# Patient Record
Sex: Female | Born: 2000
Health system: Southern US, Community
[De-identification: ages and names within clinical notes are randomized; demographics above are authoritative.]

## PROBLEM LIST (undated history)

## (undated) ENCOUNTER — Inpatient Hospital Stay (HOSPITAL_COMMUNITY): Payer: Commercial Managed Care - PPO

## (undated) DIAGNOSIS — B009 Herpesviral infection, unspecified: Secondary | ICD-10-CM

## (undated) DIAGNOSIS — F4320 Adjustment disorder, unspecified: Secondary | ICD-10-CM

## (undated) DIAGNOSIS — E109 Type 1 diabetes mellitus without complications: Secondary | ICD-10-CM

## (undated) HISTORY — DX: Herpesviral infection, unspecified: B00.9

## (undated) HISTORY — DX: Type 1 diabetes mellitus without complications: E10.9

## (undated) HISTORY — PX: WISDOM TOOTH EXTRACTION: SHX21

---
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
## 2019-01-01 DIAGNOSIS — F322 Major depressive disorder, single episode, severe without psychotic features: Secondary | ICD-10-CM | POA: Diagnosis not present

## 2019-01-01 LAB — GLUCOSE, CAPILLARY
Glucose-Capillary: 127 mg/dL — ABNORMAL HIGH (ref 70–99)
Glucose-Capillary: 220 mg/dL — ABNORMAL HIGH (ref 70–99)
Glucose-Capillary: 96 mg/dL (ref 70–99)
Glucose-Capillary: 90 mg/dL (ref 70–99)
Glucose-Capillary: 99 mg/dL (ref 70–99)

## 2019-01-01 MED ORDER — NICOTINE POLACRILEX 2 MG MT GUM
2.0000 mg | CHEWING_GUM | OROMUCOSAL | Status: DC | PRN
  Administered 2019-01-01 – 2019-01-02 (×4): 2 mg via ORAL
  Filled 2019-01-01: qty 1

## 2019-01-01 NOTE — Progress Notes (Addendum)
Patient ID: Lydia Estrada, female   DOB: 15-Dec-2000, 18 y.o.   MRN: [id number] Pt refilled insulin pump with  200 unit humalog pen cbg am 220 New pump placed on left arm

## 2019-01-01 NOTE — Progress Notes (Signed)
Patient's record reviewed remotely. Your control is much better. No hypoglycemic episodes documented. Continue current regimen of insulin therapy, and continue calorie count. Further management will depend on above.

## 2019-01-01 NOTE — [facility] Group Notes (Signed)
LCSW Group Therapy Note  01/01/2019    10:00-11:00am   Type of Therapy and Topic:  Group Therapy: Anger and Coping Skills  Participation Level:  Did Not Attend   Description of Group:   In this group, patients learned how to recognize the physical, cognitive, emotional, and behavioral responses they have to anger-provoking situations.  They identified how they usually or often react when angered, and learned how healthy and unhealthy coping skills work initially, but the unhealthy ones stop working.   They analyzed how their frequently-chosen coping skill is possibly beneficial and how it is possibly unhelpful.  The group discussed a variety of healthier coping skills that could help in resolving the actual issues, as well as how to go about planning for the the possibility of future similar situations.  Therapeutic Goals: 1. Patients will identify one thing that makes them angry and how they feel emotionally and physically, what their thoughts are or tend to be in those situations, and what healthy or unhealthy coping mechanism they typically use 2. Patients will identify how their coping technique works for them, as well as how it works against them. 3. Patients will explore possible new behaviors to use in future anger situations. 4. Patients will learn that anger itself is normal and cannot be eliminated, and that healthier coping skills can assist with resolving conflict rather than worsening situations.  Summary of Patient Progress:  The patient was invited to attend group, but chose not to [place].  Therapeutic Modalities:   Cognitive Behavioral Therapy Motivation Interviewing  Maretta Los  .
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
## 2019-01-02 LAB — GLUCOSE, CAPILLARY
Glucose-Capillary: 187 mg/dL — ABNORMAL HIGH (ref 70–99)
Glucose-Capillary: 106 mg/dL — ABNORMAL HIGH (ref 70–99)
Glucose-Capillary: 55 mg/dL — ABNORMAL LOW (ref 70–99)
Glucose-Capillary: 103 mg/dL — ABNORMAL HIGH (ref 70–99)
Glucose-Capillary: 202 mg/dL — ABNORMAL HIGH (ref 70–99)
Glucose-Capillary: 136 mg/dL — ABNORMAL HIGH (ref 70–99)

## 2019-01-02 NOTE — Progress Notes (Signed)
   01/02/19 0023  Psych Admission Type (Psych Patients Only)  Admission Status Voluntary  Psychosocial Assessment  Patient Complaints Anxiety  Eye Contact Brief  Facial Expression Anxious  Affect Appropriate to circumstance  Speech Logical/coherent  Interaction Assertive;Defensive  Motor Activity Slow  Appearance/Hygiene Unremarkable  Behavior Characteristics Cooperative  Mood Anxious  Aggressive Behavior  Effect No apparent injury  Thought Process  Coherency WDL  Content WDL  Delusions None reported or observed  Perception WDL  Hallucination None reported or observed  Judgment Impaired  Confusion None  Danger to Self  Current suicidal ideation? Denies  Self-Injurious Behavior No self-injurious ideation or behavior indicators observed or expressed   Agreement Not to Harm Self Yes  Description of Agreement Verbally contracts for safety  Danger to Others  Danger to Others None reported or observed  D: Patient in dayroom reports she had a good day.  A: Medications administered as prescribed. Support and encouragement provided as needed.  R: Patient remains safe on the unit. Will continue to monitor for safety and stability.

## 2019-01-02 NOTE — Progress Notes (Addendum)
   01/02/19 1800  Psych Admission Type (Psych Patients Only)  Admission Status Voluntary  Psychosocial Assessment  Patient Complaints Anxiety  Eye Contact Brief  Facial Expression Anxious  Affect Appropriate to circumstance  Speech Logical/coherent  Interaction Assertive  Motor Activity Slow  Appearance/Hygiene Unremarkable  Behavior Characteristics Cooperative  Mood Pleasant  Aggressive Behavior  Effect No apparent injury  Thought Process  Coherency WDL  Content WDL  Delusions None reported or observed  Perception WDL  Hallucination None reported or observed  Judgment Impaired  Confusion None  Danger to Self  Current suicidal ideation? Denies  Self-Injurious Behavior No self-injurious ideation or behavior indicators observed or expressed   Agreement Not to Harm Self Yes  Description of Agreement Verbally contracts for safety  Danger to Others  Danger to Others None reported or observed
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
## 2019-01-02 NOTE — Progress Notes (Signed)
Psychoeducational Group Note  Date:  01/02/2019 Time: 2030 Group Topic/Focus:  wrap up group  Participation Level: Did Not Attend  Participation Quality:  Not Applicable  Affect:  Not Applicable  Cognitive:  Not Applicable  Insight:  Not Applicable  Engagement in Group: Not Applicable  Additional Comments: Pt left group shortly after it began. Pt reported only wanting to sit in group and observe. Pt was encouraged to minimally participate but pt left group and returned to her room.   Winfield Rast S 01/02/2019, 10:03 PM

## 2019-01-02 NOTE — [facility]HH Group Notes (Signed)
[place] LCSW Group Therapy Note  01/02/2019  10:00-11:00AM  Type of Therapy and Topic:  Group Therapy:  A Hero Worthy of Support  Participation Level:  Did Not Attend   Description of Group:  Patients in this group were introduced to the concept that additional supports including self-support are an essential part of recovery.  Matching needs with supports to help fulfill those needs was explained.  A song "I Got To Live" was played for the group and was followed by a discussion of what it meant to participants.   The consensus was that the message was to give themselves permission to see happiness in life.  A song entitled "My Own Hero" was played and a group discussion ensued in which patients stated it inspired them to help themselves in order to succeed, because other people cannot achieve their goals such as sobriety or stability for them.  A song was played called "I Am Enough" which led to a discussion about being willing to believe we are worth the effort of being a self-support.   Therapeutic Goals: 1)  demonstrate the importance of being a key part of one's own support system 2)  discuss various available supports 3)  encourage patient to use music as part of their self-support and focus on goals 4)  elicit ideas from patients about supports that need to be added   Summary of Patient Progress:  The patient was invited to attend, chose not to do so.  Therapeutic Modalities:   Motivational Interviewing Activity  Maretta Los

## 2019-01-02 NOTE — Progress Notes (Signed)
Patient's chart reviewed. Patient is being followed peripherally.   Blood sugar is well controlled. Continue current regimen.
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
## 2019-01-02 NOTE — Progress Notes (Signed)
Hypoglycemic Event  CBG: 50  Treatment: 8 oz juice/soda  Symptoms: Hungry and cold  Follow-up CBG: Time:1215 CBG Result:103    Waymond Cera

## 2019-01-03 LAB — GLUCOSE, CAPILLARY
Glucose-Capillary: 32 mg/dL — CL (ref 70–99)
Glucose-Capillary: 40 mg/dL — CL (ref 70–99)
Glucose-Capillary: 132 mg/dL — ABNORMAL HIGH (ref 70–99)
Glucose-Capillary: 165 mg/dL — ABNORMAL HIGH (ref 70–99)
Glucose-Capillary: 211 mg/dL — ABNORMAL HIGH (ref 70–99)
Glucose-Capillary: 228 mg/dL — ABNORMAL HIGH (ref 70–99)

## 2019-01-03 MED ORDER — INSULIN GLARGINE 100 UNIT/ML ~~LOC~~ SOLN: 18.0000 [IU] | Freq: Every day | SUBCUTANEOUS | Status: DC

## 2019-01-03 MED ORDER — GLUCERNA SHAKE PO LIQD
237.0000 mL | Freq: Three times a day (TID) | ORAL | Status: DC
  Administered 2019-01-03: 16:00:00 237 mL via ORAL

## 2019-01-03 MED ORDER — NICOTINE POLACRILEX 2 MG MT GUM: 2.0000 mg | CHEWING_GUM | OROMUCOSAL | Status: DC | PRN

## 2019-01-03 MED ORDER — GLUCOSE 40 % PO GEL
1.0000 | Freq: Once | ORAL | Status: AC
  Administered 2019-01-03: 07:00:00 37.5 g via ORAL
  Filled 2019-01-03: qty 1

## 2019-01-03 MED ORDER — GLUCOSE 40 % PO GEL
ORAL | Status: AC
  Administered 2019-01-03: 06:00:00
  Filled 2019-01-03: qty 1

## 2019-01-03 MED ORDER — NICOTINE 21 MG/24HR TD PT24
21.0000 mg | MEDICATED_PATCH | Freq: Every day | TRANSDERMAL | Status: DC
  Administered 2019-01-03 – 2019-01-04 (×2): 21 mg via TRANSDERMAL
  Filled 2019-01-03 (×4): qty 1

## 2019-01-03 MED ORDER — GLUCAGON HCL RDNA (DIAGNOSTIC) 1 MG IJ SOLR
INTRAMUSCULAR | Status: AC
  Filled 2019-01-03: qty 1

## 2019-01-03 MED ORDER — GLUCAGON HCL RDNA (DIAGNOSTIC) 1 MG IJ SOLR
1.0000 mg | Freq: Once | INTRAMUSCULAR | Status: DC
  Filled 2019-01-03: qty 1

## 2019-01-03 NOTE — Progress Notes (Signed)
Recreation Therapy Notes  Date:  11.2.20 Time: 0930 Location: 300 Hall Group Room  Group Topic: Stress Management  Goal Area(s) Addresses:  Patient will identify positive stress management techniques. Patient will identify benefits of using stress management post d/c.  Intervention: Stress Management  Activity :  Meditation.  LRT introduced the stress management technique of meditation.  LRT played a meditation that focused on making the most of your day.  Patients were to listen and follow along as meditation was played to fully engage in activity.  Education:  Stress Management, Discharge Planning.   Education Outcome: Acknowledges Education  Clinical Observations/Feedback:  Patient did not attend group.    Victorino Sparrow, LRT/CTRS         Ria Comment, Hadlea Furuya A 01/03/2019 11:30 AM

## 2019-01-03 NOTE — Progress Notes (Signed)
The patient was redirected for entering the wrong bedroom (Room 301) in order to talk. The patient had no response.

## 2019-01-03 NOTE — Tx Team (Signed)
Interdisciplinary Treatment and Diagnostic Plan Update  01/03/2019 Time of Session: 9:20am Lydia Estrada MRN: [id number]  Principal Diagnosis: Type 1 diabetes mellitus without complication (HCC)  Secondary Diagnoses: Principal Problem:   Type 1 diabetes mellitus without complication (HCC) Active Problems:   Severe recurrent major depression without psychotic features (HCC)   Current Medications:  Current Facility-Administered Medications  Medication Dose Route Frequency Provider Last Rate Last Dose  . acetaminophen (TYLENOL) tablet 650 mg  650 mg Oral Q6H PRN Anike, Adaku C, NP   650 mg at 12/31/18 0915  . glucagon (human recombinant) (GLUCAGEN) injection 1 mg  1 mg Subcutaneous Once Nira Conn A, NP      . insulin glargine (LANTUS) injection 24 Units  24 Units Subcutaneous Daily Berton Mount I, MD   24 Units at 01/03/19 [phone]  . Insulin Lispro SOPN 1 pen  1 pen Subcutaneous Once Cobos, Fernando A, MD      . insulin pump   Subcutaneous TID AC & HS Berton Mount I, MD      . LORazepam (ATIVAN) tablet 0.5 mg  0.5 mg Oral Q6H PRN Cobos, Rockey Situ, MD   0.5 mg at 01/03/19 0819  . nicotine (NICODERM CQ - dosed in mg/24 hours) patch 21 mg  21 mg Transdermal Daily Cobos, Rockey Situ, MD      . norgestrel-ethinyl estradiol (LO/OVRAL) 0.3-30 MG-MCG per tablet 1 tablet  1 tablet Oral QHS Cobos, Rockey Situ, MD   1 tablet at 01/02/19 2154  . sertraline (ZOLOFT) tablet 50 mg  50 mg Oral Daily Cobos, Rockey Situ, MD   50 mg at 01/03/19 5784  . traZODone (DESYREL) tablet 50 mg  50 mg Oral QHS PRN Cobos, Rockey Situ, MD   50 mg at 01/02/19 2153   PTA Medications: Medications Prior to Admission  Medication Sig Dispense Refill Last Dose  . acetone, urine, test strip Check ketones per protocol 50 each 3   . Blood Glucose Monitoring Suppl (FREESTYLE LITE) DEVI USE BLOOD GLUCOSE METER TO CHECK BLOOD GLUCOSE 10 TIMES DAY 2 each 2   . ELINEST 0.3-30 MG-MCG tablet Take 0.3 mg by mouth at bedtime.      Marland Kitchen EPINEPHrine 0.3 mg/0.3 mL IJ SOAJ injection Inject 0.3 mg into the muscle once as needed for allergies.     Marland Kitchen glucagon 1 MG injection Follow package directions for low blood sugar. 2 each 6   . glucose blood (FREESTYLE LITE) test strip USE TO TEST BLOOD SUGAR 6 TIMES PER DAY. 200 strip 5   . HUMALOG KWIKPEN 200 UNIT/ML SOPN Inject 200 Units into the skin daily. 30 mL 6   . Lancets (FREESTYLE) lancets USE TO TEST BLOOD SUGAR 6 TIMES PER DAY. 200 each 5   . LANTUS SOLOSTAR 100 UNIT/ML Solostar Pen Inject up to 75 units daily per MD order. (Patient taking differently: 61 Units. ) 30 mL 11   . metFORMIN (GLUCOPHAGE-XR) 500 MG 24 hr tablet Take 3 tablets (1,500 mg total) by mouth daily with breakfast. 93 tablet 6     Patient Stressors: Health problems Medication change or noncompliance  Patient Strengths: Average or above average intelligence Communication skills Supportive family/friends  Treatment Modalities: Medication Management, Group therapy, Case management,  1 to 1 session with clinician, Psychoeducation, Recreational therapy.   Physician Treatment Plan for Primary Diagnosis: Type 1 diabetes mellitus without complication (HCC) Long Term Goal(s): Improvement in symptoms so as ready for discharge Improvement in symptoms so as ready for discharge  Short Term Goals: Ability to identify changes in lifestyle to reduce recurrence of condition will improve Ability to verbalize feelings will improve Ability to disclose and discuss suicidal ideas Ability to demonstrate self-control will improve Ability to identify and develop effective coping behaviors will improve Ability to maintain clinical measurements within normal limits will improve Ability to identify changes in lifestyle to reduce recurrence of condition will improve Ability to verbalize feelings will improve Ability to disclose and discuss suicidal ideas Ability to demonstrate self-control will improve Ability to identify  and develop effective coping behaviors will improve Ability to maintain clinical measurements within normal limits will improve  Medication Management: Evaluate patient's response, side effects, and tolerance of medication regimen.  Therapeutic Interventions: 1 to 1 sessions, Unit Group sessions and Medication administration.  Evaluation of Outcomes: Progressing  Physician Treatment Plan for Secondary Diagnosis: Principal Problem:   Type 1 diabetes mellitus without complication ([place]) Active Problems:   Severe recurrent major depression without psychotic features ([place])  Long Term Goal(s): Improvement in symptoms so as ready for discharge Improvement in symptoms so as ready for discharge   Short Term Goals: Ability to identify changes in lifestyle to reduce recurrence of condition will improve Ability to verbalize feelings will improve Ability to disclose and discuss suicidal ideas Ability to demonstrate self-control will improve Ability to identify and develop effective coping behaviors will improve Ability to maintain clinical measurements within normal limits will improve Ability to identify changes in lifestyle to reduce recurrence of condition will improve Ability to verbalize feelings will improve Ability to disclose and discuss suicidal ideas Ability to demonstrate self-control will improve Ability to identify and develop effective coping behaviors will improve Ability to maintain clinical measurements within normal limits will improve     Medication Management: Evaluate patient's response, side effects, and tolerance of medication regimen.  Therapeutic Interventions: 1 to 1 sessions, Unit Group sessions and Medication administration.  Evaluation of Outcomes: Progressing   RN Treatment Plan for Primary Diagnosis: Type 1 diabetes mellitus without complication ([place]) Long Term Goal(s): Knowledge of disease and therapeutic regimen to maintain health will improve  Short Term  Goals: Ability to participate in decision making will improve, Ability to disclose and discuss suicidal ideas, Ability to identify and develop effective coping behaviors will improve and Compliance with prescribed medications will improve  Medication Management: RN will administer medications as ordered by provider, will assess and evaluate patient's response and provide education to patient for prescribed medication. RN will report any adverse and/or side effects to prescribing provider.  Therapeutic Interventions: 1 on 1 counseling sessions, Psychoeducation, Medication administration, Evaluate responses to treatment, Monitor vital signs and CBGs as ordered, Perform/monitor CIWA, COWS, AIMS and Fall Risk screenings as ordered, Perform wound care treatments as ordered.  Evaluation of Outcomes: Progressing   LCSW Treatment Plan for Primary Diagnosis: Type 1 diabetes mellitus without complication ([place]) Long Term Goal(s): Safe transition to appropriate next level of care at discharge, Engage patient in therapeutic group addressing interpersonal concerns.  Short Term Goals: Engage patient in aftercare planning with referrals and resources  Therapeutic Interventions: Assess for all discharge needs, 1 to 1 time with Social worker, Explore available resources and support systems, Assess for adequacy in community support network, Educate family and significant other(s) on suicide prevention, Complete Psychosocial Assessment, Interpersonal group therapy.  Evaluation of Outcomes: Progressing   Progress in Treatment: Attending groups: Yes. Participating in groups: Yes. Taking medication as prescribed: Yes. Toleration medication: Yes. Family/Significant other contact made: Yes, individual(s) contacted:  the patient's mother Patient understands diagnosis: Yes. Discussing patient identified problems/goals with staff: Yes. Medical problems stabilized or resolved: Yes. Denies suicidal/homicidal ideation:  Yes.  Issues/concerns per patient self-inventory: No. Other:   New problem(s) identified: None   New Short Term/Long Term Goal(s):medication stabilization, elimination of SI thoughts, development of comprehensive mental wellness plan.    Patient Goals: "To find better ways of coping. It has been hard dealing with things"   Discharge Plan or Barriers: Patient recently admitted. CSW will continue to follow and assess for appropriate referrals and possible discharge planning.    Reason for Continuation of Hospitalization: Depression Medication stabilization Suicidal ideation  Estimated Length of Stay: 1-2 days   Attendees: Patient: Lydia Estrada  01/03/2019 9:26 AM  Physician: Dr. Nehemiah MassedFernando Cobos, MD 01/03/2019 9:26 AM  Nursing: Meriam SpragueBeverly.Kirtland BouchardK, RN 01/03/2019 9:26 AM  RN Care Manager: 01/03/2019 9:26 AM  Social Worker: Baldo DaubJolan Mary Hockey, LCSW 01/03/2019 9:26 AM  Recreational Therapist:  01/03/2019 9:26 AM  Other: Marciano SequinJanet Sykes, NP  01/03/2019 9:26 AM  Other:  01/03/2019 9:26 AM  Other: 01/03/2019 9:26 AM    Scribe for Treatment Team: Maeola SarahJolan E Norlan Rann, LCSWA 01/03/2019 9:26 AM

## 2019-01-03 NOTE — Progress Notes (Addendum)
Patient records reviewed. Blood sugar was 55 at 12.02 PM yesterday.   Discussed with psychiatrist, Dr. Parke Poisson.  Continue to monitor caloric count. Blood sugar of 32 noted around 6.05 AM today. Please continue to check blood sugar at 2 AM even if no coverage is planned. Decrease a.m. Lantus to 18 units once daily. Continue to monitor blood sugar closely, as well as, caloric count. Please document caloric count in patient's chart Further management depend on above.  Addendum: Addendum is done on 01/03/2019 at 2:49 PM.  After discussing with the patient's primary attending a psychiatrist, Dr. Parke Poisson, will agree to discontinue the extra subcutaneous Lantus insulin.  Patient gets basal insulin via the insulin pump.

## 2019-01-03 NOTE — Progress Notes (Addendum)
1700  Patient ate salad, 6 carbs;  Pasta 11 carbs;   Apple juice 8 carbs,   Diet dr.pepper zero carbs;   1% low fat chocolate milk 24 carbs;  carbs total 39.   Total carbs 39 + 9.75   CBG 211 = 2.3 Total 12.05 units

## 2019-01-03 NOTE — Progress Notes (Signed)
The patient attended group but did not feel comfortable with sharing.

## 2019-01-03 NOTE — Progress Notes (Addendum)
Inpatient Diabetes Program Recommendations  AACE/ADA: New Consensus Statement on Inpatient Glycemic Control (2015)  Target Ranges:  Prepandial:   less than 140 mg/dL      Peak postprandial:   less than 180 mg/dL (1-2 hours)      Critically ill patients:  140 - 180 mg/dL   Results for Lydia Estrada, Lydia Estrada (MRN [id number]) as of 01/03/2019 07:14  Ref. Range 01/02/2019 02:45 01/02/2019 06:15 01/02/2019 12:02 01/02/2019 12:34 01/02/2019 16:57 01/02/2019 20:45  Glucose-Capillary Latest Ref Range: 70 - 99 mg/dL 187 (H) 106 (H)   24 units LANTUS given at 8am 55 (L) 103 (H) 202 (H)  7.05 units given per Insulin Pump 136 (H)  3.85 units given per Insulin Pump   Results for Lydia Estrada, Lydia Estrada (MRN [id number]) as of 01/03/2019 07:14  Ref. Range 01/03/2019 06:05 01/03/2019 06:17 01/03/2019 06:36  Glucose-Capillary Latest Ref Range: 70 - 99 mg/dL 32 (LL) 40 (LL) 132 (H)     Home DM Meds: Omni Pod Insulin Pump  Current Orders: Omni Pod Insulin Pump      Lantus 24 units Daily      MD- Patient with Severe Hypoglycemia this AM.   Please reduce Lantus the Lantus to 10 units Daily.   May want to call Dr. Loren Racer office (Peds ENDO) and ask for further recommendations.    --Will follow patient during hospitalization--  Wyn Quaker RN, MSN, CDE Diabetes Coordinator Inpatient Glycemic Control Team Team Pager: [phone] (8a-5p)

## 2019-01-03 NOTE — Progress Notes (Signed)
Nutrition Note  48 hour Calorie Count ordered per MD 10/30. Pt with type 1 diabetes with recurrent hypoglycemic episodes.  RD unable to calculate calorie counts, no access to PO documentation. RD working remotely given COVID-19 restrictions.  If staff can document in flowsheets, can calculate from that documentation. Will also order Glucerna shakes if patient is not completing meals.  Weights have been trending down.   Wt Readings from Last 15 Encounters:  12/28/18 (!) 149.7 kg (>99 %, Z= 2.89)*  11/24/18 (!) 151.5 kg (>99 %, Z= 2.89)*  08/27/18 (!) 151.5 kg (>99 %, Z= 2.87)*  05/21/18 (!) 147.8 kg (>99 %, Z= 2.82)*  02/04/18 (!) 141.5 kg (>99 %, Z= 2.76)*  10/22/17 (!) 150.5 kg (>99 %, Z= 2.82)*  09/29/17 (!) 154.9 kg (>99 %, Z= 2.84)*  09/18/17 (!) 152.9 kg (>99 %, Z= 2.83)*  07/16/17 (!) 153.3 kg (>99 %, Z= 2.83)*  05/14/17 (!) 156 kg (>99 %, Z= 2.85)*  05/14/17 (!) 156 kg (>99 %, Z= 2.85)*  04/13/17 (!) 160.1 kg (>99 %, Z= 2.88)*  04/13/17 (!) 160.1 kg (>99 %, Z= 2.88)*  04/01/17 (!) 167.8 kg (>99 %, Z= 2.92)*   * Growth percentiles are based on CDC (Girls, 2-20 Years) data.    Body mass index is 50.18 kg/m. Patient meets criteria for obesity based on current BMI for age. Labs and medications reviewed.   Clayton Bibles, MS, RD, LDN Inpatient Clinical Dietitian Pager: [phone] After Hours Pager: [phone]

## 2019-01-03 NOTE — Progress Notes (Signed)
Spiritual care group on grief and loss facilitated by chaplain Jerene Pitch MDiv, [facility]  Group Goal:  Support / Education around grief and loss Members engage in facilitated group support and psycho-social education.  Group Description:  Following introductions and group rules, group members engaged in facilitated group dialog and support around topic of loss, with particular support around experiences of loss in their lives. Group Identified types of loss (relationships / self / things) and identified patterns, circumstances, and changes that precipitate losses. Reflected on thoughts / feelings around loss, normalized grief responses, and recognized variety in grief experience.   Group noted Worden's four tasks of grief in discussion.  Group drew on Adlerian / Rogerian, narrative MI, Patient Progress:  Celestial arrived to group late.  Was engaged in group discussion.  Noted how her experience growing up with 7 brothers affects her internal responses to grief.  She states she feels she is supposed to be "tough" and spoke with group about her awareness of this narrative and her work to recognize and speak back to this narrative.    Jerene Pitch, MDiv, [facility]

## 2019-01-03 NOTE — [facility] Group Notes (Signed)
LCSW Group Therapy Notes 01/03/2019 2:20 PM  Type of Therapy and Topic: Group Therapy: Overcoming Obstacles  Participation Level: Did Not Attend  Description of Group:  In this group patients will be encouraged to explore what they see as obstacles to their own wellness and recovery. They will be guided to discuss their thoughts, feelings, and behaviors related to these obstacles. The group will process together ways to cope with barriers, with attention given to specific choices patients can make. Each patient will be challenged to identify changes they are motivated to make in order to overcome their obstacles. This group will be process-oriented, with patients participating in exploration of their own experiences as well as giving and receiving support and challenge from other group members.  Therapeutic Goals: 1. Patient will identify personal and current obstacles as they relate to admission. 2. Patient will identify barriers that currently interfere with their wellness or overcoming obstacles.  3. Patient will identify feelings, thought process and behaviors related to these barriers. 4. Patient will identify two changes they are willing to make to overcome these obstacles:   Summary of Patient Progress N/A, patient was meeting with CSW and psychiatry  Therapeutic Modalities:  Cognitive Behavioral Therapy Solution Focused Therapy Motivational Interviewing Relapse Prevention Therapy  Stephanie Acre, MSW, [facility] 01/03/2019 2:20 PM
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
## 2019-01-04 DIAGNOSIS — F332 Major depressive disorder, recurrent severe without psychotic features: Principal | ICD-10-CM

## 2019-01-04 DIAGNOSIS — E109 Type 1 diabetes mellitus without complications: Secondary | ICD-10-CM

## 2019-01-04 LAB — GLUCOSE, CAPILLARY
Glucose-Capillary: 224 mg/dL — ABNORMAL HIGH (ref 70–99)
Glucose-Capillary: 57 mg/dL — ABNORMAL LOW (ref 70–99)
Glucose-Capillary: 69 mg/dL — ABNORMAL LOW (ref 70–99)
Glucose-Capillary: 162 mg/dL — ABNORMAL HIGH (ref 70–99)
Glucose-Capillary: 56 mg/dL — ABNORMAL LOW (ref 70–99)

## 2019-01-04 MED ORDER — HYDROXYZINE HCL 25 MG PO TABS: 25.0000 mg | ORAL_TABLET | Freq: Three times a day (TID) | ORAL | 0 refills | Status: DC | PRN

## 2019-01-04 MED ORDER — TRAZODONE HCL 50 MG PO TABS: 50.0000 mg | ORAL_TABLET | Freq: Every evening | ORAL | 0 refills | Status: DC | PRN

## 2019-01-04 MED ORDER — HYDROXYZINE HCL 25 MG PO TABS: 25.0000 mg | ORAL_TABLET | Freq: Three times a day (TID) | ORAL | Status: DC | PRN

## 2019-01-04 MED ORDER — NICOTINE 21 MG/24HR TD PT24: 21.0000 mg | MEDICATED_PATCH | Freq: Every day | TRANSDERMAL | 0 refills | Status: DC

## 2019-01-04 MED ORDER — SERTRALINE HCL 50 MG PO TABS: 50.0000 mg | ORAL_TABLET | Freq: Every day | ORAL | 0 refills | Status: DC

## 2019-01-04 MED ORDER — INSULIN PUMP: 1.0000 | Freq: Three times a day (TID) | SUBCUTANEOUS | Status: DC

## 2019-01-04 MED FILL — traZODone HCL 50 MG TABS: 50 | 30 days supply | Qty: 30 | Fill #0

## 2019-01-04 MED FILL — LANTUS SOLOSTAR 100 UNITS/M: 100 | 40 days supply | Qty: 30 | Fill #7

## 2019-01-04 MED FILL — NICOTINE 21 MG/24HR PATCH: 21 | 28 days supply | Qty: 28 | Fill #0

## 2019-01-04 MED FILL — SERTRALINE HCL 50 MG TABLET: 50 | 30 days supply | Qty: 30 | Fill #0

## 2019-01-04 MED FILL — hydrOXYzine HCL 25 MG TABS: 25 | 20 days supply | Qty: 60 | Fill #0

## 2019-01-04 NOTE — Progress Notes (Signed)
Discharge Note:  Patient discharged home with mother.  Patient denied SI and HI.  Denied A/V hallucinations.  Suicide prevention information given and discussed with patient who stated she understood and had no questions.  Patient stated she appreciated all assistance received from [facility] staff.  Patient stated she received all her belongings, clothing, toiletries, diabetic equipment, etc.  All required discharge information given to patient at discharge.

## 2019-01-04 NOTE — Progress Notes (Signed)
0810  Patient's CBG was 162.  Patient ate breakfast, 15 carbs;   chocolate milk 1/2;  Total carbs 24.  Ate hash browns and 1/2 of chocolate milk.   Pt ate 15 carbs  =  3.75 units CBG 162  =  0.9 units Total 4.65 units

## 2019-01-04 NOTE — Progress Notes (Signed)
1300  Patient ate small salad, chicken and sauce, chocolate milk, total 53 carbs CBG 56 =  Total of 7 units

## 2019-01-04 NOTE — Progress Notes (Signed)
CSW spoke with mother at the patient's request to facilitate discharge planning. Mother, Lydia Estrada [phone]), reports she was expecting a family meeting prior to discharge to be "aware of the plan." CSW shared patient's outpatient follow up appointment with mother.  Mother expressed general concerns for the patient's discharge and behaviors. Mother has set expectations and boundaries for the patient, if she wants to return to the family home. Mother shares that last night, the patient called and stated she was not going to follow the rules and if she was made to follow the rules, she would kill herself. Mother confirms the patient called again this morning and they had a conversation in which the patient agreed to follow the rules and expectations established by her parents.  Mother states that because the patient has called her daily while at [facility], and raised her voice and used derogatory language, that the patient is "still in crisis."   Mother agreed that the patient is able to return home today if the patient is psychiatrically cleared for discharge.   Additional information was shared with psychiatry. This patient is still appropriate for discharge and contracts for safety.  Patient's mother called and informed, mother will pick up the patient for discharge at [place], [place] Worker

## 2019-01-04 NOTE — Progress Notes (Signed)
D::  Patient's self inventory sheet, patient sleeps good, sleep medication helpful.  Fair appetite, normal energy level, poor concentration.  Rated depression 9, hopeless and anxiety 10.  Denied withdrawals.  SI, contracts for safety.  Physical problems.  Headaches.  Physical pain, pain medication is helpful.  Goal is find out where to live.  Plans to talk to MD.  No discharge plans. A:  Medications administered per MD orders.  Emotional support and encouragement given patient. R:  Denied HI, contracts for safety.  Denied A/V hallucinations.  SI sometimes, contracts for safety.
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
## 2019-01-04 NOTE — Progress Notes (Signed)
Inpatient Diabetes Program Recommendations  AACE/ADA: New Consensus Statement on Inpatient Glycemic Control (2015)  Target Ranges:  Prepandial:   less than 140 mg/dL      Peak postprandial:   less than 180 mg/dL (1-2 hours)      Critically ill patients:  140 - 180 mg/dL   Results for Lydia Estrada, Lydia Estrada (MRN [id number]) as of 01/04/2019 06:54  Ref. Range 01/03/2019 06:05 01/03/2019 06:17 01/03/2019 06:36 01/03/2019 11:47 01/03/2019 17:59 01/03/2019 20:37  Glucose-Capillary Latest Ref Range: 70 - 99 mg/dL 32 (LL) 40 (LL) 132 (H)  2.55 units given per Insulin Pump +  24 units LANTUS given at 8am 165 (H)  9.75 units given per Insulin Pump 211 (H)  12.05 nuits given per Insulin Pump 228 (H)   Results for Lydia Estrada, Lydia Estrada (MRN [id number]) as of 01/04/2019 06:54  Ref. Range 01/04/2019 02:06 01/04/2019 06:19 01/04/2019 06:45  Glucose-Capillary Latest Ref Range: 70 - 99 mg/dL 224 (H) 57 (L) 69 (L)    Home DM Meds: Omni Pod Insulin Pump  Current Orders: Omni Pod Insulin Pump      Severe Hypoglycemia yesterday AM.  Hypoglycemia again this AM.    MD -Note that patient received 24 units Lantus yesterday at 8am.    Note that Lantus discontinued late yesterday afternoon and patient will not get any Lantus today.  Agree with d/c of Lantus for now.  If additional Insulin adjustments are needed, may need to contact Lydia Bers, NP with patient's Endocrinology office. Lydia Bers, NP       [facility]      [phone]     --Will follow patient during hospitalization--  Wyn Quaker RN, MSN, CDE Diabetes Coordinator Inpatient Glycemic Control Team Team Pager: [phone] (8a-5p)

## 2019-01-04 NOTE — Progress Notes (Signed)
Patient's blood sugar was 57 this am. Patient was asymptomatic. Her blood sugar was checked at 2am and it was 224. Re-checked at 6:43 and it was 69. Patient will be able to get over 70 by eating her breakfast. On-call notified. Basal rate probably needs adjusting at night. No bolused insulin given this shift

## 2019-01-04 NOTE — Progress Notes (Signed)
Patient ID: Lydia Estrada, female   DOB: [birth year], 18 y.o.   MRN: [id number]  D: Patient pleasant on approach tonight but reports that she has been upset tonight due to her parents telling her that she cannot live there anymore. No active SI at this time. Reports her mood was improving until she was told this. A: Staff will monitor on q 15 minute checks, follow treatment plan, and give medications as ordered. R: Cooperative on the unit

## 2019-01-04 NOTE — BHH Suicide Risk Assessment (Signed)
[facility] Discharge Suicide Risk Assessment   Principal Problem: Type 1 diabetes mellitus without complication ([place]) Discharge Diagnoses: Principal Problem:   Type 1 diabetes mellitus without complication ([place]) Active Problems:   Severe recurrent major depression without psychotic features ([place])   Total Time spent with patient: 20 minutes  Musculoskeletal: Strength & Muscle Tone: within normal limits Gait & Station: normal Patient leans: N/A  Psychiatric Specialty Exam: Review of Systems  All other systems reviewed and are negative.   Blood pressure (!) 144/122, pulse (!) 109, temperature 98.5 F (36.9 C), temperature source Oral, resp. rate 20, height 5\' 8"  (1.727 m), weight (!) 149.7 kg, SpO2 97 %.Body mass index is 50.18 kg/m.  General Appearance: Casual  Eye Contact::  Fair  Speech:  Normal Rate409  Volume:  Normal  Mood:  Anxious  Affect:  Congruent  Thought Process:  Coherent and Descriptions of Associations: Intact  Orientation:  Full (Time, Place, and Person)  Thought Content:  Logical  Suicidal Thoughts:  No  Homicidal Thoughts:  No  Memory:  Immediate;   Fair Recent;   Fair Remote;   Fair  Judgement:  Intact  Insight:  Fair  Psychomotor Activity:  Increased  Concentration:  Fair  Recall:  [employer] of Knowledge:Good  Language: Good  Akathisia:  Negative  Handed:  Right  AIMS (if indicated):     Assets:  Desire for Improvement Resilience  Sleep:  Number of Hours: 3.5  Cognition: WNL  ADL's:  Intact   Mental Status Per Nursing Assessment::   On Admission:  Suicidal ideation indicated by patient, Self-harm thoughts  Demographic Factors:  Adolescent or young adult, Caucasian and Unemployed  Loss Factors: NA  Historical Factors: Impulsivity  Risk Reduction Factors:   Living with another person, especially a relative  Continued Clinical Symptoms:  Depression:   Impulsivity  Cognitive Features That Contribute To Risk:  None    Suicide Risk:   Minimal: No identifiable suicidal ideation.  Patients presenting with no risk factors but with morbid ruminations; may be classified as minimal risk based on the severity of the depressive symptoms  Follow-up [facility] Follow up on 01/11/2019.   Specialty: Behavioral Health Why: Therapy appointment with Oneita Kras is Tuesday, 11/10 at 1:00p.  Appt will be virtual or on the phone. The office will contact you.  Contact information: [place] [phone]          Plan Of Care/Follow-up recommendations:  Activity:  ad lib  Sharma Covert, MD 01/04/2019, 9:14 AM

## 2019-01-04 NOTE — Progress Notes (Signed)
[facility] Adult Case Management Discharge Plan :  Will you be returning to the same living situation after discharge:  Yes,  patient reports she is returning home with her parents At discharge, do you have transportation home?: Yes,  patient's older sister is picking her up at discharge Do you have the ability to pay for your medications: Yes,  [facility]  Release of information consent forms completed and in the chart;  Patient's signature needed at discharge.  Patient to Follow up at: Follow-up [facility] Follow up on 01/11/2019.   Specialty: Behavioral Health Why: Therapy appointment with Oneita Kras is Tuesday, 11/10 at 1:00p.  Appt will be virtual or on the phone. The office will contact you.  Contact information: [place] [phone]          Next level of care provider has access to [place] and Suicide Prevention discussed: Yes,  with the patient's mother     Has patient been referred to the Quitline?: N/A patient is not a smoker  Patient has been referred for addiction treatment: N/A  Marylee Floras, [place] 01/04/2019, 9:17 AM

## 2019-01-04 NOTE — Progress Notes (Signed)
Patient seen. Available records reviewed. Blood sugar control is improving. Consider snack prior to bedtime. Consider consulting dietary team. Continue to monitor blood sugar closely.

## 2019-01-05 ENCOUNTER — Telehealth (HOSPITAL_COMMUNITY): Payer: Self-pay | Admitting: Professional

## 2019-01-07 ENCOUNTER — Other Ambulatory Visit: Payer: Self-pay

## 2019-01-07 ENCOUNTER — Encounter (HOSPITAL_COMMUNITY): Payer: Self-pay | Admitting: Emergency Medicine

## 2019-01-07 ENCOUNTER — Emergency Department (HOSPITAL_COMMUNITY): Payer: 59

## 2019-01-07 ENCOUNTER — Emergency Department (HOSPITAL_COMMUNITY)
Admission: EM | Admit: 2019-01-07 | Discharge: 2019-01-07 | Disposition: A | Discharge status: Home / Self Care | Payer: 59 | Source: Home / Self Care | Attending: Emergency Medicine | Admitting: Emergency Medicine

## 2019-01-07 DIAGNOSIS — K529 Noninfective gastroenteritis and colitis, unspecified: Secondary | ICD-10-CM | POA: Diagnosis not present

## 2019-01-07 DIAGNOSIS — E669 Obesity, unspecified: Secondary | ICD-10-CM | POA: Diagnosis not present

## 2019-01-07 DIAGNOSIS — Z68.41 Body mass index (BMI) pediatric, greater than or equal to 95th percentile for age: Secondary | ICD-10-CM | POA: Diagnosis not present

## 2019-01-07 DIAGNOSIS — F329 Major depressive disorder, single episode, unspecified: Secondary | ICD-10-CM | POA: Diagnosis not present

## 2019-01-07 DIAGNOSIS — Z79899 Other long term (current) drug therapy: Secondary | ICD-10-CM | POA: Insufficient documentation

## 2019-01-07 DIAGNOSIS — F17200 Nicotine dependence, unspecified, uncomplicated: Secondary | ICD-10-CM | POA: Diagnosis not present

## 2019-01-07 DIAGNOSIS — Z9641 Presence of insulin pump (external) (internal): Secondary | ICD-10-CM | POA: Diagnosis not present

## 2019-01-07 DIAGNOSIS — F4310 Post-traumatic stress disorder, unspecified: Secondary | ICD-10-CM | POA: Diagnosis not present

## 2019-01-07 DIAGNOSIS — R1031 Right lower quadrant pain: Secondary | ICD-10-CM | POA: Diagnosis not present

## 2019-01-07 DIAGNOSIS — R1084 Generalized abdominal pain: Secondary | ICD-10-CM

## 2019-01-07 DIAGNOSIS — E109 Type 1 diabetes mellitus without complications: Secondary | ICD-10-CM | POA: Diagnosis not present

## 2019-01-07 DIAGNOSIS — R1011 Right upper quadrant pain: Secondary | ICD-10-CM | POA: Diagnosis not present

## 2019-01-07 DIAGNOSIS — Z794 Long term (current) use of insulin: Secondary | ICD-10-CM | POA: Insufficient documentation

## 2019-01-07 LAB — COMPREHENSIVE METABOLIC PANEL
ALT: 22 U/L (ref 0–44)
AST: 12 U/L — ABNORMAL LOW (ref 15–41)
Albumin: 3.3 g/dL — ABNORMAL LOW (ref 3.5–5.0)
Alkaline Phosphatase: 85 U/L (ref 38–126)
Anion gap: 10 (ref 5–15)
BUN: 13 mg/dL (ref 6–20)
CO2: 22 mmol/L (ref 22–32)
Calcium: 9.1 mg/dL (ref 8.9–10.3)
Chloride: 107 mmol/L (ref 98–111)
Creatinine, Ser: 0.65 mg/dL (ref 0.44–1.00)
GFR calc Af Amer: >60 mL/min (ref >60)
GFR calc non Af Amer: >60 mL/min (ref >60)
Glucose, Bld: 80 mg/dL (ref 70–99)
Potassium: 3.6 mmol/L (ref 3.5–5.1)
Sodium: 139 mmol/L (ref 135–145)
Total Bilirubin: 0.7 mg/dL (ref 0.3–1.2)
Total Protein: 7.9 g/dL (ref 6.5–8.1)

## 2019-01-07 LAB — CBC
HCT: 41.5 % (ref 36.0–46.0)
Hemoglobin: 13.1 g/dL (ref 12.0–15.0)
MCH: 27.8 pg (ref 26.0–34.0)
MCHC: 31.6 g/dL (ref 30.0–36.0)
MCV: 88.1 fL (ref 80.0–100.0)
Platelets: 334 10*3/uL (ref 150–400)
RBC: 4.71 MIL/uL (ref 3.87–5.11)
RDW: 13.3 % (ref 11.5–15.5)
WBC: 11.6 10*3/uL — ABNORMAL HIGH (ref 4.0–10.5)
nRBC: 0 % (ref 0.0–0.2)

## 2019-01-07 LAB — I-STAT BETA HCG BLOOD, ED (MC, WL, AP ONLY): I-stat hCG, quantitative: <5 m[IU]/mL (ref <5)

## 2019-01-07 LAB — LIPASE, BLOOD: Lipase: 19 U/L (ref 11–51)

## 2019-01-07 LAB — CBG MONITORING, ED
Glucose-Capillary: 173 mg/dL — ABNORMAL HIGH (ref 70–99)
Glucose-Capillary: 50 mg/dL — ABNORMAL LOW (ref 70–99)

## 2019-01-07 MED ORDER — SODIUM CHLORIDE (PF) 0.9 % IJ SOLN
INTRAMUSCULAR | Status: AC
  Administered 2019-01-07: 17:00:00
  Filled 2019-01-07: qty 50

## 2019-01-07 MED ORDER — ONDANSETRON HCL 4 MG/2ML IJ SOLN
4.0000 mg | Freq: Once | INTRAMUSCULAR | Status: AC
  Administered 2019-01-07: 15:00:00 4 mg via INTRAVENOUS
  Filled 2019-01-07: qty 2

## 2019-01-07 MED ORDER — ONDANSETRON 4 MG PO TBDP: 4.0000 mg | ORAL_TABLET | Freq: Three times a day (TID) | ORAL | 0 refills | Status: DC | PRN

## 2019-01-07 MED ORDER — SODIUM CHLORIDE 0.9 % IV BOLUS
1000.0000 mL | Freq: Once | INTRAVENOUS | Status: AC
  Administered 2019-01-07: 15:00:00 1000 mL via INTRAVENOUS

## 2019-01-07 MED ORDER — DEXTROSE 50 % IV SOLN
1.0000 | Freq: Once | INTRAVENOUS | Status: AC
  Administered 2019-01-07: 50 mL via INTRAVENOUS
  Filled 2019-01-07: qty 50

## 2019-01-07 MED ORDER — ONDANSETRON HCL 4 MG/2ML IJ SOLN
4.0000 mg | Freq: Once | INTRAMUSCULAR | Status: DC
  Filled 2019-01-07: qty 2

## 2019-01-07 MED ORDER — SODIUM CHLORIDE 0.9 % IV BOLUS
500.0000 mL | Freq: Once | INTRAVENOUS | Status: AC
  Administered 2019-01-07: 18:00:00 500 mL via INTRAVENOUS

## 2019-01-07 MED ORDER — OXYCODONE-ACETAMINOPHEN 5-325 MG PO TABS: 1.0000 | ORAL_TABLET | Freq: Four times a day (QID) | ORAL | 0 refills | Status: DC | PRN

## 2019-01-07 MED ORDER — AMOXICILLIN-POT CLAVULANATE 875-125 MG PO TABS: 1.0000 | ORAL_TABLET | Freq: Two times a day (BID) | ORAL | 0 refills | Status: DC

## 2019-01-07 MED ORDER — IOHEXOL 300 MG/ML  SOLN
100.0000 mL | Freq: Once | INTRAMUSCULAR | Status: AC | PRN
  Administered 2019-01-07: 17:00:00 100 mL via INTRAVENOUS

## 2019-01-07 MED ORDER — HYDROMORPHONE HCL 1 MG/ML IJ SOLN
0.5000 mg | Freq: Once | INTRAMUSCULAR | Status: AC
  Administered 2019-01-07: 15:00:00 0.5 mg via INTRAVENOUS
  Filled 2019-01-07: qty 1

## 2019-01-07 MED ORDER — AMOXICILLIN-POT CLAVULANATE 875-125 MG PO TABS
1.0000 | ORAL_TABLET | Freq: Once | ORAL | Status: AC
  Administered 2019-01-07: 19:00:00 1 via ORAL
  Filled 2019-01-07: qty 1

## 2019-01-07 MED ORDER — OXYCODONE-ACETAMINOPHEN 5-325 MG PO TABS
1.0000 | ORAL_TABLET | Freq: Once | ORAL | Status: AC
  Administered 2019-01-07: 18:00:00 1 via ORAL
  Filled 2019-01-07: qty 1

## 2019-01-07 NOTE — ED Notes (Signed)
Patient aware of urine need 

## 2019-01-07 NOTE — ED Notes (Signed)
Pt tolerating PO intake at this time, will continue to monitor.

## 2019-01-07 NOTE — Discharge Instructions (Signed)
Please read and follow all provided instructions.  Your diagnoses today include:  1. Colitis   2. Generalized abdominal pain     Tests performed today include:  Blood counts and electrolytes - slightly high white blood cell count  Blood tests to check liver and kidney function  Blood tests to check pancreas function  Urine test to look for infection and pregnancy (in women)  CT scan - shows inflammation of the mesentery and some possible inflammation in the colon  Vital signs. See below for your results today.   Medications prescribed:   Augmentin - antibiotic  You have been prescribed an antibiotic medicine: take the entire course of medicine even if you are feeling better. Stopping early can cause the antibiotic not to work.   Percocet (oxycodone/acetaminophen) - narcotic pain medication  DO NOT drive or perform any activities that require you to be awake and alert because this medicine can make you drowsy. BE VERY CAREFUL not to take multiple medicines containing Tylenol (also called acetaminophen). Doing so can lead to an overdose which can damage your liver and cause liver failure and possibly death.   Zofran (ondansetron) - for nausea and vomiting  Take any prescribed medications only as directed.  Home care instructions:   Follow any educational materials contained in this packet.  Follow-up instructions: Please follow-up with your primary care provider in the next 3 days for further evaluation of your symptoms.    Return instructions:  SEEK IMMEDIATE MEDICAL ATTENTION IF:  The pain does not go away or becomes severe   A temperature above 101F develops   Repeated vomiting occurs (multiple episodes)   The pain becomes localized to portions of the abdomen. The right side could possibly be appendicitis. In an adult, the left lower portion of the abdomen could be colitis or diverticulitis.   Blood is being passed in stools or vomit (bright red or black tarry  stools)   You develop chest pain, difficulty breathing, dizziness or fainting, or become confused, poorly responsive, or inconsolable (young children)  If you have any other emergent concerns regarding your health  Additional Information: Abdominal (belly) pain can be caused by many things. Your caregiver performed an examination and possibly ordered blood/urine tests and imaging (CT scan, x-rays, ultrasound). Many cases can be observed and treated at home after initial evaluation in the emergency department. Even though you are being discharged home, abdominal pain can be unpredictable. Therefore, you need a repeated exam if your pain does not resolve, returns, or worsens. Most patients with abdominal pain don't have to be admitted to the hospital or have surgery, but serious problems like appendicitis and gallbladder attacks can start out as nonspecific pain. Many abdominal conditions cannot be diagnosed in one visit, so follow-up evaluations are very important.  Your vital signs today were: BP (!) 110/59    Pulse 80    Temp 98 F (36.7 C) (Oral)    Resp 15    LMP 12/31/2018 (Approximate)    SpO2 97%  If your blood pressure (bp) was elevated above 135/85 this visit, please have this repeated by your doctor within one month. --------------

## 2019-01-07 NOTE — ED Triage Notes (Signed)
Patient c/o RLQ pain x4 days with vomiting and diarrhea today.

## 2019-01-07 NOTE — ED Provider Notes (Signed)
[facility]-EMERGENCY DEPT Provider Note   CSN: [id number] Arrival date & time: 01/07/19  1325     History   Chief Complaint Chief Complaint  Patient presents with  . Abdominal Pain    HPI Lydia Estrada is a 18 y.o. female.     Patient with history of insulin-dependent diabetes, insulin pump, depression --presents to the emergency department today with complaint of right-sided abdominal pain.  Symptoms started 4 days ago.  Symptoms were mild at first but have worsened.  Patient has had worsening pain starting this morning.  Pain is worse with movement and palpation in the right upper, mid abdomen, and right lower quadrant.  She denies any fevers, chest pain, shortness of breath or cough.  She denies any urinary symptoms or vaginal bleeding/discharge.  She has had some watery diarrhea which has slowed, one episode this morning that was nonbloody.  Mother states that they were going to try to go to the doctor today but she had severe pain while riding in the car and they decided to come to the emergency department.  No previous abdominal surgeries.  One previous episode of DKA which occurred when the patient presented with new onset diabetes.  She seems to monitor her diabetes very carefully.  Blood sugars maximum 240.     Past Medical History:  Diagnosis Date  . Type 1 diabetes mellitus (HCC)    Dx 03/2017, A1c 11%, presented in DKA. GAD antibodies markedly positive at 1493 (<5)    Patient Active Problem List   Diagnosis Date Noted  . Severe recurrent major depression without psychotic features (HCC) 12/28/2018  . Post traumatic stress disorder (PTSD) 12/28/2018  . Hyperglycemia 09/02/2018  . Insulin pump titration 09/02/2018  . Weight gain 09/02/2018  . Type 1 diabetes mellitus without complication (HCC) 07/16/2017  . 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 Obesity without serious comorbidity with body mass index (BMI) greater than 99th percentile for age  in pediatric patient 07/16/2017  . Acanthosis nigricans, acquired 04/13/2017  . Dyspepsia 04/13/2017  . New onset of diabetes mellitus in pediatric patient (HCC)   . Dehydration   . Ketonuria   . Adjustment reaction to medical therapy     History reviewed. No pertinent surgical history.   OB History    Gravida  0   Para      Term      Preterm      AB      Living        SAB      TAB      Ectopic      Multiple      Live Births               Home Medications    Prior to Admission medications   Medication Sig Start Date End Date Taking? Authorizing Provider  ELINEST 0.3-30 MG-MCG tablet Take 0.3 mg by mouth at bedtime. 10/18/18   [provider]  glucagon 1 MG injection Follow package directions for low blood sugar. 06/01/18 06/01/19  David StallBrennan, Michael J, MD  glucose blood (FREESTYLE LITE) test strip USE TO TEST BLOOD SUGAR 6 TIMES PER DAY. 09/02/18   Gretchen ShortBeasley, Spenser, NP  hydrOXYzine (ATARAX/VISTARIL) 25 MG tablet Take 1 tablet (25 mg total) by mouth 3 (three) times daily as needed for anxiety. 01/04/19   Aldean BakerSykes, Janet E, NP  Insulin Human (INSULIN PUMP) SOLN Inject 1 each into the skin 4 (four) times daily -  before meals and  at bedtime. 01/04/19   Connye Burkitt, NP  nicotine (NICODERM CQ - DOSED IN MG/24 HOURS) 21 mg/24hr patch Place 1 patch (21 mg total) onto the skin daily. 01/05/19   Connye Burkitt, NP  sertraline (ZOLOFT) 50 MG tablet Take 1 tablet (50 mg total) by mouth daily. 01/05/19   Connye Burkitt, NP  traZODone (DESYREL) 50 MG tablet Take 1 tablet (50 mg total) by mouth at bedtime as needed for sleep. 01/04/19   Connye Burkitt, NP    Family History Family History  Problem Relation Age of Onset  . Diabetes Maternal Grandmother   . Diabetes Maternal Grandfather     Social History Social History   Tobacco Use  . Smoking status: Never Smoker  . Smokeless tobacco: Never Used  Substance Use Topics  . Alcohol use: No    Frequency: Never  . Drug  use: No     Allergies   Ibuprofen and Other   Review of Systems Review of Systems  Constitutional: Negative for fever.  HENT: Negative for rhinorrhea and sore throat.   Eyes: Negative for redness.  Respiratory: Negative for cough.   Cardiovascular: Negative for chest pain.  Gastrointestinal: Positive for abdominal pain, diarrhea, nausea and vomiting. Negative for blood in stool and constipation.  Genitourinary: Negative for dysuria, vaginal bleeding and vaginal discharge.  Musculoskeletal: Negative for myalgias.  Skin: Negative for rash.  Neurological: Negative for headaches.     Physical Exam Updated Vital Signs BP 138/60 (BP Location: Right Arm)   Pulse 91   Temp 98 F (36.7 C) (Oral)   Resp 16   LMP 12/31/2018 (Approximate)   SpO2 96%   Physical Exam Vitals signs and nursing note reviewed.  Constitutional:      Appearance: She is well-developed.  HENT:     Head: Normocephalic and atraumatic.  Eyes:     General:        Right eye: No discharge.        Left eye: No discharge.     Conjunctiva/sclera: Conjunctivae normal.  Neck:     Musculoskeletal: Normal range of motion and neck supple.  Cardiovascular:     Rate and Rhythm: Normal rate and regular rhythm.     Heart sounds: Normal heart sounds.  Pulmonary:     Effort: Pulmonary effort is normal.     Breath sounds: Normal breath sounds.  Abdominal:     Palpations: Abdomen is soft.     Tenderness: There is abdominal tenderness in the right upper quadrant and right lower quadrant. There is guarding. There is no rebound. Positive signs include McBurney's sign. Negative signs include Murphy's sign and Rovsing's sign.     Comments: Moderate to severe tenderness to palpation in the right upper and right lower quadrants.  Patient winces in pain with palpation in these areas.  Skin:    General: Skin is warm and dry.  Neurological:     Mental Status: She is alert.      ED Treatments / Results  Labs (all labs  ordered are listed, but only abnormal results are displayed) Labs Reviewed  COMPREHENSIVE METABOLIC PANEL - Abnormal; Notable for the following components:      Result Value   Albumin 3.3 (*)    AST 12 (*)    All other components within normal limits  CBC - Abnormal; Notable for the following components:   WBC 11.6 (*)    All other components within normal limits  CBG MONITORING, ED -  Abnormal; Notable for the following components:   Glucose-Capillary 50 (*)    All other components within normal limits  CBG MONITORING, ED - Abnormal; Notable for the following components:   Glucose-Capillary 173 (*)    All other components within normal limits  LIPASE, BLOOD  URINALYSIS, ROUTINE W REFLEX MICROSCOPIC  I-STAT BETA HCG BLOOD, ED ([facility], [facility], [facility] ONLY)    EKG None  Radiology Ct Abdomen Pelvis W Contrast  Result Date: 01/07/2019 CLINICAL DATA:  Right lower quadrant pain EXAM: CT ABDOMEN AND PELVIS WITH CONTRAST TECHNIQUE: Multidetector CT imaging of the abdomen and pelvis was performed using the standard protocol following bolus administration of intravenous contrast. CONTRAST:  OMNIPAQUE IOHEXOL 300 MG/ML  SOLN COMPARISON:  None. FINDINGS: Lower chest: No acute abnormality. Hepatobiliary: No focal liver abnormality is seen. No gallstones, gallbladder wall thickening, or biliary dilatation. Pancreas: Unremarkable. No pancreatic ductal dilatation or surrounding inflammatory changes. Spleen: Normal in size without focal abnormality. Adrenals/Urinary Tract: Adrenal glands are unremarkable. Kidneys are normal, without renal calculi, focal lesion, or hydronephrosis. Bladder is unremarkable. Stomach/Bowel: The stomach is nonenlarged. No dilated small bowel. Mild cecal wall thickening. Negative appendix. Vascular/Lymphatic: Nonaneurysmal aorta. Multiple small mesenteric nodes Reproductive: Uterus and bilateral adnexa are unremarkable. Other: Small amount of loculated fluid at the left adnexa.  Generalized haziness and soft tissue infiltration of the mesentery. No free air Musculoskeletal: No acute or significant osseous findings. IMPRESSION: 1. Negative for acute appendicitis. 2. Generalized haziness and soft tissue stranding throughout the mesentery suggestive of diffuse edema or generalized mesenteric inflammatory process. Multiple small mesenteric nodes. Mild cecal wall thickening, potentially due to colitis. Electronically Signed   By: Jasmine Pang M.D.   On: 01/07/2019 17:08    Procedures Procedures (including critical care time)  Medications Ordered in ED Medications  ondansetron (ZOFRAN) injection 4 mg (0 mg Intravenous Hold 01/07/19 1803)  amoxicillin-clavulanate (AUGMENTIN) 875-125 MG per tablet 1 tablet (has no administration in time range)  ondansetron (ZOFRAN) injection 4 mg (4 mg Intravenous Given 01/07/19 1437)  HYDROmorphone (DILAUDID) injection 0.5 mg (0.5 mg Intravenous Given 01/07/19 1437)  sodium chloride 0.9 % bolus 1,000 mL (0 mLs Intravenous Stopped 01/07/19 1626)  HYDROmorphone (DILAUDID) injection 0.5 mg (0.5 mg Intravenous Given 01/07/19 1507)  dextrose 50 % solution 50 mL (50 mLs Intravenous Given 01/07/19 1600)  iohexol (OMNIPAQUE) 300 MG/ML solution 100 mL (100 mLs Intravenous Contrast Given 01/07/19 1632)  sodium chloride (PF) 0.9 % injection (  Given by Other 01/07/19 1654)  oxyCODONE-acetaminophen (PERCOCET/ROXICET) 5-325 MG per tablet 1 tablet (1 tablet Oral Given 01/07/19 1733)  sodium chloride 0.9 % bolus 500 mL (0 mLs Intravenous Stopped 01/07/19 1847)     Initial Impression / Assessment and Plan / ED Course  I have reviewed the triage vital signs and the nursing notes.  Pertinent labs & imaging results that were available during my care of the patient were reviewed by me and considered in my medical decision making (see chart for details).        Patient seen and examined. Work-up initiated. Medications ordered.  Patient will need imaging to  evaluate for possibility of cholecystitis, appendicitis, colitis, pyelonephritis or kidney stone.  Vital signs reviewed and are as follows: BP 138/60 (BP Location: Right Arm)   Pulse 91   Temp 98 F (36.7 C) (Oral)   Resp 16   LMP 12/31/2018 (Approximate)   SpO2 96%   5:52 PM patient had a hypoglycemic episode earlier so she was treated with juice  and D50.  Per family, patient vomited after drinking the juice.  I was not aware of this.  CT with findings of mesenteric edema and possible colitis.  Discussed CT imaging with Dr. Denton Lank.  Currently continuing to work on the patient's symptoms.  We will try to transition over to oral pain and nausea medications.  We will continue to hydrate.  She will need a p.o. challenge after additional dose of antiemetic.  If symptoms controlled, plan discharge to home on Augmentin, GI/PCP follow-up.  7:13 PM patient is feeling much better.  She is tolerating crackers and fluids.  Pain is controlled.  Plan is to discharge to home with PCP/GI follow-up.  Home with Percocet, Zofran, Augmentin..   The patient was urged to return to the Emergency Department immediately with worsening of current symptoms, worsening abdominal pain, persistent vomiting, blood noted in stools, fever, or any other concerns. The patient verbalized understanding.   Patient counseled on use of narcotic pain medications. Counseled not to combine these medications with others containing tylenol. Urged not to drink alcohol, drive, or perform any other activities that requires focus while taking these medications. The patient verbalizes understanding and agrees with the plan.   Final Clinical Impressions(s) / ED Diagnoses   Final diagnoses:  Colitis  Generalized abdominal pain   Abdominal pain: Patient with abdominal pain. Vitals are stable, no fever. Labs mild WBC elevation. Imaging shows nondistinct mesenteric edema with possible localized colitis. No signs of dehydration, patient  is now tolerating PO's. Lungs are clear and no signs suggestive of PNA. Low concern for appendicitis (not seen on CT), cholecystitis, pancreatitis, ruptured viscus, UTI, kidney stone, aortic dissection, aortic aneurysm or other emergent abdominal etiology. Supportive therapy indicated with return if symptoms worsen.    ED Discharge Orders         Ordered    amoxicillin-clavulanate (AUGMENTIN) 875-125 MG tablet  Every 12 hours     01/07/19 1912    ondansetron (ZOFRAN ODT) 4 MG disintegrating tablet  Every 8 hours PRN     01/07/19 1912    oxyCODONE-acetaminophen (PERCOCET/ROXICET) 5-325 MG tablet  Every 6 hours PRN     01/07/19 1912           Renne Crigler, Cordelia Poche 01/07/19 Biagio Quint, MD 01/08/19 2043

## 2019-01-07 NOTE — ED Notes (Signed)
Patient transported to CT 

## 2019-01-07 NOTE — ED Notes (Addendum)
Patients blood sugar resulted to be 50mg . I informed the Dr and he agreed to give apple or orange juice. I asked the patient which one she preferred and she said orange juice.

## 2019-01-07 NOTE — ED Notes (Signed)
Family at bedside. 

## 2019-01-07 NOTE — ED Notes (Signed)
Pt denies nausea at this time.  Provided with saltines and diet ginger ale, per her request.

## 2019-01-09 ENCOUNTER — Inpatient Hospital Stay (HOSPITAL_COMMUNITY)
Admission: EM | Admit: 2019-01-09 | Discharge: 2019-01-11 | DRG: 392 | Disposition: A | Discharge status: Home / Self Care | Payer: 59 | Attending: Infectious Disease | Admitting: Infectious Disease

## 2019-01-09 ENCOUNTER — Encounter (HOSPITAL_COMMUNITY): Payer: Self-pay

## 2019-01-09 ENCOUNTER — Other Ambulatory Visit: Payer: Self-pay

## 2019-01-09 ENCOUNTER — Emergency Department (HOSPITAL_COMMUNITY): Payer: 59

## 2019-01-09 DIAGNOSIS — E109 Type 1 diabetes mellitus without complications: Secondary | ICD-10-CM | POA: Diagnosis present

## 2019-01-09 DIAGNOSIS — R1011 Right upper quadrant pain: Secondary | ICD-10-CM | POA: Diagnosis not present

## 2019-01-09 DIAGNOSIS — E669 Obesity, unspecified: Secondary | ICD-10-CM

## 2019-01-09 DIAGNOSIS — K529 Noninfective gastroenteritis and colitis, unspecified: Secondary | ICD-10-CM | POA: Diagnosis not present

## 2019-01-09 DIAGNOSIS — Z9641 Presence of insulin pump (external) (internal): Secondary | ICD-10-CM | POA: Diagnosis present

## 2019-01-09 DIAGNOSIS — Z79899 Other long term (current) drug therapy: Secondary | ICD-10-CM

## 2019-01-09 DIAGNOSIS — F4310 Post-traumatic stress disorder, unspecified: Secondary | ICD-10-CM | POA: Diagnosis present

## 2019-01-09 DIAGNOSIS — F17200 Nicotine dependence, unspecified, uncomplicated: Secondary | ICD-10-CM | POA: Diagnosis present

## 2019-01-09 DIAGNOSIS — F329 Major depressive disorder, single episode, unspecified: Secondary | ICD-10-CM | POA: Diagnosis present

## 2019-01-09 DIAGNOSIS — Z794 Long term (current) use of insulin: Secondary | ICD-10-CM

## 2019-01-09 DIAGNOSIS — Z833 Family history of diabetes mellitus: Secondary | ICD-10-CM

## 2019-01-09 DIAGNOSIS — Z888 Allergy status to other drugs, medicaments and biological substances status: Secondary | ICD-10-CM

## 2019-01-09 DIAGNOSIS — R109 Unspecified abdominal pain: Secondary | ICD-10-CM

## 2019-01-09 DIAGNOSIS — F418 Other specified anxiety disorders: Secondary | ICD-10-CM | POA: Diagnosis present

## 2019-01-09 DIAGNOSIS — Z68.41 Body mass index (BMI) pediatric, greater than or equal to 95th percentile for age: Secondary | ICD-10-CM

## 2019-01-09 LAB — COMPREHENSIVE METABOLIC PANEL
ALT: 33 U/L (ref 0–44)
AST: 18 U/L (ref 15–41)
Albumin: 3.1 g/dL — ABNORMAL LOW (ref 3.5–5.0)
Alkaline Phosphatase: 100 U/L (ref 38–126)
Anion gap: 9 (ref 5–15)
BUN: 7 mg/dL (ref 6–20)
CO2: 24 mmol/L (ref 22–32)
Calcium: 8.9 mg/dL (ref 8.9–10.3)
Chloride: 106 mmol/L (ref 98–111)
Creatinine, Ser: 0.72 mg/dL (ref 0.44–1.00)
GFR calc Af Amer: >60 mL/min (ref >60)
GFR calc non Af Amer: >60 mL/min (ref >60)
Glucose, Bld: 121 mg/dL — ABNORMAL HIGH (ref 70–99)
Potassium: 3.6 mmol/L (ref 3.5–5.1)
Sodium: 139 mmol/L (ref 135–145)
Total Bilirubin: 0.5 mg/dL (ref 0.3–1.2)
Total Protein: 7.7 g/dL (ref 6.5–8.1)

## 2019-01-09 LAB — CBC WITH DIFFERENTIAL/PLATELET
Abs Immature Granulocytes: 0.07 10*3/uL (ref 0.00–0.07)
Basophils Absolute: 0 10*3/uL (ref 0.0–0.1)
Basophils Relative: 0 %
Eosinophils Absolute: 0.1 10*3/uL (ref 0.0–0.5)
Eosinophils Relative: 1 %
HCT: 40.4 % (ref 36.0–46.0)
Hemoglobin: 12.8 g/dL (ref 12.0–15.0)
Immature Granulocytes: 1 %
Lymphocytes Relative: 9 %
Lymphs Abs: 1.3 10*3/uL (ref 0.7–4.0)
MCH: 28.1 pg (ref 26.0–34.0)
MCHC: 31.7 g/dL (ref 30.0–36.0)
MCV: 88.6 fL (ref 80.0–100.0)
Monocytes Absolute: 0.7 10*3/uL (ref 0.1–1.0)
Monocytes Relative: 5 %
Neutro Abs: 12.1 10*3/uL — ABNORMAL HIGH (ref 1.7–7.7)
Neutrophils Relative %: 84 %
Platelets: 372 10*3/uL (ref 150–400)
RBC: 4.56 MIL/uL (ref 3.87–5.11)
RDW: 13.5 % (ref 11.5–15.5)
WBC: 14.4 10*3/uL — ABNORMAL HIGH (ref 4.0–10.5)
nRBC: 0 % (ref 0.0–0.2)

## 2019-01-09 LAB — WET PREP, GENITAL
Clue Cells Wet Prep HPF POC: NONE SEEN
Sperm: NONE SEEN
Trich, Wet Prep: NONE SEEN
Yeast Wet Prep HPF POC: NONE SEEN

## 2019-01-09 LAB — PREGNANCY, URINE: Preg Test, Ur: NEGATIVE

## 2019-01-09 LAB — URINALYSIS, ROUTINE W REFLEX MICROSCOPIC
Bilirubin Urine: NEGATIVE
Glucose, UA: NEGATIVE mg/dL
Ketones, ur: NEGATIVE mg/dL
Nitrite: NEGATIVE
Protein, ur: 30 mg/dL — AB
Specific Gravity, Urine: 1.016 (ref 1.005–1.030)
pH: 6 (ref 5.0–8.0)

## 2019-01-09 LAB — GLUCOSE, CAPILLARY: Glucose-Capillary: 173 mg/dL — ABNORMAL HIGH (ref 70–99)

## 2019-01-09 LAB — LIPASE, BLOOD: Lipase: 18 U/L (ref 11–51)

## 2019-01-09 LAB — CBG MONITORING, ED: Glucose-Capillary: 108 mg/dL — ABNORMAL HIGH (ref 70–99)

## 2019-01-09 MED ORDER — METRONIDAZOLE IN NACL 5-0.79 MG/ML-% IV SOLN
500.0000 mg | Freq: Once | INTRAVENOUS | Status: AC
  Administered 2019-01-09: 500 mg via INTRAVENOUS
  Filled 2019-01-09: qty 100

## 2019-01-09 MED ORDER — ENOXAPARIN SODIUM 60 MG/0.6ML ~~LOC~~ SOLN
60.0000 mg | SUBCUTANEOUS | Status: DC
  Administered 2019-01-09 – 2019-01-10 (×2): 60 mg via SUBCUTANEOUS
  Filled 2019-01-09 (×2): qty 0.6

## 2019-01-09 MED ORDER — FENTANYL CITRATE (PF) 100 MCG/2ML IJ SOLN
50.0000 ug | Freq: Once | INTRAMUSCULAR | Status: AC
  Administered 2019-01-09: 50 ug via INTRAVENOUS
  Filled 2019-01-09: qty 2

## 2019-01-09 MED ORDER — METRONIDAZOLE IN NACL 5-0.79 MG/ML-% IV SOLN
500.0000 mg | Freq: Three times a day (TID) | INTRAVENOUS | Status: DC
  Administered 2019-01-09 – 2019-01-11 (×6): 500 mg via INTRAVENOUS
  Filled 2019-01-09 (×6): qty 100

## 2019-01-09 MED ORDER — SODIUM CHLORIDE (PF) 0.9 % IJ SOLN
INTRAMUSCULAR | Status: AC
  Filled 2019-01-09: qty 50

## 2019-01-09 MED ORDER — IOHEXOL 300 MG/ML  SOLN
100.0000 mL | Freq: Once | INTRAMUSCULAR | Status: AC | PRN
  Administered 2019-01-09: 100 mL via INTRAVENOUS

## 2019-01-09 MED ORDER — METOCLOPRAMIDE HCL 5 MG/ML IJ SOLN
10.0000 mg | Freq: Once | INTRAMUSCULAR | Status: AC
  Administered 2019-01-09: 10 mg via INTRAVENOUS
  Filled 2019-01-09: qty 2

## 2019-01-09 MED ORDER — DEXTROSE-NACL 5-0.9 % IV SOLN
INTRAVENOUS | Status: DC
  Administered 2019-01-10: 17:00:00 via INTRAVENOUS

## 2019-01-09 MED ORDER — SODIUM CHLORIDE 0.9 % IV BOLUS
1000.0000 mL | Freq: Once | INTRAVENOUS | Status: AC
  Administered 2019-01-09: 1000 mL via INTRAVENOUS

## 2019-01-09 MED ORDER — INSULIN PUMP
Freq: Three times a day (TID) | SUBCUTANEOUS | Status: DC
  Administered 2019-01-10 – 2019-01-11 (×2): via SUBCUTANEOUS
  Filled 2019-01-09: qty 1

## 2019-01-09 MED ORDER — DEXTROSE 5 % IV SOLN: Freq: Once | INTRAVENOUS | Status: DC

## 2019-01-09 MED ORDER — SODIUM CHLORIDE 0.9 % IV SOLN
2.0000 g | INTRAVENOUS | Status: DC
  Administered 2019-01-10 – 2019-01-11 (×2): 2 g via INTRAVENOUS
  Filled 2019-01-09 (×2): qty 2

## 2019-01-09 MED ORDER — OXYCODONE HCL 5 MG PO TABS
5.0000 mg | ORAL_TABLET | Freq: Four times a day (QID) | ORAL | Status: DC | PRN
  Administered 2019-01-09 – 2019-01-11 (×5): 5 mg via ORAL
  Filled 2019-01-09 (×5): qty 1

## 2019-01-09 MED ORDER — POLYETHYLENE GLYCOL 3350 17 G PO PACK: 17.0000 g | PACK | Freq: Every day | ORAL | Status: DC | PRN

## 2019-01-09 MED ORDER — DEXTROSE 10 % IV BOLUS: 5.0000 mL/kg | Freq: Once | INTRAVENOUS | Status: DC

## 2019-01-09 MED ORDER — SERTRALINE HCL 50 MG PO TABS
50.0000 mg | ORAL_TABLET | Freq: Every day | ORAL | Status: DC
  Administered 2019-01-09 – 2019-01-11 (×3): 50 mg via ORAL
  Filled 2019-01-09 (×3): qty 1

## 2019-01-09 MED ORDER — MORPHINE SULFATE (PF) 4 MG/ML IV SOLN
4.0000 mg | Freq: Once | INTRAVENOUS | Status: AC
  Administered 2019-01-09: 4 mg via INTRAVENOUS
  Filled 2019-01-09: qty 1

## 2019-01-09 MED ORDER — ONDANSETRON HCL 4 MG/2ML IJ SOLN: 4.0000 mg | Freq: Four times a day (QID) | INTRAMUSCULAR | Status: DC | PRN

## 2019-01-09 MED ORDER — SODIUM CHLORIDE 0.9 % IV SOLN
2.0000 g | Freq: Once | INTRAVENOUS | Status: AC
  Administered 2019-01-09: 2 g via INTRAVENOUS
  Filled 2019-01-09: qty 20

## 2019-01-09 MED ORDER — ACETAMINOPHEN 650 MG RE SUPP: 650.0000 mg | Freq: Four times a day (QID) | RECTAL | Status: DC | PRN

## 2019-01-09 MED ORDER — HYDROXYZINE HCL 25 MG PO TABS
25.0000 mg | ORAL_TABLET | Freq: Three times a day (TID) | ORAL | Status: DC | PRN
  Administered 2019-01-09 (×2): 25 mg via ORAL
  Filled 2019-01-09 (×2): qty 1

## 2019-01-09 MED ORDER — INSULIN GLARGINE 100 UNIT/ML ~~LOC~~ SOLN
25.0000 [IU] | Freq: Every day | SUBCUTANEOUS | Status: DC
  Administered 2019-01-09 – 2019-01-11 (×3): 25 [IU] via SUBCUTANEOUS
  Filled 2019-01-09 (×4): qty 0.25

## 2019-01-09 MED ORDER — NICOTINE 21 MG/24HR TD PT24
21.0000 mg | MEDICATED_PATCH | Freq: Every day | TRANSDERMAL | Status: DC
  Administered 2019-01-09 – 2019-01-11 (×3): 21 mg via TRANSDERMAL
  Filled 2019-01-09 (×3): qty 1

## 2019-01-09 MED ORDER — TRAZODONE HCL 50 MG PO TABS
50.0000 mg | ORAL_TABLET | Freq: Every evening | ORAL | Status: DC | PRN
  Administered 2019-01-11: 50 mg via ORAL
  Filled 2019-01-09: qty 1

## 2019-01-09 MED ORDER — ACETAMINOPHEN 325 MG PO TABS
650.0000 mg | ORAL_TABLET | Freq: Four times a day (QID) | ORAL | Status: DC | PRN
  Administered 2019-01-09 – 2019-01-10 (×3): 650 mg via ORAL
  Filled 2019-01-09 (×3): qty 2

## 2019-01-09 MED ORDER — INSULIN GLARGINE 100 UNIT/ML ~~LOC~~ SOLN
30.0000 [IU] | Freq: Every day | SUBCUTANEOUS | Status: DC
  Filled 2019-01-09: qty 0.3

## 2019-01-09 MED ORDER — MORPHINE SULFATE (PF) 2 MG/ML IV SOLN
1.0000 mg | INTRAVENOUS | Status: DC | PRN
  Administered 2019-01-09 – 2019-01-11 (×5): 2 mg via INTRAVENOUS
  Filled 2019-01-09 (×5): qty 1

## 2019-01-09 MED ORDER — ONDANSETRON HCL 4 MG PO TABS
4.0000 mg | ORAL_TABLET | Freq: Four times a day (QID) | ORAL | Status: DC | PRN
  Administered 2019-01-09 – 2019-01-10 (×2): 4 mg via ORAL
  Filled 2019-01-09 (×2): qty 1

## 2019-01-09 MED ORDER — INSULIN PUMP: 1.0000 | SUBCUTANEOUS | Status: DC

## 2019-01-09 MED ORDER — DEXTROSE-NACL 5-0.9 % IV SOLN
INTRAVENOUS | Status: DC
  Administered 2019-01-09: 11:00:00 via INTRAVENOUS

## 2019-01-09 NOTE — ED Provider Notes (Addendum)
Medical screening examination/treatment/procedure(s) were conducted as a shared visit with non-physician practitioner(s) and myself.  I personally evaluated the patient during the encounter. Briefly, the patient is a 18 y.o. female history of diabetes who presents to the ED with ongoing right-sided abdominal pain.  Recently had a CT scan that showed mesenteric adenitis/colitis and has taken antibiotics for 1 day.  Had one episode of diarrhea several days ago but has been okay since.  Has had vomiting.  Has tenderness both in the right lower and right upper quadrant on abdominal exam.  Vital signs are overall reassuring.  No fever.  Concern for ongoing mesenteric adenitis/colitis versus may be appendicitis versus possibly gallbladder etiology.  Ultrasound did not show any gallstones or acute cholecystitis.  White count mildly increased to 14.4 but otherwise lab work is unremarkable.  Still continues to have pain and nausea and will get new CT scan to ensure no appendicitis or worsening colitis.  May need admission for hydration, IV antibiotics as having pain and difficulty tolerating p.o. but has been able to tolerate antibiotics.  We will also get pelvic exam but denies any vaginal discharge or bleeding.  Did not have any ovarian cysts on prior CT scan.  Does not appear consistent with torsion.  CT scan shows mildly progressive infectious versus inflammatory enteritis.  Appendix overall without signs to suggest acute appendicitis.  Although CT scan does state that it is mildly prominent.  She has mostly right-sided tenderness on exam and have low suspicion for appendicitis at this time given that she has inflammation within the small bowel.  Medicine team can continue to monitor her examining patient. Will admit for further IV hydration and antibiotics given worsening symptoms and failed outpatient therapy.  Possibly inflammatory versus infectious.  Will likely need GI follow-up.  This chart was dictated using  voice recognition software.  Despite best efforts to proofread,  errors can occur which can change the documentation meaning.    EKG Interpretation None           Lennice Sites, DO 01/09/19 1106    Lennice Sites, DO 01/09/19 1106

## 2019-01-09 NOTE — H&P (Addendum)
History and Physical  Lydia Estrada IPJ:[id number] DOB: Oct 14, 2000 DOA: 01/09/2019  PCP: Glenford Bayley, DO Patient coming from: Home  I have personally briefly reviewed patient's old medical records in [place]   Chief Complaint: Worsening abdominal pain  HPI: Lydia Estrada is a 18 y.o. female with past medical history significant for diabetes type 1 on insulin pump and Lantus at the same time, patient uses 30 units of Lantus daily and almost 44 units of U200 insulin pump patient presents planing of worsening abdominal pain since 4 AM.  Patient was evaluated on 11/6 for abdominal pain, she was diagnosed with mild colitis, she was discharged home on oral Augmentin and pain medication.  Patient took 1 day of the oral antibiotic and presented today to the emergency department complaining of worsening abdominal pain.  She vomits once.  She had an episode of diarrhea few days ago which had resolved.  Patient has not been able to tolerate oral antibiotics, due to vomiting.  Evaluation in the ED: Sodium 139, potassium 3.6, BUN 7, creatinine 0.7, albumin 3.1, liver function test normal, white blood cell 14 C 12 pregnancy test negative, UA 6-10 white blood cell. CT abdomen: Showed;Findings most compatible with infectious/inflammatory enteritis, possibly mildly progressive. No evidence of bowel obstruction. Mildly prominent appendix without findings favoring acute Appendicitis. Small volume pelvic ascites, mildly progressive. No drainable fluid collection/abscess. No free air.   Review of Systems: All systems reviewed and apart from history of presenting illness, are negative.  Past Medical History:  Diagnosis Date   Type 1 diabetes mellitus ([place])    Dx 03/2017, A1c 11%, presented in DKA. GAD antibodies markedly positive at 1493 (<5)   History reviewed. No pertinent surgical history. Social History:  reports that she smokes.  She has never used smokeless tobacco. She reports that she does  not drink alcohol or use drugs.   Allergies  Allergen Reactions   Ibuprofen     Throat swelling   Other     Seaweed     Family History  Problem Relation Age of Onset   Diabetes Maternal Grandmother    Diabetes Maternal Grandfather     Prior to Admission medications   Medication Sig Start Date End Date Taking? Authorizing Provider  acetaminophen (TYLENOL) 325 MG tablet Take 650 mg by mouth every 6 (six) hours as needed for mild pain or headache.   Yes [provider]  amoxicillin-clavulanate (AUGMENTIN) 875-125 MG tablet Take 1 tablet by mouth every 12 (twelve) hours. 01/07/19  Yes Carlisle Cater, PA-C  ELINEST 0.3-30 MG-MCG tablet Take 0.3 mg by mouth at bedtime. 10/18/18  Yes [provider]  glucagon 1 MG injection Follow package directions for low blood sugar. 06/01/18 06/01/19 Yes Sherrlyn Hock, MD  hydrOXYzine (ATARAX/VISTARIL) 25 MG tablet Take 1 tablet (25 mg total) by mouth 3 (three) times daily as needed for anxiety. 01/04/19  Yes Connye Burkitt, NP  insulin glargine (LANTUS) 100 UNIT/ML injection Inject 30 Units into the skin daily.   Yes [provider]  Insulin Human (INSULIN PUMP) SOLN Inject 1 each into the skin 4 (four) times daily -  before meals and at bedtime. Patient taking differently: Inject 1 each into the skin continuous. humalog u-200 01/04/19  Yes Connye Burkitt, NP  metFORMIN (GLUCOPHAGE-XR) 500 MG 24 hr tablet Take 1,500 mg by mouth daily with breakfast.   Yes [provider]  nicotine (NICODERM CQ - DOSED IN MG/24 HOURS) 21  mg/24hr patch Place 1 patch (21 mg total) onto the skin daily. 01/05/19  Yes Aldean Baker, NP  ondansetron (ZOFRAN ODT) 4 MG disintegrating tablet Take 1 tablet (4 mg total) by mouth every 8 (eight) hours as needed for nausea or vomiting. 01/07/19  Yes Renne Crigler, PA-C  oxyCODONE-acetaminophen (PERCOCET/ROXICET) 5-325 MG tablet Take 1-2 tablets by mouth every 6 (six) hours as needed for severe  pain. 01/07/19  Yes Renne Crigler, PA-C  sertraline (ZOLOFT) 50 MG tablet Take 1 tablet (50 mg total) by mouth daily. 01/05/19  Yes Aldean Baker, NP  traZODone (DESYREL) 50 MG tablet Take 1 tablet (50 mg total) by mouth at bedtime as needed for sleep. 01/04/19  Yes Aldean Baker, NP  glucose blood (FREESTYLE LITE) test strip USE TO TEST BLOOD SUGAR 6 TIMES PER DAY. 09/02/18   Gretchen Short, NP   Physical Exam: Vitals:   01/09/19 1115 01/09/19 1130 01/09/19 1300 01/09/19 1400  BP:  (!) 161/79 136/80 133/60  Pulse: 79 84 73 85  Resp:  16 16   Temp:      TempSrc:      SpO2: 98% 95% 95% 97%     General exam: Moderately built and nourished patient, lying comfortably supine on the gurney in no obvious distress.   Head, eyes and ENT: Nontraumatic and normocephalic. Pupils equally reacting to light and accommodation. Oral mucosa moist.  Neck: Supple. No JVD, carotid bruit or thyromegaly.  Lymphatics: No lymphadenopathy.  Respiratory system: Clear to auscultation. No increased work of breathing.  Cardiovascular system: S1 and S2 heard, RRR. No JVD, murmurs, gallops, clicks or pedal edema.  Gastrointestinal system: Abdomen is soft, tender right side abdomen, no rigidity.   Central nervous system: Alert and oriented. No focal neurological deficits.  Extremities: Symmetric 5 x 5 power. Peripheral pulses symmetrically felt.   Skin: No rashes or acute findings.  Musculoskeletal system: Negative exam.  Psychiatry: Pleasant and cooperative.   Labs on Admission:  Basic Metabolic Panel: Recent Labs  Lab 01/07/19 1440 01/09/19 0559  NA 139 139  K 3.6 3.6  CL 107 106  CO2 22 24  GLUCOSE 80 121*  BUN 13 7  CREATININE 0.65 0.72  CALCIUM 9.1 8.9   Liver Function Tests: Recent Labs  Lab 01/07/19 1440 01/09/19 0559  AST 12* 18  ALT 22 33  ALKPHOS 85 100  BILITOT 0.7 0.5  PROT 7.9 7.7  ALBUMIN 3.3* 3.1*   Recent Labs  Lab 01/07/19 1440 01/09/19 0559  LIPASE 19 18    No results for input(s): AMMONIA in the last 168 hours. CBC: Recent Labs  Lab 01/07/19 1440 01/09/19 0559  WBC 11.6* 14.4*  NEUTROABS  --  12.1*  HGB 13.1 12.8  HCT 41.5 40.4  MCV 88.1 88.6  PLT 334 372   Cardiac Enzymes: No results for input(s): CKTOTAL, CKMB, CKMBINDEX, TROPONINI in the last 168 hours.  BNP (last 3 results) No results for input(s): PROBNP in the last 8760 hours. CBG: Recent Labs  Lab 01/04/19 0811 01/04/19 1153 01/07/19 1549 01/07/19 1614 01/09/19 0628  GLUCAP 162* 56* 50* 173* 108*    Radiological Exams on Admission: Ct Abdomen Pelvis W Contrast  Result Date: 01/09/2019 CLINICAL DATA:  Right upper quadrant abdominal pain EXAM: CT ABDOMEN AND PELVIS WITH CONTRAST TECHNIQUE: Multidetector CT imaging of the abdomen and pelvis was performed using the standard protocol following bolus administration of intravenous contrast. CONTRAST:  OMNIPAQUE IOHEXOL 300 MG/ML  SOLN COMPARISON:  01/07/2019  FINDINGS: Lower chest: Minimal right basilar atelectasis. Hepatobiliary: Liver is within normal limits. Suspected layering gallbladder sludge and/or noncalcified gallstones (series 2/image 32). No associated inflammatory changes. No intrahepatic or extrahepatic ductal dilatation. Pancreas: Within normal limits. Spleen: Within normal limits. Adrenals/Urinary Tract: Adrenal glands are within normal limits. Kidneys are within normal limits. No renal, ureteral, or bladder calculi. No hydronephrosis. Bladder is within normal limits. Stomach/Bowel: Stomach is within normal limits. No evidence of bowel obstruction. However, there is subtle inflammatory changes with mild mesenteric stranding along loops of small bowel in the left mid abdomen (series 2/image 38). Additional mild mesenteric stranding with trace fluid involving loops of small bowel and cecum in the right lower quadrant (series 2/image 79). This appearance favors infectious/inflammatory enteritis. The appendix is  mildly prominent but the appearance does not favor acute appendicitis (coronal image 74). No drainable fluid collection/abscess. Vascular/Lymphatic: No evidence of abdominal aortic aneurysm. No suspicious abdominopelvic lymphadenopathy. Reproductive: Uterus is within normal limits. Bilateral ovaries are within normal limits. Other: Small volume pelvic ascites, mildly progressive, measuring within the upper limits of normal for simple fluid density. No free air. Musculoskeletal: Visualized osseous structures are within normal limits. IMPRESSION: Findings most compatible with infectious/inflammatory enteritis, possibly mildly progressive. No evidence of bowel obstruction. Mildly prominent appendix without findings favoring acute appendicitis. Small volume pelvic ascites, mildly progressive. No drainable fluid collection/abscess. No free air. Electronically Signed   By: Charline Bills M.D.   On: 01/09/2019 10:44   Ct Abdomen Pelvis W Contrast  Result Date: 01/07/2019 CLINICAL DATA:  Right lower quadrant pain EXAM: CT ABDOMEN AND PELVIS WITH CONTRAST TECHNIQUE: Multidetector CT imaging of the abdomen and pelvis was performed using the standard protocol following bolus administration of intravenous contrast. CONTRAST:  OMNIPAQUE IOHEXOL 300 MG/ML  SOLN COMPARISON:  None. FINDINGS: Lower chest: No acute abnormality. Hepatobiliary: No focal liver abnormality is seen. No gallstones, gallbladder wall thickening, or biliary dilatation. Pancreas: Unremarkable. No pancreatic ductal dilatation or surrounding inflammatory changes. Spleen: Normal in size without focal abnormality. Adrenals/Urinary Tract: Adrenal glands are unremarkable. Kidneys are normal, without renal calculi, focal lesion, or hydronephrosis. Bladder is unremarkable. Stomach/Bowel: The stomach is nonenlarged. No dilated small bowel. Mild cecal wall thickening. Negative appendix. Vascular/Lymphatic: Nonaneurysmal aorta. Multiple small mesenteric  nodes Reproductive: Uterus and bilateral adnexa are unremarkable. Other: Small amount of loculated fluid at the left adnexa. Generalized haziness and soft tissue infiltration of the mesentery. No free air Musculoskeletal: No acute or significant osseous findings. IMPRESSION: 1. Negative for acute appendicitis. 2. Generalized haziness and soft tissue stranding throughout the mesentery suggestive of diffuse edema or generalized mesenteric inflammatory process. Multiple small mesenteric nodes. Mild cecal wall thickening, potentially due to colitis. Electronically Signed   By: Jasmine Pang M.D.   On: 01/07/2019 17:08   US Abdomen Limited Ruq  Result Date: 01/09/2019 CLINICAL DATA:  Patient with right upper quadrant abdominal pain. EXAM: ULTRASOUND ABDOMEN LIMITED RIGHT UPPER QUADRANT COMPARISON:  None. FINDINGS: Gallbladder: No gallstones or wall thickening visualized. No sonographic Murphy sign noted by sonographer. Common bile duct: Diameter: 2 mm Liver: No focal lesion identified. Within normal limits in parenchymal echogenicity. Portal vein is patent on color Doppler imaging with normal direction of blood flow towards the liver. Other: None. IMPRESSION: No cholelithiasis or sonographic evidence for acute cholecystitis. Electronically Signed   By: Annia Belt M.D.   On: 01/09/2019 08:06    EKG: No EKG obtained  Assessment/Plan Principal Problem:   Enteritis Active Problems:   Type 1  diabetes mellitus without complication (HCC)   Obesity without serious comorbidity with body mass index (BMI) greater than 99th percentile for age in pediatric patient   Post traumatic stress disorder (PTSD)   1-Acute enteritis; Patient presented with abdominal pain, CT abdomen show worsening enteritis.  Enlarged appendix without any evidence of appendicitis.  Surgery has been consulted. Clear diet for now. IV ceftriaxone and Flagyl. IV fluids, as needed Zofran.  2-Diabetes type 1; -Patient will need basal  insulin, she uses 30 units of Lantus on around 44 units of U2 100 on her insulin pump.  -Her blood sugar has been low, she was a started on D5, we will continue for now. CBG later increase to 200 per family report. Will also check CBG 2 am to monitor for hypoglycemia -She needs to be on insulin because she is a type I.  Will need to monitor blood sugar closely. -We will continue with 25 units of Lantus. Patient and mother prefer to continue with lantus, and we could adjust insulin pump setting as needed. Per family, patient cbg gets very high when she is not on lantus.   3-Leukocytosis: Secondary to #1.  4-Depression; continue with Atarax, Zoloft.   DVT Prophylaxis: Lovenox Code Status: Full code Family Communication: Care discussed with mother Banker(RN)  who was at bedside, father Charity fundraiserN for [place].  Disposition Plan: Admit under observation for IV antibiotics, IV fluids.  Time spent: 75 minutes.   Alba CoryBelkys A Jawuan Robb MD [facility]   01/09/2019, 2:29 PM

## 2019-01-09 NOTE — Progress Notes (Signed)
Inpatient Diabetes Program Recommendations  AACE/ADA: New Consensus Statement on Inpatient Glycemic Control (2015)  Target Ranges:  Prepandial:   less than 140 mg/dL      Peak postprandial:   less than 180 mg/dL (1-2 hours)      Critically ill patients:  140 - 180 mg/dL   Lab Results  Component Value Date   GLUCAP 108 (H) 01/09/2019   HGBA1C 9.1 (H) 12/28/2018    Review of Glycemic Control  Diabetes history: DM1 Outpatient Diabetes medications: OmniPod, Lantus 30 units QD, metformin 1500 mg QD Current orders for Inpatient glycemic control: OmniPod, Lantus 25 units QD  Family insistent on pt getting Lantus along with continuation of OmniPod.  CBG 173 mg/dL before dinner today. Spoke with RN regarding insulin pump documentation  Inpatient Diabetes Program Recommendations:     Adjust Lantus daily. Watch for hypo/hyperglycemia  Will follow closely.  Thank you. Lorenda Peck, RD, LDN, CDE Inpatient Diabetes Coordinator [phone]

## 2019-01-09 NOTE — Plan of Care (Signed)
Patient arrived from ED via stretcher; ambulated from stretcher to bed in room independently. No needs expressed at this time. Will continue to monitor.

## 2019-01-09 NOTE — ED Provider Notes (Signed)
[facility]-EMERGENCY DEPT Provider Note   CSN: [id number] Arrival date & time: 01/09/19  0543   History   Chief Complaint Chief Complaint  Patient presents with   Abdominal Pain    HPI Lydia Estrada is a 18 y.o. female with insulin dependent diabetes and obesity who presents with abdominal pain. She states that she developed right lower abdominal pain about one week ago. It has been gradually worsening. She came to the ED on 11/6 and had a CT of the abdomen/pelvis which showed "general haziness and soft tissue stranding throughout the mesentery suggestive of diffuse edema or generalized mesenteric inflammatory process. Multiple small mesenteric nodes. Mild cecal wall thickening, potentially due to colitis". Pain was controlled and she tolerated PO and was discharged home with Augmentin, Percocet, Zofran. Her mom states that the patient was complaining of severe pain all day yesterday and she didn't eat or do much. She was able to eat and drink a little bit. Over night the pain has become more severe. It is now localized in the RUQ. It constant but will intensify in waves and feel like spasms. Deep breaths make the pain worse. Pain medicine temporarily make it better. She continues to vomit. She denies fever, chills, chest pain, SOB, cough. The diarrhea has resolved. No urinary symptoms. She is sexually active with 2 female partner in the past year but denies any vaginal discharge or pelvic pain. She is currently on her period. She denies prior abdominal surgeries   HPI  Past Medical History:  Diagnosis Date   Type 1 diabetes mellitus (HCC)    Dx 03/2017, A1c 11%, presented in DKA. 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 No pertinent surgical history.   OB History    Gravida  0   Para      Term      Preterm      AB      Living        SAB      TAB      Ectopic      Multiple      Live Births               Home Medications    Prior to Admission medications   Medication Sig Start Date End Date Taking? Authorizing Provider  amoxicillin-clavulanate (AUGMENTIN) 875-125 MG tablet Take 1 tablet by mouth every 12 (twelve) hours. 01/07/19   Renne CriglerGeiple, Joshua, PA-C  ELINEST 0.3-30 MG-MCG tablet Take 0.3 mg by mouth at bedtime. 10/18/18   [provider]  glucagon 1 MG injection Follow package directions for low blood sugar. 06/01/18 06/01/19  David StallBrennan, Michael J, MD  glucose blood (FREESTYLE LITE) test strip USE TO TEST BLOOD SUGAR 6 TIMES PER DAY. 09/02/18  Hermenia Bers, NP  hydrOXYzine (ATARAX/VISTARIL) 25 MG tablet Take 1 tablet (25 mg total) by mouth 3 (three) times daily as needed for anxiety. 01/04/19   Connye Burkitt, NP  Insulin Human (INSULIN PUMP) SOLN Inject 1 each into the skin 4 (four) times daily -  before meals and at bedtime. 01/04/19   Connye Burkitt, NP  nicotine (NICODERM CQ - DOSED IN MG/24 HOURS) 21 mg/24hr patch Place 1 patch (21 mg total) onto the skin daily. 01/05/19   Connye Burkitt, NP  ondansetron (ZOFRAN ODT) 4 MG disintegrating tablet Take 1 tablet (4 mg total) by mouth every 8 (eight) hours as needed for nausea or vomiting. 01/07/19   Carlisle Cater, PA-C   oxyCODONE-acetaminophen (PERCOCET/ROXICET) 5-325 MG tablet Take 1-2 tablets by mouth every 6 (six) hours as needed for severe pain. 01/07/19   Carlisle Cater, PA-C  sertraline (ZOLOFT) 50 MG tablet Take 1 tablet (50 mg total) by mouth daily. 01/05/19   Connye Burkitt, NP  traZODone (DESYREL) 50 MG tablet Take 1 tablet (50 mg total) by mouth at bedtime as needed for sleep. 01/04/19   Connye Burkitt, NP    Family History Family History  Problem Relation Age of Onset   Diabetes Maternal Grandmother    Diabetes Maternal Grandfather     Social History Social History   Tobacco Use   Smoking status: Never Smoker   Smokeless tobacco: Never Used  Substance Use Topics   Alcohol use: No    Frequency: Never   Drug use: No     Allergies   Ibuprofen and Other   Review of Systems Review of Systems  Constitutional: Negative for chills and fever.  Respiratory: Negative for cough and shortness of breath.   Cardiovascular: Negative for chest pain.  Gastrointestinal: Positive for abdominal pain, nausea and vomiting. Negative for diarrhea.  Genitourinary: Negative for dysuria, pelvic pain, vaginal bleeding and vaginal discharge.  All other systems reviewed and are negative.    Physical Exam Updated Vital Signs BP (!) 142/63    Pulse 86    Temp 98.4 F (36.9 C) (Oral)    Resp 18    LMP 12/31/2018 (Approximate)    SpO2 95%   Physical Exam Vitals signs and nursing note reviewed.  Constitutional:      General: She is not in acute distress.    Appearance: She is well-developed. She is obese. She is not ill-appearing.     Comments: Uncomfortable appearing. Calm, cooperative  HENT:     Head: Normocephalic and atraumatic.  Eyes:     General: No scleral icterus.       Right eye: No discharge.        Left eye: No discharge.     Conjunctiva/sclera: Conjunctivae normal.     Pupils: Pupils are equal, round, and reactive to light.  Neck:     Musculoskeletal: Normal range of motion.   Cardiovascular:     Rate and Rhythm: Normal rate and regular rhythm.  Pulmonary:     Effort: Pulmonary effort is normal. No respiratory distress.     Breath sounds: Normal breath sounds.  Abdominal:     General: Abdomen is protuberant. There is no distension.     Palpations: Abdomen is soft.     Tenderness: There is abdominal tenderness (epigastric and RUQ). There is guarding.  Genitourinary:    Comments: Pelvic: No inguinal lymphadenopathy or inguinal hernia noted. Normal external genitalia. No pain with speculum insertion. Closed cervical os with  normal appearance - no rash or lesions. Bleeding from period in the vaginal vault. On bimanual examination no adnexal tenderness or cervical motion tenderness. Chaperone present during exam.   Skin:    General: Skin is warm and dry.  Neurological:     Mental Status: She is alert and oriented to person, place, and time.  Psychiatric:        Behavior: Behavior normal.      ED Treatments / Results  Labs (all labs ordered are listed, but only abnormal results are displayed) Labs Reviewed  WET PREP, GENITAL - Abnormal; Notable for the following components:      Result Value   WBC, Wet Prep HPF POC MODERATE (*)    All other components within normal limits  CBC WITH DIFFERENTIAL/PLATELET - Abnormal; Notable for the following components:   WBC 14.4 (*)    Neutro Abs 12.1 (*)    All other components within normal limits  COMPREHENSIVE METABOLIC PANEL - Abnormal; Notable for the following components:   Glucose, Bld 121 (*)    Albumin 3.1 (*)    All other components within normal limits  URINALYSIS, ROUTINE W REFLEX MICROSCOPIC - Abnormal; Notable for the following components:   APPearance HAZY (*)    Hgb urine dipstick LARGE (*)    Protein, ur 30 (*)    Leukocytes,Ua SMALL (*)    Bacteria, UA RARE (*)    All other components within normal limits  CBG MONITORING, ED - Abnormal; Notable for the following components:   Glucose-Capillary  108 (*)    All other components within normal limits  LIPASE, BLOOD  PREGNANCY, URINE  RPR  HIV ANTIBODY (ROUTINE TESTING W REFLEX)  I-STAT BETA HCG BLOOD, ED ([facility], [facility], [facility] ONLY)  GC/CHLAMYDIA PROBE AMP ([facility]) NOT AT [facility]    EKG None  Radiology Ct Abdomen Pelvis W Contrast  Result Date: 01/07/2019 CLINICAL DATA:  Right lower quadrant pain EXAM: CT ABDOMEN AND PELVIS WITH CONTRAST TECHNIQUE: Multidetector CT imaging of the abdomen and pelvis was performed using the standard protocol following bolus administration of intravenous contrast. CONTRAST:  OMNIPAQUE IOHEXOL 300 MG/ML  SOLN COMPARISON:  None. FINDINGS: Lower chest: No acute abnormality. Hepatobiliary: No focal liver abnormality is seen. No gallstones, gallbladder wall thickening, or biliary dilatation. Pancreas: Unremarkable. No pancreatic ductal dilatation or surrounding inflammatory changes. Spleen: Normal in size without focal abnormality. Adrenals/Urinary Tract: Adrenal glands are unremarkable. Kidneys are normal, without renal calculi, focal lesion, or hydronephrosis. Bladder is unremarkable. Stomach/Bowel: The stomach is nonenlarged. No dilated small bowel. Mild cecal wall thickening. Negative appendix. Vascular/Lymphatic: Nonaneurysmal aorta. Multiple small mesenteric nodes Reproductive: Uterus and bilateral adnexa are unremarkable. Other: Small amount of loculated fluid at the left adnexa. Generalized haziness and soft tissue infiltration of the mesentery. No free air Musculoskeletal: No acute or significant osseous findings. IMPRESSION: 1. Negative for acute appendicitis. 2. Generalized haziness and soft tissue stranding throughout the mesentery suggestive of diffuse edema or generalized mesenteric inflammatory process. Multiple small mesenteric nodes. Mild cecal wall thickening, potentially due to colitis. Electronically Signed   By: Jasmine Pang M.D.   On: 01/07/2019 17:08   US Abdomen Limited Ruq  Result Date:  01/09/2019 CLINICAL DATA:  Patient with right upper quadrant abdominal pain. EXAM: ULTRASOUND ABDOMEN LIMITED RIGHT UPPER QUADRANT COMPARISON:  None. FINDINGS: Gallbladder: No gallstones or wall thickening visualized. No sonographic Murphy sign noted by sonographer. Common bile duct: Diameter: 2 mm Liver: No focal lesion identified. Within normal limits in parenchymal  echogenicity. Portal vein is patent on color Doppler imaging with normal direction of blood flow towards the liver. Other: None. IMPRESSION: No cholelithiasis or sonographic evidence for acute cholecystitis. Electronically Signed   By: Annia Belt M.D.   On: 01/09/2019 08:06    Procedures Procedures (including critical care time)  Medications Ordered in ED Medications  sodium chloride 0.9 % bolus 1,000 mL (has no administration in time range)  sodium chloride 0.9 % bolus 1,000 mL (0 mLs Intravenous Stopped 01/09/19 0730)  metoCLOPramide (REGLAN) injection 10 mg (10 mg Intravenous Given 01/09/19 0619)  fentaNYL (SUBLIMAZE) injection 50 mcg (50 mcg Intravenous Given 01/09/19 0630)  morphine 4 MG/ML injection 4 mg (4 mg Intravenous Given 01/09/19 0754)     Initial Impression / Assessment and Plan / ED Course  I have reviewed the triage vital signs and the nursing notes.  Pertinent labs & imaging results that were available during my care of the patient were reviewed by me and considered in my medical decision making (see chart for details).  18 year old female presents with worsening abdominal pain and nausea/vomiting since being seen in the ED 2 days ago. She is mildly hypertensive and has transeinet tachycardia which has resolved on recheck. Otherwise vitals are normal. She is tender in the RUQ. Will obtain labs, UA, RUQ [place] although CT 2 days ago did not show any inflammation around the gallbladder or gallstones.  CBC is remarkable for slightly worsening leukocytosis (14.4). CMP and lipase are unremarkable. UA is still pending. [place] is  negative. Shared visit with Dr. Lockie Mola. Will perform pelvic exam and obtain another CT abdomen/pelvis. Family is concerned since pt has such severe pain at home that was not controlled with Percocet.  Pelvic exam performed - appears normal.  CT shows: "Findings most compatible with infectious/inflammatory enteritis, possibly mildly progressive. No evidence of bowel obstruction. Mildly prominent appendix without findings favoring acute appendicitis. Small volume pelvic ascites, mildly progressive. No drainable fluid collection/abscess. No free air."  Discussed with family. Due to intractable pain and failure as outpatient will request inpatient management.   Discussed with Dr. Sunnie Nielsen with [facility] who will admit. Will start abx. She is requesting surgery consult due to mildly prominent appendix.  Discussed with Dr. Michaell Cowing - he states that if there is not a surgical problem on CT today they can consult on the patient tomorrow.    Final Clinical Impressions(s) / ED Diagnoses   Final diagnoses:  RUQ pain  Enteritis  Abdominal pain, unspecified abdominal location    ED Discharge Orders    None       Bethel Born, PA-C 01/09/19 1457    Virgina Norfolk, DO 01/09/19 1507

## 2019-01-09 NOTE — ED Triage Notes (Signed)
Pt presents with intense RUQ pain. States that it started severely around 330a. She was seen Friday with abdominal pain and states that symptoms were okay yesterday, but have worsened again. Also endorses vomiting. Reports that her sugars have been on the high side of normal for her.

## 2019-01-10 DIAGNOSIS — E669 Obesity, unspecified: Secondary | ICD-10-CM | POA: Diagnosis not present

## 2019-01-10 DIAGNOSIS — K529 Noninfective gastroenteritis and colitis, unspecified: Secondary | ICD-10-CM | POA: Diagnosis not present

## 2019-01-10 DIAGNOSIS — Z79899 Other long term (current) drug therapy: Secondary | ICD-10-CM | POA: Diagnosis not present

## 2019-01-10 DIAGNOSIS — F329 Major depressive disorder, single episode, unspecified: Secondary | ICD-10-CM | POA: Diagnosis present

## 2019-01-10 DIAGNOSIS — F4310 Post-traumatic stress disorder, unspecified: Secondary | ICD-10-CM | POA: Diagnosis not present

## 2019-01-10 DIAGNOSIS — K5289 Other specified noninfective gastroenteritis and colitis: Secondary | ICD-10-CM | POA: Diagnosis not present

## 2019-01-10 DIAGNOSIS — R1084 Generalized abdominal pain: Secondary | ICD-10-CM | POA: Diagnosis not present

## 2019-01-10 DIAGNOSIS — Z9641 Presence of insulin pump (external) (internal): Secondary | ICD-10-CM | POA: Diagnosis present

## 2019-01-10 DIAGNOSIS — F17200 Nicotine dependence, unspecified, uncomplicated: Secondary | ICD-10-CM | POA: Diagnosis present

## 2019-01-10 DIAGNOSIS — F332 Major depressive disorder, recurrent severe without psychotic features: Secondary | ICD-10-CM | POA: Diagnosis not present

## 2019-01-10 DIAGNOSIS — Z888 Allergy status to other drugs, medicaments and biological substances status: Secondary | ICD-10-CM | POA: Diagnosis not present

## 2019-01-10 DIAGNOSIS — E109 Type 1 diabetes mellitus without complications: Secondary | ICD-10-CM | POA: Diagnosis not present

## 2019-01-10 DIAGNOSIS — Z68.41 Body mass index (BMI) pediatric, greater than or equal to 95th percentile for age: Secondary | ICD-10-CM | POA: Diagnosis not present

## 2019-01-10 DIAGNOSIS — Z794 Long term (current) use of insulin: Secondary | ICD-10-CM | POA: Diagnosis not present

## 2019-01-10 DIAGNOSIS — Z833 Family history of diabetes mellitus: Secondary | ICD-10-CM | POA: Diagnosis not present

## 2019-01-10 LAB — GLUCOSE, CAPILLARY
Glucose-Capillary: 131 mg/dL — ABNORMAL HIGH (ref 70–99)
Glucose-Capillary: 113 mg/dL — ABNORMAL HIGH (ref 70–99)
Glucose-Capillary: 98 mg/dL (ref 70–99)
Glucose-Capillary: 61 mg/dL — ABNORMAL LOW (ref 70–99)
Glucose-Capillary: 151 mg/dL — ABNORMAL HIGH (ref 70–99)
Glucose-Capillary: 108 mg/dL — ABNORMAL HIGH (ref 70–99)
Glucose-Capillary: 51 mg/dL — ABNORMAL LOW (ref 70–99)
Glucose-Capillary: 55 mg/dL — ABNORMAL LOW (ref 70–99)
Glucose-Capillary: 156 mg/dL — ABNORMAL HIGH (ref 70–99)

## 2019-01-10 LAB — COMPREHENSIVE METABOLIC PANEL
ALT: 23 U/L (ref 0–44)
AST: 12 U/L — ABNORMAL LOW (ref 15–41)
Albumin: 2.5 g/dL — ABNORMAL LOW (ref 3.5–5.0)
Alkaline Phosphatase: 84 U/L (ref 38–126)
Anion gap: 10 (ref 5–15)
BUN: <5 mg/dL — ABNORMAL LOW (ref 6–20)
CO2: 24 mmol/L (ref 22–32)
Calcium: 8.8 mg/dL — ABNORMAL LOW (ref 8.9–10.3)
Chloride: 107 mmol/L (ref 98–111)
Creatinine, Ser: 0.72 mg/dL (ref 0.44–1.00)
GFR calc Af Amer: >60 mL/min (ref >60)
GFR calc non Af Amer: >60 mL/min (ref >60)
Glucose, Bld: 121 mg/dL — ABNORMAL HIGH (ref 70–99)
Potassium: 3.6 mmol/L (ref 3.5–5.1)
Sodium: 141 mmol/L (ref 135–145)
Total Bilirubin: 0.3 mg/dL (ref 0.3–1.2)
Total Protein: 6.2 g/dL — ABNORMAL LOW (ref 6.5–8.1)

## 2019-01-10 LAB — CBC
HCT: 38.5 % (ref 36.0–46.0)
Hemoglobin: 11.8 g/dL — ABNORMAL LOW (ref 12.0–15.0)
MCH: 27.4 pg (ref 26.0–34.0)
MCHC: 30.6 g/dL (ref 30.0–36.0)
MCV: 89.3 fL (ref 80.0–100.0)
Platelets: 353 10*3/uL (ref 150–400)
RBC: 4.31 MIL/uL (ref 3.87–5.11)
RDW: 13.4 % (ref 11.5–15.5)
WBC: 6.9 10*3/uL (ref 4.0–10.5)
nRBC: 0 % (ref 0.0–0.2)

## 2019-01-10 LAB — RPR: RPR Ser Ql: NONREACTIVE

## 2019-01-10 LAB — HIV ANTIBODY (ROUTINE TESTING W REFLEX): HIV Screen 4th Generation wRfx: NONREACTIVE

## 2019-01-10 NOTE — Consult Note (Signed)
Reason for Consult:prominant appendix on CT Referring Physician: Dr. Casper Harrison  Lydia Estrada is an 18 y.o. female.  HPI: This is an 18 year old female admitted yesterday with abdominal pain.  She was initially seen on November 6 and was placed on antibiotics for possible colitis.  She was readmitted yesterday with continuing abdominal pain.  She had a CT scan both times.  The second 1 showed findings consistent with enteritis.  Both showed a mildly prominent appendix with no evidence of acute appendicitis.  Her white blood count has now returned to normal.  She currently denies any right lower quadrant abdominal pain and reports overall that she is improving.  Past Medical History:  Diagnosis Date  . Type 1 diabetes mellitus (HCC)    Dx 03/2017, A1c 11%, presented in DKA. 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 No pertinent surgical history.  Family History  Problem Relation Age of Onset  . Diabetes Maternal Grandmother   . Diabetes Maternal Grandfather     Social History:  reports that she has never smoked. She has never used smokeless tobacco. She reports that she does not drink alcohol or use drugs.  Allergies:  Allergies  Allergen Reactions  . Ibuprofen     Throat swelling  . Other     Seaweed     Medications: I have reviewed the patient's current medications.  Results for orders placed or performed during the hospital encounter of 01/09/19 (from the past 48 hour(s))  CBC with Differential     Status: Abnormal   Collection Time: 01/09/19  5:59 AM  Result Value Ref Range   WBC 14.4 (H) 4.0 - 10.5 K/uL   RBC 4.56 3.87 - 5.11 MIL/uL   Hemoglobin 12.8 12.0 - 15.0 g/dL   HCT 55.3 74.8 - 27.0 %   MCV 88.6 80.0 - 100.0 fL   MCH 28.1 26.0 - 34.0 pg   MCHC 31.7 30.0 - 36.0 g/dL   RDW 78.6 75.4 - 49.2 %   Platelets 372 150 - 400 K/uL   nRBC 0.0 0.0 - 0.2 %   Neutrophils Relative % 84 %   Neutro Abs 12.1 (H) 1.7 - 7.7 K/uL   Lymphocytes Relative 9  %   Lymphs Abs 1.3 0.7 - 4.0 K/uL   Monocytes Relative 5 %   Monocytes Absolute 0.7 0.1 - 1.0 K/uL   Eosinophils Relative 1 %   Eosinophils Absolute 0.1 0.0 - 0.5 K/uL   Basophils Relative 0 %   Basophils Absolute 0.0 0.0 - 0.1 K/uL   Immature Granulocytes 1 %   Abs Immature Granulocytes 0.07 0.00 - 0.07 K/uL    Comment: Performed at [facility], [address] [place], [place] [address]  CMP     Status: Abnormal   Collection Time: 01/09/19  5:59 AM  Result Value Ref Range   Sodium 139 135 - 145 mmol/L   Potassium 3.6 3.5 - 5.1 mmol/L   Chloride 106 98 - 111 mmol/L   CO2 24 22 - 32 mmol/L   Glucose, Bld 121 (H) 70 - 99 mg/dL   BUN 7 6 - 20 mg/dL   Creatinine, Ser 1.21 0.44 - 1.00 mg/dL   Calcium 8.9 8.9 - 97.5 mg/dL   Total Protein 7.7 6.5 - 8.1 g/dL   Albumin 3.1 (L) 3.5 - 5.0 g/dL   AST 18 15 - 41 U/L   ALT 33 0 - 44 U/L   Alkaline Phosphatase 100  38 - 126 U/L   Total Bilirubin 0.5 0.3 - 1.2 mg/dL   GFR calc non Af Amer >60 >60 mL/min   GFR calc Af Amer >60 >60 mL/min   Anion gap 9 5 - 15    Comment: Performed at [facility], [address] [place], [place] [address]  Lipase     Status: None   Collection Time: 01/09/19  5:59 AM  Result Value Ref Range   Lipase 18 11 - 51 U/L    Comment: Performed at [facility], [address], [place], [place] [address]  Urinalysis, Routine w reflex microscopic     Status: Abnormal   Collection Time: 01/09/19  5:59 AM  Result Value Ref Range   Color, Urine YELLOW YELLOW   APPearance HAZY (A) CLEAR   Specific Gravity, Urine 1.016 1.005 - 1.030   pH 6.0 5.0 - 8.0   Glucose, UA NEGATIVE NEGATIVE mg/dL   Hgb urine dipstick LARGE (A) NEGATIVE   Bilirubin Urine NEGATIVE NEGATIVE   Ketones, ur NEGATIVE NEGATIVE mg/dL   Protein, ur 30 (A) NEGATIVE mg/dL   Nitrite NEGATIVE NEGATIVE   Leukocytes,Ua SMALL (A) NEGATIVE   RBC / HPF 6-10 0 - 5 RBC/hpf   WBC, UA 6-10 0 - 5 WBC/hpf    Bacteria, UA RARE (A) NONE SEEN   Squamous Epithelial / LPF 6-10 0 - 5   Mucus PRESENT    Budding Yeast PRESENT     Comment: Performed at [facility], [address], [place], [place] [address]  CBG monitoring, ED     Status: Abnormal   Collection Time: 01/09/19  6:28 AM  Result Value Ref Range   Glucose-Capillary 108 (H) 70 - 99 mg/dL  Wet prep, genital     Status: Abnormal   Collection Time: 01/09/19  8:35 AM   Specimen: Vaginal  Result Value Ref Range   Yeast Wet Prep HPF POC NONE SEEN NONE SEEN   Trich, Wet Prep NONE SEEN NONE SEEN   Clue Cells Wet Prep HPF POC NONE SEEN NONE SEEN   WBC, Wet Prep HPF POC MODERATE (A) NONE SEEN   Sperm NONE SEEN     Comment: Performed at [facility], [address] [place], [place] [address]  Pregnancy, urine     Status: None   Collection Time: 01/09/19  8:48 AM  Result Value Ref Range   Preg Test, Ur NEGATIVE NEGATIVE    Comment:        THE SENSITIVITY OF THIS METHODOLOGY IS >20 mIU/mL. Performed at [facility], [address] [place], [place] [address]   Glucose, capillary     Status: Abnormal   Collection Time: 01/09/19  4:59 PM  Result Value Ref Range   Glucose-Capillary 173 (H) 70 - 99 mg/dL  Glucose, capillary     Status: Abnormal   Collection Time: 01/09/19 10:10 PM  Result Value Ref Range   Glucose-Capillary 131 (H) 70 - 99 mg/dL  Glucose, capillary     Status: Abnormal   Collection Time: 01/10/19  2:11 AM  Result Value Ref Range   Glucose-Capillary 113 (H) 70 - 99 mg/dL  RPR     Status: None   Collection Time: 01/10/19  2:51 AM  Result Value Ref Range   RPR Ser Ql NON REACTIVE NON REACTIVE    Comment: Performed at [facility], [address] [place], [place] [address]  HIV Antibody (routine testing  w rflx)     Status: None   Collection Time: 01/10/19  2:51 AM  Result Value Ref Range   HIV Screen 4th Generation wRfx NON REACTIVE NON REACTIVE     Comment: Performed at [facility], [address] [place], [place] [address]  CBC     Status: Abnormal   Collection Time: 01/10/19  2:51 AM  Result Value Ref Range   WBC 6.9 4.0 - 10.5 K/uL   RBC 4.31 3.87 - 5.11 MIL/uL   Hemoglobin 11.8 (L) 12.0 - 15.0 g/dL   HCT 52.838.5 41.336.0 - 24.446.0 %   MCV 89.3 80.0 - 100.0 fL   MCH 27.4 26.0 - 34.0 pg   MCHC 30.6 30.0 - 36.0 g/dL   RDW 01.013.4 27.211.5 - 53.615.5 %   Platelets 353 150 - 400 K/uL   nRBC 0.0 0.0 - 0.2 %    Comment: Performed at [facility], [address] [place], [place] [address]  Comprehensive metabolic panel     Status: Abnormal   Collection Time: 01/10/19  2:51 AM  Result Value Ref Range   Sodium 141 135 - 145 mmol/L   Potassium 3.6 3.5 - 5.1 mmol/L   Chloride 107 98 - 111 mmol/L   CO2 24 22 - 32 mmol/L   Glucose, Bld 121 (H) 70 - 99 mg/dL   BUN <5 (L) 6 - 20 mg/dL   Creatinine, Ser 4.740.72 0.44 - 1.00 mg/dL   Calcium 8.8 (L) 8.9 - 10.3 mg/dL   Total Protein 6.2 (L) 6.5 - 8.1 g/dL   Albumin 2.5 (L) 3.5 - 5.0 g/dL   AST 12 (L) 15 - 41 U/L   ALT 23 0 - 44 U/L   Alkaline Phosphatase 84 38 - 126 U/L   Total Bilirubin 0.3 0.3 - 1.2 mg/dL   GFR calc non Af Amer >60 >60 mL/min   GFR calc Af Amer >60 >60 mL/min   Anion gap 10 5 - 15    Comment: Performed at [facility], [address] [place], [place] [address]  Glucose, capillary     Status: None   Collection Time: 01/10/19  7:35 AM  Result Value Ref Range   Glucose-Capillary 98 70 - 99 mg/dL  Glucose, capillary     Status: Abnormal   Collection Time: 01/10/19 11:43 AM  Result Value Ref Range   Glucose-Capillary 61 (L) 70 - 99 mg/dL  Glucose, capillary     Status: Abnormal   Collection Time: 01/10/19 12:44 PM  Result Value Ref Range   Glucose-Capillary 151 (H) 70 - 99 mg/dL  Glucose, capillary     Status: Abnormal   Collection Time: 01/10/19  4:30 PM  Result Value Ref Range   Glucose-Capillary 51 (L) 70 - 99 mg/dL  Glucose, capillary     Status:  Abnormal   Collection Time: 01/10/19  4:58 PM  Result Value Ref Range   Glucose-Capillary 55 (L) 70 - 99 mg/dL   Comment 1 Notify RN    Comment 2 Document in Chart   Glucose, capillary     Status: Abnormal   Collection Time: 01/10/19  5:17 PM  Result Value Ref Range   Glucose-Capillary 108 (H) 70 - 99 mg/dL    Ct Abdomen Pelvis W Contrast  Result Date: 01/09/2019 CLINICAL DATA:  Right upper quadrant abdominal pain EXAM: CT ABDOMEN AND PELVIS WITH CONTRAST TECHNIQUE: Multidetector CT imaging of the abdomen and pelvis was performed using the standard protocol  following bolus administration of intravenous contrast. CONTRAST:  156mL OMNIPAQUE IOHEXOL 300 MG/ML  SOLN COMPARISON:  01/07/2019 FINDINGS: Lower chest: Minimal right basilar atelectasis. Hepatobiliary: Liver is within normal limits. Suspected layering gallbladder sludge and/or noncalcified gallstones (series 2/image 32). No associated inflammatory changes. No intrahepatic or extrahepatic ductal dilatation. Pancreas: Within normal limits. Spleen: Within normal limits. Adrenals/Urinary Tract: Adrenal glands are within normal limits. Kidneys are within normal limits. No renal, ureteral, or bladder calculi. No hydronephrosis. Bladder is within normal limits. Stomach/Bowel: Stomach is within normal limits. No evidence of bowel obstruction. However, there is subtle inflammatory changes with mild mesenteric stranding along loops of small bowel in the left mid abdomen (series 2/image 38). Additional mild mesenteric stranding with trace fluid involving loops of small bowel and cecum in the right lower quadrant (series 2/image 79). This appearance favors infectious/inflammatory enteritis. The appendix is mildly prominent but the appearance does not favor acute appendicitis (coronal image 74). No drainable fluid collection/abscess. Vascular/Lymphatic: No evidence of abdominal aortic aneurysm. No suspicious abdominopelvic lymphadenopathy. Reproductive: Uterus  is within normal limits. Bilateral ovaries are within normal limits. Other: Small volume pelvic ascites, mildly progressive, measuring within the upper limits of normal for simple fluid density. No free air. Musculoskeletal: Visualized osseous structures are within normal limits. IMPRESSION: Findings most compatible with infectious/inflammatory enteritis, possibly mildly progressive. No evidence of bowel obstruction. Mildly prominent appendix without findings favoring acute appendicitis. Small volume pelvic ascites, mildly progressive. No drainable fluid collection/abscess. No free air. Electronically Signed   By: Julian Hy M.D.   On: 01/09/2019 10:44   US Abdomen Limited Ruq  Result Date: 01/09/2019 CLINICAL DATA:  Patient with right upper quadrant abdominal pain. EXAM: ULTRASOUND ABDOMEN LIMITED RIGHT UPPER QUADRANT COMPARISON:  None. FINDINGS: Gallbladder: No gallstones or wall thickening visualized. No sonographic Murphy sign noted by sonographer. Common bile duct: Diameter: 2 mm Liver: No focal lesion identified. Within normal limits in parenchymal echogenicity. Portal vein is patent on color Doppler imaging with normal direction of blood flow towards the liver. Other: None. IMPRESSION: No cholelithiasis or sonographic evidence for acute cholecystitis. Electronically Signed   By: Lovey Newcomer M.D.   On: 01/09/2019 08:06    Review of Systems  Gastrointestinal: Positive for abdominal pain, diarrhea, nausea and vomiting.  Genitourinary: Negative for dysuria.  All other systems reviewed and are negative.  Blood pressure 123/73, pulse 85, temperature 98.6 F (37 C), temperature source Oral, resp. rate 18, height 5\' 8"  (1.727 m), weight (!) 150.1 kg, last menstrual period 12/31/2018, SpO2 96 %. Physical Exam  Constitutional: She is oriented to person, place, and time. She appears well-developed and well-nourished. No distress.  Cardiovascular: Normal rate, regular rhythm, normal heart sounds  and intact distal pulses.  GI: Soft. She exhibits no distension. There is no rebound and no guarding.  Obese.  There is no right lower quadrant abdominal tenderness or peritonitis.  Neurological: She is alert and oriented to person, place, and time.  Skin: She is not diaphoretic.  Psychiatric: Her behavior is normal. Judgment normal.    Assessment/Plan: Enteritis  I have reviewed the CT scans of her abdomen and pelvis.  This shows only a mildly prominent appendix.  It is not really dilated.  There is no appendicolith.  There is no periappendiceal stranding or evidence of acute appendicitis.  Her physical examination is also not consistent with appendicitis and her white blood count has returned to normal.  From a surgical standpoint, there is no current indication for an appendectomy.  This should not need any further follow-up from our standpoint.  I explained this to the patient and her family in the room.  General surgery will see her as needed.  Abigail Miyamoto 01/10/2019, 7:40 PM

## 2019-01-10 NOTE — Progress Notes (Signed)
PROGRESS NOTE    Lydia Estrada  VWU:[id number] DOB: 07/03/2000 DOA: 01/09/2019 PCP: Lenell AntuLe, [name], DO   Brief Narrative:  This 18 y.o. female with past medical history significant for diabetes type 1 on insulin pump and Lantus at the same time, patient uses 30 units of Lantus daily and almost 44 units of U200 insulin pump. patient presents with worsening abdominal pain since 4 AM.  Patient was evaluated on 11/6 for abdominal pain, she was diagnosed with mild colitis, she was discharged home on oral Augmentin and pain medication.  Patient took 1 day of the oral antibiotic and presented again to the emergency department complaining of worsening abdominal pain.  She vomits once.  She had an episode of diarrhea few days ago which had resolved.  Patient has not been able to tolerate oral antibiotics, due to vomiting. CT abdomen: Showed;Findings most compatible with infectious/inflammatory enteritis, possibly mildly progressive. No evidence of bowel obstruction. Mildly prominent appendix without findings favoring acute Appendicitis. Small volume pelvic ascites, mildly progressive. No drainable fluid collection/abscess. No free air.   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  Enlarged appendix without any evidence of appendicitis.  Surgery has been consulted. Will f/u recommendation. Clear diet for now and advance as tolerated IV ceftriaxone and Flagyl. IV fluids, as needed Zofran.  2-Diabetes type 1; -Patient will need basal insulin, she uses 30 units of Lantus on around 44 units of U2 100 on her insulin pump.  -Her blood sugar has been low, she was a  started on D5, we will continue for now. CBG later increase to 200 per family report. Will also check CBG 2 am to monitor for hypoglycemia -She needs to be on insulin because she is a type I.  Will need to monitor blood sugar closely. -We will continue with 25 units of Lantus. Patient and mother prefer to continue with lantus, and we could adjust insulin pump setting as needed. Per family, patient cbg gets very high when she is not on lantus.   3-Leukocytosis: Secondary to #1.  4-Depression; continue with Atarax, Zoloft.    DVT prophylaxis: Lovenox Code Status: Full code Family Communication: (Care discussed with mother and the patient. Disposition Plan: Possible discharge tomorrow if she remains pain-free and tolerating diet   Consultants:   None   Procedures: None  Antimicrobials:  Anti-infectives (From admission, onward)   Start     Dose/Rate Route Frequency Ordered Stop   01/10/19 1200  cefTRIAXone (ROCEPHIN) 2 g in sodium chloride 0.9 % 100 mL IVPB     2 g 200 mL/hr over 30 Minutes Intravenous Every 24 hours 01/09/19 1222     01/09/19 2000  metroNIDAZOLE (FLAGYL) IVPB 500 mg     500 mg 100 mL/hr over 60 Minutes Intravenous Every 8 hours 01/09/19 1223     01/09/19 1145  cefTRIAXone (ROCEPHIN) 2 g in sodium chloride 0.9 % 100 mL IVPB     2 g 200 mL/hr over 30 Minutes Intravenous  Once 01/09/19 1133 01/09/19 1220   01/09/19 1145  metroNIDAZOLE (FLAGYL) IVPB 500 mg     500 mg 100 mL/hr over 60 Minutes Intravenous  Once 01/09/19 1133 01/09/19  1315      Subjective: Patient was seen and examined at bedside, she still complains of abdominal pain but denies nausea and vomiting, tolerating clear liquid diet.  Objective: Vitals:   01/09/19 1811 01/09/19 2103 01/10/19 0615 01/10/19 1353  BP: 112/60 132/79 115/67 123/73  Pulse: 94 72 65 85  Resp: (!) 22 18 18 18   Temp: 99 F (37.2 C) 99.1 F (37.3 C) 98.1 F (36.7 C) 98.6 F (37 C)  TempSrc: Oral Oral Oral Oral  SpO2:   97% 96% 96%  Weight: (!) 150.1 kg     Height: 5\' 8"  (1.727 m)       Intake/Output Summary (Last 24 hours) at 01/10/2019 1828 Last data filed at 01/10/2019 1400 Gross per 24 hour  Intake 1260 ml  Output 1600 ml  Net -340 ml   Filed Weights   01/09/19 1811  Weight: (!) 150.1 kg    Examination:  General exam: Appears calm and comfortable  Respiratory system: Clear to auscultation. Respiratory effort normal. Cardiovascular system: S1 & S2 heard, RRR. No JVD, murmurs, rubs, gallops or clicks. No pedal edema. Gastrointestinal system: Abdomen is nondistended, soft and nontender. No organomegaly or masses felt. Normal bowel sounds heard. Central nervous system: Alert and oriented. No focal neurological deficits. Extremities: Symmetric 5 x 5 power. Skin: No rashes, lesions or ulcers Psychiatry: Judgement and insight appear normal. Mood & affect appropriate.     Data Reviewed: I have personally reviewed following labs and imaging studies  CBC: Recent Labs  Lab 01/07/19 1440 01/09/19 0559 01/10/19 0251  WBC 11.6* 14.4* 6.9  NEUTROABS  --  12.1*  --   HGB 13.1 12.8 11.8*  HCT 41.5 40.4 38.5  MCV 88.1 88.6 89.3  PLT 334 372 353   Basic Metabolic Panel: Recent Labs  Lab 01/07/19 1440 01/09/19 0559 01/10/19 0251  NA 139 139 141  K 3.6 3.6 3.6  CL 107 106 107  CO2 22 24 24   GLUCOSE 80 121* 121*  BUN 13 7 <5*  CREATININE 0.65 0.72 0.72  CALCIUM 9.1 8.9 8.8*   GFR: Estimated Creatinine Clearance: 177.2 mL/min (by C-G formula based on SCr of 0.72 mg/dL). Liver Function Tests: Recent Labs  Lab 01/07/19 1440 01/09/19 0559 01/10/19 0251  AST 12* 18 12*  ALT 22 33 23  ALKPHOS 85 100 84  BILITOT 0.7 0.5 0.3  PROT 7.9 7.7 6.2*  ALBUMIN 3.3* 3.1* 2.5*   Recent Labs  Lab 01/07/19 1440 01/09/19 0559  LIPASE 19 18   No results for input(s): AMMONIA in the last 168 hours. Coagulation Profile: No results for input(s): INR, PROTIME in the last 168 hours. Cardiac  Enzymes: No results for input(s): CKTOTAL, CKMB, CKMBINDEX, TROPONINI in the last 168 hours. BNP (last 3 results) No results for input(s): PROBNP in the last 8760 hours. HbA1C: No results for input(s): HGBA1C in the last 72 hours. CBG: Recent Labs  Lab 01/10/19 1143 01/10/19 1244 01/10/19 1630 01/10/19 1658 01/10/19 1717  GLUCAP 61* 151* 51* 55* 108*   Lipid Profile: No results for input(s): CHOL, HDL, LDLCALC, TRIG, CHOLHDL, LDLDIRECT in the last 72 hours. Thyroid Function Tests: No results for input(s): TSH, T4TOTAL, FREET4, T3FREE, THYROIDAB in the last 72 hours. Anemia Panel: No results for input(s): VITAMINB12, FOLATE, FERRITIN, TIBC, IRON, RETICCTPCT in the last 72 hours. Sepsis Labs: No results for input(s): PROCALCITON, LATICACIDVEN in the last 168 hours.  Recent Results (from the past 240 hour(s))  Wet prep, genital  Status: Abnormal   Collection Time: 01/09/19  8:35 AM   Specimen: Vaginal  Result Value Ref Range Status   Yeast Wet Prep HPF POC NONE SEEN NONE SEEN Final   Trich, Wet Prep NONE SEEN NONE SEEN Final   Clue Cells Wet Prep HPF POC NONE SEEN NONE SEEN Final   WBC, Wet Prep HPF POC MODERATE (A) NONE SEEN Final   Sperm NONE SEEN  Final    Comment: Performed at [facility], [place] [address], [place], [place] [address]         Radiology Studies: Ct Abdomen Pelvis W Contrast  Result Date: 01/09/2019 CLINICAL DATA:  Right upper quadrant abdominal pain EXAM: CT ABDOMEN AND PELVIS WITH CONTRAST TECHNIQUE: Multidetector CT imaging of the abdomen and pelvis was performed using the standard protocol following bolus administration of intravenous contrast. CONTRAST:  134mL OMNIPAQUE IOHEXOL 300 MG/ML  SOLN COMPARISON:  01/07/2019 FINDINGS: Lower chest: Minimal right basilar atelectasis. Hepatobiliary: Liver is within normal limits. Suspected layering gallbladder sludge and/or noncalcified gallstones (series 2/image 32). No associated  inflammatory changes. No intrahepatic or extrahepatic ductal dilatation. Pancreas: Within normal limits. Spleen: Within normal limits. Adrenals/Urinary Tract: Adrenal glands are within normal limits. Kidneys are within normal limits. No renal, ureteral, or bladder calculi. No hydronephrosis. Bladder is within normal limits. Stomach/Bowel: Stomach is within normal limits. No evidence of bowel obstruction. However, there is subtle inflammatory changes with mild mesenteric stranding along loops of small bowel in the left mid abdomen (series 2/image 38). Additional mild mesenteric stranding with trace fluid involving loops of small bowel and cecum in the right lower quadrant (series 2/image 79). This appearance favors infectious/inflammatory enteritis. The appendix is mildly prominent but the appearance does not favor acute appendicitis (coronal image 74). No drainable fluid collection/abscess. Vascular/Lymphatic: No evidence of abdominal aortic aneurysm. No suspicious abdominopelvic lymphadenopathy. Reproductive: Uterus is within normal limits. Bilateral ovaries are within normal limits. Other: Small volume pelvic ascites, mildly progressive, measuring within the upper limits of normal for simple fluid density. No free air. Musculoskeletal: Visualized osseous structures are within normal limits. IMPRESSION: Findings most compatible with infectious/inflammatory enteritis, possibly mildly progressive. No evidence of bowel obstruction. Mildly prominent appendix without findings favoring acute appendicitis. Small volume pelvic ascites, mildly progressive. No drainable fluid collection/abscess. No free air. Electronically Signed   By: Julian Hy M.D.   On: 01/09/2019 10:44   US Abdomen Limited Ruq  Result Date: 01/09/2019 CLINICAL DATA:  Patient with right upper quadrant abdominal pain. EXAM: ULTRASOUND ABDOMEN LIMITED RIGHT UPPER QUADRANT COMPARISON:  None. FINDINGS: Gallbladder: No gallstones or wall thickening  visualized. No sonographic Murphy sign noted by sonographer. Common bile duct: Diameter: 2 mm Liver: No focal lesion identified. Within normal limits in parenchymal echogenicity. Portal vein is patent on color Doppler imaging with normal direction of blood flow towards the liver. Other: None. IMPRESSION: No cholelithiasis or sonographic evidence for acute cholecystitis. Electronically Signed   By: Lovey Newcomer M.D.   On: 01/09/2019 08:06        Scheduled Meds: . enoxaparin (LOVENOX) injection  60 mg Subcutaneous Q24H  . insulin glargine  25 Units Subcutaneous Daily  . insulin pump   Subcutaneous TID WC, HS, 0200  . nicotine  21 mg Transdermal Daily  . sertraline  50 mg Oral Daily   Continuous Infusions: . cefTRIAXone (ROCEPHIN)  IV 2 g (01/10/19 1406)  . dextrose 5 % and 0.9% NaCl 35 mL/hr at 01/10/19 1654  . metronidazole 500 mg (01/10/19  1246)     LOS: 0 days    Time spent:     Cipriano Bunker, MD [facility]   If 7PM-7AM, please contact night-coverage

## 2019-01-11 ENCOUNTER — Ambulatory Visit (INDEPENDENT_AMBULATORY_CARE_PROVIDER_SITE_OTHER): Payer: 59 | Admitting: Licensed Clinical Social Worker

## 2019-01-11 DIAGNOSIS — E669 Obesity, unspecified: Secondary | ICD-10-CM

## 2019-01-11 DIAGNOSIS — E109 Type 1 diabetes mellitus without complications: Secondary | ICD-10-CM

## 2019-01-11 DIAGNOSIS — F4310 Post-traumatic stress disorder, unspecified: Secondary | ICD-10-CM

## 2019-01-11 DIAGNOSIS — Z68.41 Body mass index (BMI) pediatric, greater than or equal to 95th percentile for age: Secondary | ICD-10-CM

## 2019-01-11 DIAGNOSIS — F332 Major depressive disorder, recurrent severe without psychotic features: Secondary | ICD-10-CM

## 2019-01-11 LAB — COMPREHENSIVE METABOLIC PANEL
ALT: 22 U/L (ref 0–44)
AST: 16 U/L (ref 15–41)
Albumin: 2.7 g/dL — ABNORMAL LOW (ref 3.5–5.0)
Alkaline Phosphatase: 81 U/L (ref 38–126)
Anion gap: 9 (ref 5–15)
BUN: 6 mg/dL (ref 6–20)
CO2: 24 mmol/L (ref 22–32)
Calcium: 9 mg/dL (ref 8.9–10.3)
Chloride: 108 mmol/L (ref 98–111)
Creatinine, Ser: 0.74 mg/dL (ref 0.44–1.00)
GFR calc Af Amer: >60 mL/min (ref >60)
GFR calc non Af Amer: >60 mL/min (ref >60)
Glucose, Bld: 92 mg/dL (ref 70–99)
Potassium: 3.5 mmol/L (ref 3.5–5.1)
Sodium: 141 mmol/L (ref 135–145)
Total Bilirubin: 0.1 mg/dL — ABNORMAL LOW (ref 0.3–1.2)
Total Protein: 7.1 g/dL (ref 6.5–8.1)

## 2019-01-11 LAB — CBC
HCT: 39.2 % (ref 36.0–46.0)
Hemoglobin: 11.9 g/dL — ABNORMAL LOW (ref 12.0–15.0)
MCH: 26.9 pg (ref 26.0–34.0)
MCHC: 30.4 g/dL (ref 30.0–36.0)
MCV: 88.5 fL (ref 80.0–100.0)
Platelets: 405 10*3/uL — ABNORMAL HIGH (ref 150–400)
RBC: 4.43 MIL/uL (ref 3.87–5.11)
RDW: 13.1 % (ref 11.5–15.5)
WBC: 6.6 10*3/uL (ref 4.0–10.5)
nRBC: 0 % (ref 0.0–0.2)

## 2019-01-11 LAB — GC/CHLAMYDIA PROBE AMP (~~LOC~~) NOT AT ARMC
Chlamydia: POSITIVE — AB
Neisseria Gonorrhea: NEGATIVE

## 2019-01-11 LAB — GLUCOSE, CAPILLARY
Glucose-Capillary: 75 mg/dL (ref 70–99)
Glucose-Capillary: 114 mg/dL — ABNORMAL HIGH (ref 70–99)
Glucose-Capillary: 71 mg/dL (ref 70–99)
Glucose-Capillary: 271 mg/dL — ABNORMAL HIGH (ref 70–99)

## 2019-01-11 LAB — MAGNESIUM: Magnesium: 2.1 mg/dL (ref 1.7–2.4)

## 2019-01-11 MED ORDER — POTASSIUM CHLORIDE 20 MEQ PO PACK
20.0000 meq | PACK | Freq: Once | ORAL | Status: AC
  Administered 2019-01-11: 20 meq via ORAL
  Filled 2019-01-11: qty 1

## 2019-01-11 MED ORDER — METRONIDAZOLE 500 MG PO TABS: 500.0000 mg | ORAL_TABLET | Freq: Three times a day (TID) | ORAL | 0 refills | Status: AC

## 2019-01-11 MED ORDER — CEFDINIR 300 MG PO CAPS: 300.0000 mg | ORAL_CAPSULE | Freq: Two times a day (BID) | ORAL | 0 refills | Status: AC

## 2019-01-11 MED FILL — CEFDINIR 300 MG CAPSULE: 300 | 7 days supply | Qty: 14 | Fill #0

## 2019-01-11 MED FILL — METRONIDAZOLE 500 MG TABS: 500 | 7 days supply | Qty: 21 | Fill #0

## 2019-01-11 NOTE — Discharge Summary (Addendum)
Physician Discharge Summary  Lydia Estrada:[id number] DOB: 05-14-2000 DOA: 01/09/2019  PCP: Lenell Antu, DO  Admit date: 01/09/2019 Discharge date: 01/11/2019  Admitted From: Home Disposition: Home  Recommendations for Outpatient Follow-up:  1. Follow up with PCP in 1-2 weeks 2. Please obtain BMP/CBC in one week 3. Please follow up on the following pending results:  Home Health: No Equipment/Devices: No  Discharge Condition: Stable CODE STATUS: Full Diet recommendation: Carb Modified  Brief/Interim Summary: Patient is a 18 y.o.femalewith past medical history significant for diabetes type 1 on insulin pump and Lantus at the same time,patient uses 30 units of Lantus daily and almost 44 units of U200 insulin pump. She presented with worsening abdominal pain since 4 AM.Patient was evaluated on 11/69for abdominal pain,she was diagnosed with mild colitis, she was discharged home on oral Augmentin and pain medication. Patient took 1 day of the oral antibiotic and presented again to the emergency department complaining of worsening abdominal pain. She vomited once. She had an episode of diarrhea few days ago which had resolved.Patient has not been able to tolerate oral antibiotics, due to vomiting. CT abdomen: Showed findings most compatible with infectious/inflammatory enteritis,possibly mildly progressive. No evidence of bowel obstruction.Mildly prominent appendix without findings favoring acuteAppendicitis. Small volume pelvic ascites, mildly progressive. No drainable fluidcollection/abscess. No free air.  Discharge Diagnoses:  Principal Problem:   Enteritis Active Problems:   Type 1 diabetes mellitus without complication (HCC)   Obesity without serious comorbidity with body mass index (BMI) greater than 99th percentile for age in pediatric patient   Post traumatic stress disorder (PTSD)  Following is a patient hospital course as problem list:  1-Acute  enteritis; Patient presented with abdominal pain, CT abdomen showed worsening enteritis. Enlarged appendix without any evidence of appendicitis. Surgery consulted.   She was seen by surgery and they do not think her examination was consistent with appendicitis.  Per surgery no indication for appendectomy at this time.  She was treated with IV ceftriaxone and Flagyl.  She improved significantly with the treatment.  She is able to tolerate regular diet at this time.  She is saying she wants to be discharged home.  She is being discharged on oral antibiotics to complete the treatment course. -Advised her to come back to the emergency room if her symptoms recur or worsen. -Also advised her to follow-up with her primary care physician.  2-Diabetes type 1; -Patient will need basal insulin, she uses 30 units of Lantus on around 44 units of U2 100 on her insulin pump. -Her blood sugar was low,she was a started on D5. CBG later increase to 200 per family report.  -She needs to be on insulin because she is a type I. Patient and mother prefer to continue with lantus, and we could adjust insulin pump setting as needed. Per family, patient cbg gets very high when she is not on lantus. They prefer for her to be discharged on her home medications.  She is advised to follow-up with her outpatient physician.  3-Leukocytosis: Secondary to #1.  WBC count normalized.  Afebrile at this time.  4-Depression;continue with Atarax, Zoloft.  Stable at this time.  She is advised to follow-up with her outpatient physician.  Discharge Instructions Discharge plan of care discussed with the patient and her mother at the bedside.  Answered all of their questions appropriately to the best of my knowledge.  They verbalized clear understanding and she is being discharged home in stable condition.  Allergies as of  01/11/2019      Reactions   Ibuprofen    Throat swelling   Other    Seaweed       Medication List     STOP taking these medications   acetaminophen 325 MG tablet Commonly known as: TYLENOL   amoxicillin-clavulanate 875-125 MG tablet Commonly known as: AUGMENTIN   nicotine 21 mg/24hr patch Commonly known as: NICODERM CQ - dosed in mg/24 hours     TAKE these medications   cefdinir 300 MG capsule Commonly known as: OMNICEF Take 1 capsule (300 mg total) by mouth 2 (two) times daily for 7 days.   Elinest 0.3-30 MG-MCG tablet Generic drug: norgestrel-ethinyl estradiol Take 0.3 mg by mouth at bedtime.   FREESTYLE LITE test strip Generic drug: glucose blood USE TO TEST BLOOD SUGAR 6 TIMES PER DAY.   glucagon 1 MG injection Follow package directions for low blood sugar.   hydrOXYzine 25 MG tablet Commonly known as: ATARAX/VISTARIL Take 1 tablet (25 mg total) by mouth 3 (three) times daily as needed for anxiety.   insulin glargine 100 UNIT/ML injection Commonly known as: LANTUS Inject 30 Units into the skin daily.   insulin pump Soln Inject 1 each into the skin 4 (four) times daily -  before meals and at bedtime. What changed:   when to take this  additional instructions   metFORMIN 500 MG 24 hr tablet Commonly known as: GLUCOPHAGE-XR Take 1,500 mg by mouth daily with breakfast.   metroNIDAZOLE 500 MG tablet Commonly known as: Flagyl Take 1 tablet (500 mg total) by mouth 3 (three) times daily for 7 days.   ondansetron 4 MG disintegrating tablet Commonly known as: Zofran ODT Take 1 tablet (4 mg total) by mouth every 8 (eight) hours as needed for nausea or vomiting.   oxyCODONE-acetaminophen 5-325 MG tablet Commonly known as: PERCOCET/ROXICET Take 1-2 tablets by mouth every 6 (six) hours as needed for severe pain.   sertraline 50 MG tablet Commonly known as: ZOLOFT Take 1 tablet (50 mg total) by mouth daily.   traZODone 50 MG tablet Commonly known as: DESYREL Take 1 tablet (50 mg total) by mouth at bedtime as needed for sleep.       Allergies  Allergen  Reactions  . Ibuprofen     Throat swelling  . Other     Seaweed     Consultations:  Surgery   Procedures/Studies: Ct Abdomen Pelvis W Contrast  Result Date: 01/09/2019 CLINICAL DATA:  Right upper quadrant abdominal pain EXAM: CT ABDOMEN AND PELVIS WITH CONTRAST TECHNIQUE: Multidetector CT imaging of the abdomen and pelvis was performed using the standard protocol following bolus administration of intravenous contrast. CONTRAST:  158mL OMNIPAQUE IOHEXOL 300 MG/ML  SOLN COMPARISON:  01/07/2019 FINDINGS: Lower chest: Minimal right basilar atelectasis. Hepatobiliary: Liver is within normal limits. Suspected layering gallbladder sludge and/or noncalcified gallstones (series 2/image 32). No associated inflammatory changes. No intrahepatic or extrahepatic ductal dilatation. Pancreas: Within normal limits. Spleen: Within normal limits. Adrenals/Urinary Tract: Adrenal glands are within normal limits. Kidneys are within normal limits. No renal, ureteral, or bladder calculi. No hydronephrosis. Bladder is within normal limits. Stomach/Bowel: Stomach is within normal limits. No evidence of bowel obstruction. However, there is subtle inflammatory changes with mild mesenteric stranding along loops of small bowel in the left mid abdomen (series 2/image 38). Additional mild mesenteric stranding with trace fluid involving loops of small bowel and cecum in the right lower quadrant (series 2/image 79). This appearance favors infectious/inflammatory enteritis. The appendix is  mildly prominent but the appearance does not favor acute appendicitis (coronal image 74). No drainable fluid collection/abscess. Vascular/Lymphatic: No evidence of abdominal aortic aneurysm. No suspicious abdominopelvic lymphadenopathy. Reproductive: Uterus is within normal limits. Bilateral ovaries are within normal limits. Other: Small volume pelvic ascites, mildly progressive, measuring within the upper limits of normal for simple fluid density.  No free air. Musculoskeletal: Visualized osseous structures are within normal limits. IMPRESSION: Findings most compatible with infectious/inflammatory enteritis, possibly mildly progressive. No evidence of bowel obstruction. Mildly prominent appendix without findings favoring acute appendicitis. Small volume pelvic ascites, mildly progressive. No drainable fluid collection/abscess. No free air. Electronically Signed   By: Charline Bills M.D.   On: 01/09/2019 10:44   Ct Abdomen Pelvis W Contrast  Result Date: 01/07/2019 CLINICAL DATA:  Right lower quadrant pain EXAM: CT ABDOMEN AND PELVIS WITH CONTRAST TECHNIQUE: Multidetector CT imaging of the abdomen and pelvis was performed using the standard protocol following bolus administration of intravenous contrast. CONTRAST:  OMNIPAQUE IOHEXOL 300 MG/ML  SOLN COMPARISON:  None. FINDINGS: Lower chest: No acute abnormality. Hepatobiliary: No focal liver abnormality is seen. No gallstones, gallbladder wall thickening, or biliary dilatation. Pancreas: Unremarkable. No pancreatic ductal dilatation or surrounding inflammatory changes. Spleen: Normal in size without focal abnormality. Adrenals/Urinary Tract: Adrenal glands are unremarkable. Kidneys are normal, without renal calculi, focal lesion, or hydronephrosis. Bladder is unremarkable. Stomach/Bowel: The stomach is nonenlarged. No dilated small bowel. Mild cecal wall thickening. Negative appendix. Vascular/Lymphatic: Nonaneurysmal aorta. Multiple small mesenteric nodes Reproductive: Uterus and bilateral adnexa are unremarkable. Other: Small amount of loculated fluid at the left adnexa. Generalized haziness and soft tissue infiltration of the mesentery. No free air Musculoskeletal: No acute or significant osseous findings. IMPRESSION: 1. Negative for acute appendicitis. 2. Generalized haziness and soft tissue stranding throughout the mesentery suggestive of diffuse edema or generalized mesenteric inflammatory  process. Multiple small mesenteric nodes. Mild cecal wall thickening, potentially due to colitis. Electronically Signed   By: Jasmine Pang M.D.   On: 01/07/2019 17:08   US Abdomen Limited Ruq  Result Date: 01/09/2019 CLINICAL DATA:  Patient with right upper quadrant abdominal pain. EXAM: ULTRASOUND ABDOMEN LIMITED RIGHT UPPER QUADRANT COMPARISON:  None. FINDINGS: Gallbladder: No gallstones or wall thickening visualized. No sonographic Murphy sign noted by sonographer. Common bile duct: Diameter: 2 mm Liver: No focal lesion identified. Within normal limits in parenchymal echogenicity. Portal vein is patent on color Doppler imaging with normal direction of blood flow towards the liver. Other: None. IMPRESSION: No cholelithiasis or sonographic evidence for acute cholecystitis. Electronically Signed   By: Annia Belt M.D.   On: 01/09/2019 08:06      Subjective: She denies having any acute complaints at this time.  She states her symptoms have resolved.  Discharge Exam: Vitals:   01/11/19 0625 01/11/19 1415  BP: 118/68 122/78  Pulse: 66 74  Resp: 18 17  Temp: 98.1 F (36.7 C) 98.7 F (37.1 C)  SpO2: 96% 98%   Vitals:   01/10/19 1353 01/10/19 2127 01/11/19 0625 01/11/19 1415  BP: 123/73 140/62 118/68 122/78  Pulse: 85 80 66 74  Resp: Temp: 98.6 F (37 C) 98.6 F (37 C) 98.1 F (36.7 C) 98.7 F (37.1 C)  TempSrc: Oral Oral Oral Oral  SpO2: 96% 94% 96% 98%  Weight:      Height:        General: Pt is alert, awake, not in acute distress Cardiovascular: RRR, S1/S2 +, no rubs, no gallops  Respiratory: CTA bilaterally, no wheezing, no rhonchi Abdominal: Obese female, soft, NT, ND, bowel sounds + Extremities: no edema, no cyanosis    The results of significant diagnostics from this hospitalization (including imaging, microbiology, ancillary and laboratory) are listed below for reference.     Microbiology: Recent Results (from the past 240 hour(s))  Wet prep, genital      Status: Abnormal   Collection Time: 01/09/19  8:35 AM   Specimen: Vaginal  Result Value Ref Range Status   Yeast Wet Prep HPF POC NONE SEEN NONE SEEN Final   Trich, Wet Prep NONE SEEN NONE SEEN Final   Clue Cells Wet Prep HPF POC NONE SEEN NONE SEEN Final   WBC, Wet Prep HPF POC MODERATE (A) NONE SEEN Final   Sperm NONE SEEN  Final    Comment: Performed at [facility], [address] [place], [place] [address]     Labs: BNP (last 3 results) No results for input(s): BNP in the last 8760 hours. Basic Metabolic Panel: Recent Labs  Lab 01/07/19 1440 01/09/19 0559 01/10/19 0251 01/11/19 0306  NA 139 139 141 141  K 3.6 3.6 3.6 3.5  CL 107 106 107 108  CO2 22 24 24 24   GLUCOSE 80 121* 121* 92  BUN 13 7 <5* 6  CREATININE 0.65 0.72 0.72 0.74  CALCIUM 9.1 8.9 8.8* 9.0  MG  --   --   --  2.1   Liver Function Tests: Recent Labs  Lab 01/07/19 1440 01/09/19 0559 01/10/19 0251 01/11/19 0306  AST 12* 18 12* 16  ALT 22 33 23 22  ALKPHOS 85 100 84 81  BILITOT 0.7 0.5 0.3 0.1*  PROT 7.9 7.7 6.2* 7.1  ALBUMIN 3.3* 3.1* 2.5* 2.7*   Recent Labs  Lab 01/07/19 1440 01/09/19 0559  LIPASE 19 18   No results for input(s): AMMONIA in the last 168 hours. CBC: Recent Labs  Lab 01/07/19 1440 01/09/19 0559 01/10/19 0251 01/11/19 0306  WBC 11.6* 14.4* 6.9 6.6  NEUTROABS  --  12.1*  --   --   HGB 13.1 12.8 11.8* 11.9*  HCT 41.5 40.4 38.5 39.2  MCV 88.1 88.6 89.3 88.5  PLT 334 372 353 405*   Cardiac Enzymes: No results for input(s): CKTOTAL, CKMB, CKMBINDEX, TROPONINI in the last 168 hours. BNP: Invalid input(s): POCBNP CBG: Recent Labs  Lab 01/10/19 2139 01/11/19 0536 01/11/19 0735 01/11/19 0817 01/11/19 1139  GLUCAP 156* 75 71 114* 271*   D-Dimer No results for input(s): DDIMER in the last 72 hours. Hgb A1c No results for input(s): HGBA1C in the last 72 hours. Lipid Profile No results for input(s): CHOL, HDL, LDLCALC, TRIG, CHOLHDL,  LDLDIRECT in the last 72 hours. Thyroid function studies No results for input(s): TSH, T4TOTAL, T3FREE, THYROIDAB in the last 72 hours.  Invalid input(s): FREET3 Anemia work up No results for input(s): VITAMINB12, FOLATE, FERRITIN, TIBC, IRON, RETICCTPCT in the last 72 hours. Urinalysis    Component Value Date/Time   COLORURINE YELLOW 01/09/2019 0559   APPEARANCEUR HAZY (A) 01/09/2019 0559   LABSPEC 1.016 01/09/2019 0559   PHURINE 6.0 01/09/2019 0559   GLUCOSEU NEGATIVE 01/09/2019 0559   HGBUR LARGE (A) 01/09/2019 0559   BILIRUBINUR NEGATIVE 01/09/2019 0559   KETONESUR NEGATIVE 01/09/2019 0559   PROTEINUR 30 (A) 01/09/2019 0559   NITRITE NEGATIVE 01/09/2019 0559   LEUKOCYTESUR SMALL (A) 01/09/2019 0559   Sepsis Labs Invalid input(s): PROCALCITONIN,  WBC,  LACTICIDVEN Microbiology Recent Results (from the  past 240 hour(s))  Wet prep, genital     Status: Abnormal   Collection Time: 01/09/19  8:35 AM   Specimen: Vaginal  Result Value Ref Range Status   Yeast Wet Prep HPF POC NONE SEEN NONE SEEN Final   Trich, Wet Prep NONE SEEN NONE SEEN Final   Clue Cells Wet Prep HPF POC NONE SEEN NONE SEEN Final   WBC, Wet Prep HPF POC MODERATE (A) NONE SEEN Final   Sperm NONE SEEN  Final    Comment: Performed at [facility], [address], [place], [place] [address]     Time coordinating discharge: Over 30 minutes  SIGNED:   Vonzella Nipple, MD  [facility] 01/11/2019, 2:43 PM Pager on amion  If 7PM-7AM, please contact night-coverage [email or web address] Password [email or web address]

## 2019-01-11 NOTE — Progress Notes (Signed)
CBG 75. °
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
## 2019-01-11 NOTE — Progress Notes (Signed)
Discharge instructions given to patient. Patient had no questions. NT or writer will wheel patient out once her dad comes to pick her up

## 2019-01-13 ENCOUNTER — Other Ambulatory Visit: Payer: Self-pay | Admitting: *Deleted

## 2019-01-13 MED FILL — AZITHROMYCIN 500 MG TABS: 500 | 1 days supply | Qty: 2 | Fill #0

## 2019-01-13 NOTE — Patient Outreach (Signed)
[facility]) Care Management  01/13/2019  Lydia Estrada [birth year] [id number]   Transition of care telephone call  Referral received:01/11/19 Initial outreach:01/13/19 Insurance: [employer]   Initial unsuccessful telephone call to patient's preferred number in order to complete transition of care assessment; no answer, left HIPAA compliant voicemail message requesting return call.   Objective: Per the electronic medical record, Lydia Estrada  was hospitalized at [facility]   From 11/8-11/10/20 for Enteritis. Comorbidities include: Type 1 Diabetes using Omnipod  insulin pump , obesity, PTSD  She was discharged to home on 01/11/19 without the need for home health services or durable medical equipment per the discharge summary.   Plan: This RNCM will route unsuccessful outreach letter with [facility] Management pamphlet and 24 hour Nurse Advice Line Magnet to [place] Management clinical pool to be mailed to patient's home address. This RNCM will attempt another outreach within 4 business days.   Joylene Draft, RN, [place] Management Coordinator  [phone]- Mobile [phone]- Toll Free Main Office

## 2019-01-14 DIAGNOSIS — Z794 Long term (current) use of insulin: Secondary | ICD-10-CM | POA: Diagnosis not present

## 2019-01-14 DIAGNOSIS — E109 Type 1 diabetes mellitus without complications: Secondary | ICD-10-CM | POA: Diagnosis not present

## 2019-01-18 ENCOUNTER — Other Ambulatory Visit: Payer: Self-pay | Admitting: *Deleted

## 2019-01-18 ENCOUNTER — Encounter: Payer: Self-pay | Admitting: *Deleted

## 2019-01-18 NOTE — Patient Outreach (Signed)
[facility]) Care Management  01/18/2019  KMARI BRIAN 22-Dec-2000 [id number]   Transition of care call/case closure   Referral received:01/11/19 Initial outreach:01/13/19 Insurance: [facility] Plan    Subjective: Initial successful telephone call to patient's preferred number in order to complete transition of care assessment; 2 HIPAA identifiers verified. Explained purpose of call and completed transition of care assessment.  States she is doing better tolerating diet, denies nausea abdominal pain, no diarrhea. She discussed completing last dose of antibiotic on today.  She reports tolerating activity in home.  She discussed having insulin pump, continuous glucose reading and average reading has been 130's she denies any concerns regarding blood glucose readings and insulin pump management .   She  denies bowel or bladder problems.  Her parents  are assisting with her  recovery.  She discussed having a call in last month, from [place] [facility] for management of chronic condition of Diabetes.  She  uses a [facility], [facility]  She denies educational needs related to staying safe during the [place] 19 pandemic.    Objective:  Jennett Tarbell  was hospitalized at [facility]   From 11/8-11/10/20 for Enteritis. Comorbidities include: Type 1 Diabetes using Omnipod  insulin pump , obesity, PTSD  She was discharged to home on 01/11/19 without the need for home health services or durable medical equipment per the discharge summary.  Assessment:  Patient voices good understanding of all discharge instructions.  See transition of care flowsheet for assessment details.   Plan:  Reviewed hospital discharge diagnosis of Enteritis   and discharge treatment plan using hospital discharge instructions, assessing medication adherence, reviewing problems requiring provider notification, and discussing the importance of  primary care provider  and/or specialists as directed. Reviewed [place] announcements that all [place] members will receive the Healthy Lifestyle Premium rate in 2021.  Using [place] website, verified that patient is an active participate in [place]'s Active Health Management chronic disease management program, noted enrolled as status date of 12/02/18.  No ongoing care management needs identified so will close case to [place] Management services and route successful outreach letter with [place] Management pamphlet and 24 Hour Nurse Line Magnet to [place] Management clinical pool to be mailed to patient's home address.    Joylene Draft, RN, [place] Management Coordinator  [phone]- Mobile [phone]- Toll Free Main Office

## 2019-01-19 DIAGNOSIS — Z7984 Long term (current) use of oral hypoglycemic drugs: Secondary | ICD-10-CM | POA: Diagnosis not present

## 2019-01-19 DIAGNOSIS — Z62810 Personal history of physical and sexual abuse in childhood: Secondary | ICD-10-CM | POA: Diagnosis not present

## 2019-01-19 DIAGNOSIS — E1065 Type 1 diabetes mellitus with hyperglycemia: Secondary | ICD-10-CM | POA: Diagnosis not present

## 2019-01-19 DIAGNOSIS — Z915 Personal history of self-harm: Secondary | ICD-10-CM | POA: Diagnosis not present

## 2019-01-19 DIAGNOSIS — F418 Other specified anxiety disorders: Secondary | ICD-10-CM | POA: Diagnosis not present

## 2019-01-19 DIAGNOSIS — Z09 Encounter for follow-up examination after completed treatment for conditions other than malignant neoplasm: Secondary | ICD-10-CM | POA: Diagnosis not present

## 2019-01-19 DIAGNOSIS — K529 Noninfective gastroenteritis and colitis, unspecified: Secondary | ICD-10-CM | POA: Diagnosis not present

## 2019-01-19 MED FILL — HUMALOG 200 UNITS/ML KWIKPE: 200 | 30 days supply | Qty: 30 | Fill #1

## 2019-01-19 MED FILL — ELINEST-28 TABLET: 0.3-30 | 84 days supply | Qty: 84 | Fill #1

## 2019-01-21 ENCOUNTER — Ambulatory Visit (INDEPENDENT_AMBULATORY_CARE_PROVIDER_SITE_OTHER): Payer: 59 | Admitting: Licensed Clinical Social Worker

## 2019-01-21 ENCOUNTER — Other Ambulatory Visit: Payer: Self-pay

## 2019-01-21 DIAGNOSIS — F4310 Post-traumatic stress disorder, unspecified: Secondary | ICD-10-CM

## 2019-01-21 DIAGNOSIS — F410 Panic disorder [episodic paroxysmal anxiety] without agoraphobia: Secondary | ICD-10-CM | POA: Diagnosis not present

## 2019-01-21 DIAGNOSIS — F332 Major depressive disorder, recurrent severe without psychotic features: Secondary | ICD-10-CM

## 2019-01-21 NOTE — Progress Notes (Signed)
   THERAPIST PROGRESS NOTE  Session Time: 11:20am-12:20pm  Participation Level: Active  Behavioral Response: CasualAlertEuthymic  Type of Therapy: Individual Therapy  Treatment Goals addressed: Coping and Diagnosis: increase use of healthy coping skills to decrease fequency and intensity of emotional outburst at least 4 days per week  Interventions: CBT, Motivational Interviewing and Supportive  Summary: Lydia Estrada is a 18 y.o. female who presents with symptoms of depression, mild substance use, and family discord. Client shared treatment goals and interactions with parents. Client is able to identify events leading up to outburst. Client shows progress toward goals AEB engaging in treatment, being receptive to information about family dynamic and willingness to try discussed communication skills.   Suicidal/Homicidal: Nowithout intent/plan  Therapist Response: Clinician met with client, assessing for SI/HI/psychosis and overall level of functioning. Clinician inquired about changes in recent stressors. Clinician and client discussed the importance of assertive communication to implement and maintain boundaries and avoid aggressive communication which lead to outburst. Client provided the option of 'I-statements' and practiced in session.  Plan: Return again in 1-2 weeks.  Diagnosis: Axis I: Major Depression, Recurrent severe      Olegario Messier, LCSW 01/21/2019

## 2019-01-26 ENCOUNTER — Other Ambulatory Visit: Payer: Self-pay

## 2019-01-26 ENCOUNTER — Ambulatory Visit (INDEPENDENT_AMBULATORY_CARE_PROVIDER_SITE_OTHER): Payer: 59 | Admitting: Psychiatry

## 2019-01-26 ENCOUNTER — Encounter: Payer: Self-pay | Admitting: Psychiatry

## 2019-01-26 DIAGNOSIS — F4310 Post-traumatic stress disorder, unspecified: Secondary | ICD-10-CM | POA: Diagnosis not present

## 2019-01-26 DIAGNOSIS — F410 Panic disorder [episodic paroxysmal anxiety] without agoraphobia: Secondary | ICD-10-CM

## 2019-01-26 DIAGNOSIS — F3341 Major depressive disorder, recurrent, in partial remission: Secondary | ICD-10-CM

## 2019-01-26 MED ORDER — SERTRALINE HCL 100 MG PO TABS: 100.0000 mg | ORAL_TABLET | Freq: Every day | ORAL | 1 refills | Status: DC

## 2019-01-26 MED ORDER — TRAZODONE HCL 50 MG PO TABS: 50.0000 mg | ORAL_TABLET | Freq: Every evening | ORAL | 1 refills | Status: DC | PRN

## 2019-01-26 MED ORDER — HYDROXYZINE HCL 25 MG PO TABS: 25.0000 mg | ORAL_TABLET | Freq: Three times a day (TID) | ORAL | 1 refills | Status: DC | PRN

## 2019-01-26 MED FILL — hydrOXYzine HCL 25 MG TABS: 25 | 20 days supply | Qty: 60 | Fill #0

## 2019-01-26 MED FILL — SERTRALINE HCL 100 MG TAB: 100 | 30 days supply | Qty: 30 | Fill #0
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
## 2019-02-04 ENCOUNTER — Ambulatory Visit (INDEPENDENT_AMBULATORY_CARE_PROVIDER_SITE_OTHER): Payer: 59 | Admitting: Licensed Clinical Social Worker

## 2019-02-04 ENCOUNTER — Other Ambulatory Visit: Payer: Self-pay

## 2019-02-04 DIAGNOSIS — F4310 Post-traumatic stress disorder, unspecified: Secondary | ICD-10-CM | POA: Diagnosis not present

## 2019-02-04 DIAGNOSIS — F410 Panic disorder [episodic paroxysmal anxiety] without agoraphobia: Secondary | ICD-10-CM

## 2019-02-04 DIAGNOSIS — F332 Major depressive disorder, recurrent severe without psychotic features: Secondary | ICD-10-CM | POA: Diagnosis not present

## 2019-02-04 NOTE — Progress Notes (Signed)
Virtual Visit via Telephone Note  I connected with Lydia Estrada on 02/04/19 at 12:00 PM EST by telephone and verified that I am speaking with the correct person using two identifiers.   I discussed the limitations, risks, security and privacy concerns of performing an evaluation and management service by telephone and the availability of in person appointments. I also discussed with the patient that there may be a patient responsible charge related to this service. The patient expressed understanding and agreed to proceed.  I discussed the assessment and treatment plan with the patient. The patient was provided an opportunity to ask questions and all were answered. The patient agreed with the plan and demonstrated an understanding of the instructions.   The patient was advised to call back or seek an in-person evaluation if the symptoms worsen or if the condition fails to improve as anticipated.  I provided 30 minutes of non-face-to-face time during this encounter.   Olegario Messier, LCSW    THERAPIST PROGRESS NOTE  Session Time: 12pm-12:30pm  Participation Level: Active  Behavioral Response: NAAlertEuthymic  Type of Therapy: Individual Therapy  Treatment Goals addressed: Coping and Diagnosis: Increase use of healthy coping skills to at least one time daily at least 3 days per week to stabilize mood.  Interventions: CBT, Motivational Interviewing and Supportive  Summary: Lydia Estrada is a 18 y.o. female who presents with improving symptoms of depression and PTSD. Client reports improvement in healthy communication with parents and decrease in yelling while feeling more heard when using I-statements as discussed. Client shared relationship with new boyfriend and planned boundaries. Client states she does not want to process trauma right now and she feels currently stable and does not want to add stressors at this time. Client agrees to contact center and re-engage in services if  symptoms worsen.   Suicidal/Homicidal: Nowithout intent/plan  Therapist Response: Clinician met with client, assessing for SI/HI/psychosis and overall level of functioning. Clinician processed with client use of assertive communication with family members and changes in dynamics. Clinician processed with client new relationship and aspects of healthy relationships and boundaries. Clinician discussed with client interest in processing trauma in addition to skills however client reports she would like to return to therapy as needed.  Plan: Return again in as needed per client request.  Diagnosis: Axis I: Major Depression, Recurrent severe and Post Traumatic Stress Disorder      Olegario Messier, LCSW 02/04/2019

## 2019-02-13 DIAGNOSIS — Z794 Long term (current) use of insulin: Secondary | ICD-10-CM | POA: Diagnosis not present

## 2019-02-13 DIAGNOSIS — E109 Type 1 diabetes mellitus without complications: Secondary | ICD-10-CM | POA: Diagnosis not present

## 2019-02-15 ENCOUNTER — Telehealth: Payer: Self-pay

## 2019-02-15 MED ORDER — LATUDA 20 MG PO TABS: ORAL_TABLET | ORAL | 1 refills | Status: DC

## 2019-02-15 NOTE — Telephone Encounter (Signed)
pt states she having panic attacks and depression spells. [phone].  Pt doesn't feel the medication is enough dosage or it the wrong kind of medication for her.

## 2019-02-15 NOTE — Telephone Encounter (Signed)
I called the patient and spoke with her.Patient reported that she has been having mood swings and periods of irritability.  She may become for a few minutes then suddenly becomes angry.  Her mood swings are worsening with time.  She stated that she feels Zoloft has not worsened anything but these mood swings are overwhelming.  She also reported that she has been taking 2 tablets of hydroxyzine together for anxiety episodes and that has been helpful. Based on her mood swings and irritability patient was offered Latuda for mood stabilization. Potential side effects of medication and risks vs benefits of treatment vs non-treatment were explained and discussed. All questions were answered.  Patient has a follow-up appointment scheduled with me on 03/23/19.  It was noted that she does not have a follow-up appointment scheduled with therapist Ms. Oneita Kras.  Patient was informed that I will reach out to Ms. Oneita Kras and make sure she has an appointment for follow-up with her.

## 2019-02-16 MED FILL — LATUDA 20 MG TABLET: 20 | 30 days supply | Qty: 30 | Fill #0

## 2019-02-21 ENCOUNTER — Telehealth (HOSPITAL_COMMUNITY): Payer: Self-pay | Admitting: Licensed Clinical Social Worker

## 2019-02-28 ENCOUNTER — Other Ambulatory Visit (INDEPENDENT_AMBULATORY_CARE_PROVIDER_SITE_OTHER): Payer: Self-pay | Admitting: Pediatrics

## 2019-02-28 MED FILL — hydrOXYzine HCL 25 MG TABS: 25 | 20 days supply | Qty: 60 | Fill #1

## 2019-02-28 MED FILL — SERTRALINE HCL 100 MG TAB: 100 | 30 days supply | Qty: 30 | Fill #1

## 2019-02-28 MED FILL — UNIFINE PENTIPS 32GX5/32": 32G X 4 MM | 75 days supply | Qty: 300 | Fill #1

## 2019-02-28 MED FILL — UNIFINE PENTIPS 32GX5/32: 32G X 4 MM | 75 days supply | Qty: 300 | Fill #1

## 2019-02-28 MED FILL — HUMALOG 200 UNITS/ML KWIKPE: 200 | 30 days supply | Qty: 24 | Fill #0

## 2019-03-02 MED FILL — LANTUS SOLOSTAR 100 UNITS/M: 100 | 90 days supply | Qty: 90 | Fill #0

## 2019-03-07 ENCOUNTER — Ambulatory Visit (INDEPENDENT_AMBULATORY_CARE_PROVIDER_SITE_OTHER): Payer: 59 | Admitting: Family

## 2019-03-07 DIAGNOSIS — E1065 Type 1 diabetes mellitus with hyperglycemia: Secondary | ICD-10-CM | POA: Diagnosis not present

## 2019-03-07 DIAGNOSIS — E10649 Type 1 diabetes mellitus with hypoglycemia without coma: Secondary | ICD-10-CM | POA: Diagnosis not present

## 2019-03-08 DIAGNOSIS — B373 Candidiasis of vulva and vagina: Secondary | ICD-10-CM | POA: Diagnosis not present

## 2019-03-08 DIAGNOSIS — A749 Chlamydial infection, unspecified: Secondary | ICD-10-CM | POA: Diagnosis not present

## 2019-03-08 DIAGNOSIS — R300 Dysuria: Secondary | ICD-10-CM | POA: Diagnosis not present

## 2019-03-08 DIAGNOSIS — Z716 Tobacco abuse counseling: Secondary | ICD-10-CM | POA: Diagnosis not present

## 2019-03-08 MED FILL — NICOTINE 14 MG/24HR PATCH: 14 | 28 days supply | Qty: 28 | Fill #0

## 2019-03-08 MED FILL — FLUCONAZOLE 150 MG TABS: 150 | 4 days supply | Qty: 2 | Fill #0

## 2019-03-14 MED FILL — DOXYCYCLINE HYC 100 MG CAPS: 100 | 7 days supply | Qty: 14 | Fill #0

## 2019-03-16 MED FILL — LATUDA 20 MG TABLET: 20 | 30 days supply | Qty: 30 | Fill #1

## 2019-03-23 ENCOUNTER — Other Ambulatory Visit: Payer: Self-pay

## 2019-03-23 ENCOUNTER — Ambulatory Visit (INDEPENDENT_AMBULATORY_CARE_PROVIDER_SITE_OTHER): Payer: 59 | Admitting: Psychiatry

## 2019-03-23 ENCOUNTER — Encounter: Payer: Self-pay | Admitting: Psychiatry

## 2019-03-23 DIAGNOSIS — F3341 Major depressive disorder, recurrent, in partial remission: Secondary | ICD-10-CM

## 2019-03-23 DIAGNOSIS — Z794 Long term (current) use of insulin: Secondary | ICD-10-CM | POA: Diagnosis not present

## 2019-03-23 DIAGNOSIS — F4310 Post-traumatic stress disorder, unspecified: Secondary | ICD-10-CM

## 2019-03-23 DIAGNOSIS — F410 Panic disorder [episodic paroxysmal anxiety] without agoraphobia: Secondary | ICD-10-CM | POA: Diagnosis not present

## 2019-03-23 DIAGNOSIS — E109 Type 1 diabetes mellitus without complications: Secondary | ICD-10-CM | POA: Diagnosis not present

## 2019-03-23 MED ORDER — SERTRALINE HCL 100 MG PO TABS: 100.0000 mg | ORAL_TABLET | Freq: Every day | ORAL | 0 refills | Status: DC

## 2019-03-23 MED ORDER — TRAZODONE HCL 50 MG PO TABS: 50.0000 mg | ORAL_TABLET | Freq: Every evening | ORAL | 0 refills | Status: DC | PRN

## 2019-03-23 MED ORDER — HYDROXYZINE HCL 25 MG PO TABS: 25.0000 mg | ORAL_TABLET | Freq: Three times a day (TID) | ORAL | 0 refills | Status: DC | PRN

## 2019-03-23 MED ORDER — LATUDA 20 MG PO TABS: ORAL_TABLET | ORAL | 0 refills | Status: DC

## 2019-03-23 MED FILL — traZODone HCL 50 MG TABS: 50 | 90 days supply | Qty: 90 | Fill #0

## 2019-03-23 MED FILL — hydrOXYzine HCL 25 MG TABS: 25 | 90 days supply | Qty: 270 | Fill #0

## 2019-03-23 NOTE — Progress Notes (Signed)
[facility] MD OP Progress Note  I connected with  Lydia Estrada on 03/23/19 by a video enabled telemedicine application and verified that I am speaking with the correct person using two identifiers.   I discussed the limitations of evaluation and management by telemedicine. The patient expressed understanding and agreed to proceed.    03/23/2019 3:38 PM Lydia Estrada  MRN:  [id number]  Chief Complaint: " I am doing much better."  HPI: Patient reported that she noticed improvement in her mood after Latuda was added to her regimen.  She stated that her energy levels have improved.  Her mood has been more stable.  She informed that the combination of sertraline and Latuda is helpful.  She has been taking hydroxyzine two  tablets of 25 mg strength every morning and that seems to help with anxiety.  She generally does not need any other extra doses of hydroxyzine during the day. She reported that she was sleeping well with trazodone however no refill was issued so she ran out of it.  She requested refills for all the medications. She is continuing to attend her classes. She was advised to resume therapy if she thinks that is important.  Visit Diagnosis:    ICD-10-CM   1. MDD (major depressive disorder), recurrent, in partial remission (HCC)  F33.41   2. Post traumatic stress disorder (PTSD)  F43.10   3. Panic disorder  F41.0     Past Psychiatric History: Depression, anxiety, PTSD  Past Medical History:  Past Medical History:  Diagnosis Date  . Type 1 diabetes mellitus (HCC)    Dx 03/2017, A1c 11%, presented in DKA. 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    Family History:  Family History  Problem Relation Age of Onset  . Diabetes Maternal Grandmother   . Diabetes Maternal Grandfather     Social History:  Social History   Socioeconomic History  . Marital status: Single    Spouse name: Not on file  . Number of children: Not on file  .  Years of education: Not on file  . Highest education level: Not on file  Occupational History  . Not on file  Tobacco Use  . Smoking status: Never Smoker  . Smokeless tobacco: Never Used  Substance and Sexual Activity  . Alcohol use: No  . Drug use: No  . Sexual activity: Not on file  Other Topics Concern  . Not on file  Social History Narrative   Lives with 2 sisters, 1 brother, mom and dad.    She is Graduating from home schooling in December and will start College in the spring semester (she is considering [employer])    Social Determinants of Corporate investment banker Strain:   . Difficulty of Paying Living Expenses: Not on file  Food Insecurity:   . Worried About Programme researcher, broadcasting/film/video in the Last Year: Not on file  . Ran Out of Food in the Last Year: Not on file  Transportation Needs:   . Lack of Transportation (Medical): Not on file  . Lack of Transportation (Non-Medical): Not on file  Physical Activity:   . Days of Exercise per Week: Not on file  . Minutes of Exercise per Session: Not on file  Stress:   . Feeling of Stress : Not on file  Social Connections:   . Frequency of Communication with Friends and Family: Not on file  . Frequency of Social Gatherings  with Friends and Family: Not on file  . Attends Religious Services: Not on file  . Active Member of Clubs or Organizations: Not on file  . Attends Archivist Meetings: Not on file  . Marital Status: Not on file    Allergies:  Allergies  Allergen Reactions  . Ibuprofen     Throat swelling  . Other     Seaweed     Metabolic Disorder Labs: Lab Results  Component Value Date   HGBA1C 9.1 (H) 12/28/2018   MPG 214.47 12/28/2018   MPG 269 03/29/2017   No results found for: PROLACTIN Lab Results  Component Value Date   CHOL 157 09/10/2018   TRIG 159 (H) 09/10/2018   HDL 51 09/10/2018   CHOLHDL 3.1 09/10/2018   [place] 80 09/10/2018   Lab Results  Component Value Date   TSH 2.791 12/28/2018    TSH 0.96 09/10/2018    Therapeutic Level Labs: No results found for: LITHIUM No results found for: VALPROATE No components found for:  CBMZ  Current Medications: Current Outpatient Medications  Medication Sig Dispense Refill  . ELINEST 0.3-30 MG-MCG tablet Take 0.3 mg by mouth at bedtime.    Marland Kitchen glucagon 1 MG injection Follow package directions for low blood sugar. 2 each 6  . glucose blood (FREESTYLE LITE) test strip USE TO TEST BLOOD SUGAR 6 TIMES PER DAY. 200 strip 5  . hydrOXYzine (ATARAX/VISTARIL) 25 MG tablet Take 1 tablet (25 mg total) by mouth 3 (three) times daily as needed for anxiety. 60 tablet 1  . insulin glargine (LANTUS) 100 UNIT/ML injection Inject 30 Units into the skin daily.    . Insulin Human (INSULIN PUMP) SOLN Inject 1 each into the skin 4 (four) times daily -  before meals and at bedtime. (Patient taking differently: Inject 1 each into the skin continuous. humalog u-200)    . LANTUS SOLOSTAR 100 UNIT/ML Solostar Pen Use up to 100 units daily 90 mL 1  . lurasidone (LATUDA) 20 MG TABS tablet Take 1 tablet with dinner 30 tablet 1  . metFORMIN (GLUCOPHAGE-XR) 500 MG 24 hr tablet Take 1,500 mg by mouth daily with breakfast.    . ondansetron (ZOFRAN ODT) 4 MG disintegrating tablet Take 1 tablet (4 mg total) by mouth every 8 (eight) hours as needed for nausea or vomiting. 10 tablet 0  . oxyCODONE-acetaminophen (PERCOCET/ROXICET) 5-325 MG tablet Take 1-2 tablets by mouth every 6 (six) hours as needed for severe pain. 10 tablet 0  . sertraline (ZOLOFT) 100 MG tablet Take 1 tablet (100 mg total) by mouth daily. 30 tablet 1  . traZODone (DESYREL) 50 MG tablet Take 1 tablet (50 mg total) by mouth at bedtime as needed for sleep. 30 tablet 1   No current facility-administered medications for this visit.      Psychiatric Specialty Exam: Review of Systems  There were no vitals taken for this visit.There is no height or weight on file to calculate BMI.  General Appearance: Fairly  Groomed  Eye Contact:  Good  Speech:  Clear and Coherent and Normal Rate  Volume:  Normal  Mood:  Euthymic  Affect:  Congruent  Thought Process:  Goal Directed, Linear and Descriptions of Associations: Intact  Orientation:  Full (Time, Place, and Person)  Thought Content: Logical   Suicidal Thoughts:  No  Homicidal Thoughts:  No  Memory:  Recent;   Good Remote;   Good  Judgement:  Fair  Insight:  Fair  Psychomotor Activity:  Normal  Concentration:  Concentration: Good and Attention Span: Good  Recall:  Good  Fund of Knowledge: Good  Language: Good  Akathisia:  Negative  Handed:  Right  AIMS (if indicated): not done  Assets:  Communication Skills Desire for Improvement Financial Resources/Insurance Housing  ADL's:  Intact  Cognition: WNL  Sleep:  Fair   Screenings: AIMS     [facility] Most recent reading at 01/04/2019 10:28 AM Admission (Discharged) from 12/28/2018 [facility] Most recent reading at 12/28/2018  3:45 AM  AIMS Total Score  0  1       Assessment and Plan: Patient reported doing well on the current combination medications.  She requested for refills.  1. MDD (major depressive disorder), recurrent, in partial remission (HCC)  - sertraline (ZOLOFT) 100 MG tablet; Take 1 tablet (100 mg total) by mouth daily.  Dispense: 90 tablet; Refill: 0 - traZODone (DESYREL) 50 MG tablet; Take 1 tablet (50 mg total) by mouth at bedtime as needed for sleep.  Dispense: 90 tablet; Refill: 0 - lurasidone (LATUDA) 20 MG TABS tablet; Take 1 tablet with dinner  Dispense: 90 tablet; Refill: 0  2. Post traumatic stress disorder (PTSD)  - sertraline (ZOLOFT) 100 MG tablet; Take 1 tablet (100 mg total) by mouth daily.  Dispense: 90 tablet; Refill: 0 - traZODone (DESYREL) 50 MG tablet; Take 1 tablet (50 mg total) by mouth at bedtime as needed for sleep.  Dispense: 90 tablet;  Refill: 0 - hydrOXYzine (ATARAX/VISTARIL) 25 MG tablet; Take 1 tablet (25 mg total) by mouth 3 (three) times daily as needed for anxiety.  Dispense: 270 tablet; Refill: 0  3. Panic disorder  - sertraline (ZOLOFT) 100 MG tablet; Take 1 tablet (100 mg total) by mouth daily.  Dispense: 90 tablet; Refill: 0 - traZODone (DESYREL) 50 MG tablet; Take 1 tablet (50 mg total) by mouth at bedtime as needed for sleep.  Dispense: 90 tablet; Refill: 0 - hydrOXYzine (ATARAX/VISTARIL) 25 MG tablet; Take 1 tablet (25 mg total) by mouth 3 (three) times daily as needed for anxiety.  Dispense: 270 tablet; Refill: 0  Pt was suggested to resume therapy if she wants to in the near future. F/up in 2 months.   Zena Amos, MD 03/23/2019, 3:38 PM

## 2019-03-24 MED FILL — SERTRALINE HCL 100 MG TAB: 100 | 90 days supply | Qty: 90 | Fill #0

## 2019-03-28 MED FILL — HUMALOG 200 UNITS/ML KWIKPE: 200 | 30 days supply | Qty: 24 | Fill #1

## 2019-03-31 ENCOUNTER — Ambulatory Visit (INDEPENDENT_AMBULATORY_CARE_PROVIDER_SITE_OTHER): Payer: 59 | Admitting: Family

## 2019-04-18 MED FILL — LATUDA 20 MG TABLET: 20 | 90 days supply | Qty: 90 | Fill #0

## 2019-04-23 DIAGNOSIS — Z794 Long term (current) use of insulin: Secondary | ICD-10-CM | POA: Diagnosis not present

## 2019-04-23 DIAGNOSIS — E109 Type 1 diabetes mellitus without complications: Secondary | ICD-10-CM | POA: Diagnosis not present

## 2019-04-30 MED FILL — ELINEST-28 TABLET: 0.3-30 | 84 days supply | Qty: 84 | Fill #2

## 2019-05-05 MED FILL — HUMALOG 200 UNITS/ML KWIKPE: 200 | 30 days supply | Qty: 24 | Fill #2

## 2019-05-10 ENCOUNTER — Telehealth (INDEPENDENT_AMBULATORY_CARE_PROVIDER_SITE_OTHER): Payer: 59 | Admitting: Family

## 2019-05-10 ENCOUNTER — Other Ambulatory Visit (INDEPENDENT_AMBULATORY_CARE_PROVIDER_SITE_OTHER): Payer: Self-pay | Admitting: Family

## 2019-05-10 ENCOUNTER — Encounter (INDEPENDENT_AMBULATORY_CARE_PROVIDER_SITE_OTHER): Payer: Self-pay | Admitting: Family

## 2019-05-10 VITALS — Ht 68.0 in | Wt 340.0 lb

## 2019-05-10 DIAGNOSIS — E1065 Type 1 diabetes mellitus with hyperglycemia: Secondary | ICD-10-CM

## 2019-05-10 DIAGNOSIS — Z4681 Encounter for fitting and adjustment of insulin pump: Secondary | ICD-10-CM

## 2019-05-10 DIAGNOSIS — F4320 Adjustment disorder, unspecified: Secondary | ICD-10-CM | POA: Diagnosis not present

## 2019-05-10 DIAGNOSIS — L83 Acanthosis nigricans: Secondary | ICD-10-CM | POA: Diagnosis not present

## 2019-05-10 DIAGNOSIS — R739 Hyperglycemia, unspecified: Secondary | ICD-10-CM | POA: Diagnosis not present

## 2019-05-10 DIAGNOSIS — N926 Irregular menstruation, unspecified: Secondary | ICD-10-CM | POA: Diagnosis not present

## 2019-05-10 DIAGNOSIS — Z68.41 Body mass index (BMI) pediatric, greater than or equal to 95th percentile for age: Secondary | ICD-10-CM

## 2019-05-10 DIAGNOSIS — R635 Abnormal weight gain: Secondary | ICD-10-CM

## 2019-05-10 NOTE — Progress Notes (Signed)
Pediatric Endocrinology Diabetes Consultation Follow-up Visit  Lydia Estrada [birth year] [id number]  Chief Complaint: Follow-up Type 1 Diabetes   Masneri, Wille Celeste, DO   HPI: Lydia Estrada  is a 19 y.o. female presenting for follow-up of Type 1 Diabetes   she attended this visit alone.  1. Lydia Estrada was admitted to the PICU at [facility] on 03/29/17 for DKA and new-onset T1DM.  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  She was given several boluses of NS and started on an insulin infusion. She was then transferred to our PICU where she was also started on iv antibiotics for several draining skin lesions. Skin cultures subsequently grew out MRSA. Lydia Estrada was started on an omnipod dash pump on 09/29/17.   2. Since last visit to [facility] on 12/2018, she has been well.  She was admitted to [facility] on 12/2018 due to SI. She felt that the hospitalization was helpful to her overall. She has been better since. She was started on Latuda, Sertraline, and hydroxyzine.   She reports that she is working about 30 hours per week and in school. She has NOT been taking Metformin consistently. She is using Omnipod insulin pump with Humalog U200 but she only uses for basal (gets about 41 units of U200 for basal per day), she is giving 50 units of Lantus. She gives injections when she eats because otherwise she will run out of insulin in her pod to quickly. Estimates she gives 8-12 units of U200 Humalog per bolus.   She states that her diet has not been very good but she plans to make some changes. She is starting to work with a Systems analyst. She feels most of her blood sugar variability is due to her inconsistency.   Insulin regimen: Humalog U200 Basal Rates 12AM 1.80  6AM  1.95  1230PM 2.15  5PM 1.45              Insulin to Carbohydrate Ratio 12AM 5  3pm 4             Insulin Sensitivity Factor 12AM 55  6am 35  9pm 50          Target Blood Glucose 12AM 80   6AM 80   10PM 80          She continues on metformin XR 1000mg  daily Taking Lantus 60 units   Hypoglycemia: Not really having many lows, able to feel most when awake.  No pattern.  No glucagon needed.  Pump download:  - Unable to download. Omnipod website is down. Reviewed manually.  CGM download: Using dexcom G6     Med-alert ID: Wearing today. Injection/Pump sites: Abdomen, arms, legs, butt, back Annual labs due: 08/2019  Ophthalmology due: Had an appt 05/2017  Weight: No changes in weight today.   Periods: Had secondary amenorrhea after dx of DM.  Had a period 10/2017, and then 01/2018.  Most recent period was awful, extremely heavy, lasted 8 days.  Interested in birth control for sexual activity and to help with menstrual cycles.  She does have a history of migraine headaches with sensitivity to light, improved some with ibuprofen, sometimes requires sleep.  Has more headaches when BGs are low and high, sometimes has spotty vision.  It is difficult to determine whether she has migraines with  aura.  + family history of migraines.     ROS:  All systems reviewed with pertinent positives listed below; otherwise negative. Constitutional: Sleeping well. + weight gain  HEENT: Wears glasses as needed. No blurry vision.  Respiratory: No increased work of breathing currently GI: No constipation or diarrhea GU: periods as above. On OCP.  Musculoskeletal: No joint deformity Neuro: Normal affect. No headaches.  Endocrine: As above  Past Medical History:   Past Medical History:  Diagnosis Date  . Type 1 diabetes mellitus (HCC)    Dx 03/2017, A1c 11%, presented in DKA. GAD antibodies markedly positive at 1493 (<5)    Medications:  Outpatient Encounter Medications as of 05/10/2019  Medication Sig Note  . ELINEST 0.3-30  MG-MCG tablet Take 0.3 mg by mouth at bedtime.   Marland Kitchen glucagon 1 MG injection Follow package directions for low blood sugar. 01/09/2019: Never used  . glucose blood (FREESTYLE LITE) test strip USE TO TEST BLOOD SUGAR 6 TIMES PER DAY.   . hydrOXYzine (ATARAX/VISTARIL) 25 MG tablet Take 1 tablet (25 mg total) by mouth 3 (three) times daily as needed for anxiety.   . insulin glargine (LANTUS) 100 UNIT/ML injection Inject 30 Units into the skin daily.   . Insulin Human (INSULIN PUMP) SOLN Inject 1 each into the skin 4 (four) times daily -  before meals and at bedtime. (Patient taking differently: Inject 1 each into the skin continuous. humalog u-200)   . LANTUS SOLOSTAR 100 UNIT/ML Solostar Pen Use up to 100 units daily   . lurasidone (LATUDA) 20 MG TABS tablet Take 1 tablet with dinner   . metFORMIN (GLUCOPHAGE-XR) 500 MG 24 hr tablet Take 1,500 mg by mouth daily with breakfast.   . sertraline (ZOLOFT) 100 MG tablet Take 1 tablet (100 mg total) by mouth daily.   . traZODone (DESYREL) 50 MG tablet Take 1 tablet (50 mg total) by mouth at bedtime as needed for sleep.   Marland Kitchen ondansetron (ZOFRAN ODT) 4 MG disintegrating tablet Take 1 tablet (4 mg total) by mouth every 8 (eight) hours as needed for nausea or vomiting. (Patient not taking: Reported on 05/10/2019)   . oxyCODONE-acetaminophen (PERCOCET/ROXICET) 5-325 MG tablet Take 1-2 tablets by mouth every 6 (six) hours as needed for severe pain.    No facility-administered encounter medications on file as of 05/10/2019.    Allergies: Allergies  Allergen Reactions  . Grapefruit Concentrate   . Ibuprofen     Throat swelling  . Naproxen   . Other     Seaweed     Allergic to seaweed  Surgical History: No past surgical history on file.  Family History:  Family History  Problem Relation Age of Onset  . Diabetes Maternal Grandmother   . Diabetes Maternal Grandfather    Has 12 siblings; siblings are healthy   Social History: Lives with: mother, father  and siblings (1of 13 children) Was homeschooled, completed high school work and graduated this month.  Is signing up for college courses later today.  Physical Exam:  Vitals:   05/10/19 0912  Weight: (!) 340 lb (154.2 kg)  Height: 5\' 8"  (1.727 m)   Ht 5\' 8"  (1.727 m) Comment: patient stated  Wt (!) 340 lb (154.2 kg) Comment: patient stated  LMP 04/26/2019 (Within Days)   BMI 51.70 kg/m  Body mass index: body mass index is 51.7 kg/m. Blood pressure percentiles are not available for patients who are 18 years or older.  Ht Readings from Last 3 Encounters:  05/10/19 5\' 8"  (1.727 m) (93 %, Z= 1.46)*  01/09/19 5\' 8"  (1.727 m) (93 %, Z= 1.47)*  08/27/18 5' 7.72" (1.72 m) (91 %, Z= 1.36)*   * Growth percentiles are based on CDC (Girls, 2-20 Years) data.   Wt Readings from Last 3 Encounters:  05/10/19 (!) 340 lb (154.2 kg) (>99 %, Z= 2.97)*  01/09/19 (!) 331 lb (150.1 kg) (>99 %, Z= 2.90)*  11/24/18 (!) 334 lb (151.5 kg) (>99 %, Z= 2.89)*   * Growth percentiles are based on CDC (Girls, 2-20 Years) data.   General: Obese female in no acute distress.  Alert and oriented.  Head: Normocephalic, atraumatic.   Eyes:  Pupils equal and round. EOMI.   Sclera white.  No eye drainage.   Ears/Nose/Mouth/Throat: Nares patent, no nasal drainage.  Normal dentition, mucous membranes moist.   Neck: supple, no cervical lymphadenopathy, no thyromegaly Cardiovascular: regular rate, normal S1/S2, no murmurs Respiratory: No increased work of breathing.  Lungs clear to auscultation bilaterally.  No wheezes. Abdomen: soft, nontender, nondistended. Normal bowel sounds.  No appreciable masses  Extremities: warm, well perfused, cap refill < 2 sec.   Musculoskeletal: Normal muscle mass.  Normal strength Skin: warm, dry.  No rash or lesions. Neurologic: alert and oriented, normal speech, no tremor   Labs: Last hemoglobin A1c: 8.2% on 08/2018  Lab Results  Component Value Date   HGBA1C 9.1 (H) 12/28/2018         Assessment/Plan: LAPORSHA GREALISH is a 19 y.o. female with  Uncontrolled T1DM on insulin pump, Lantus and metformin therapy. She is having high variability in blood sugars. She is also using close to 200 units of insulin per day on average. Not consisently taking Metformin. Her insurance is requiring her to switch to [place] for long acting.    1. Uncontrolled diabetes mellitus type 1 without complications (HCC) - Metformin ER 1500 mg. Discussed risk associated with ER metformin but family chose to remain on at this time.  - Reviewed insulin pump and CGM download. Discussed trends and patterns.  - Rotate pump sites to prevent scar tissue.  - bolus 15 minutes prior to eating to limit blood sugar spikes.  - Reviewed carb counting and importance of accurate carb counting.  - Discussed signs and symptoms of hypoglycemia. Always have glucose available.  - POCT glucose and hemoglobin A1c  - Reviewed growth chart.    2. Insulin pump titration - Switch to Tresiba 100 units per day  - Will program minimal basal rate in insulin pump  - All bolusing will be U200 Humalog via insulin pump   3. Weight gain  - Exercise at least 30 minutes of daily exercise per day  - Discussed diet and made suggestions for changes.  - Encouraged follow up with Kat, RD.    4. Irregular periods -Follow up with adolescent medicine.   5. Adjustment Reaction  - Discussed concerns and barriers to care   Follow-up:  3 months.   Medical decision-making:  >35 spent today reviewing the medical chart, counseling the patient/family, and documenting today's visit.   When a patient is on insulin, intensive monitoring of blood glucose levels is necessary to avoid hyperglycemia and hypoglycemia. Severe hyperglycemia/hypoglycemia can lead to hospital admissions and be life threatening.    Hermenia Bers,  FNP-C  Pediatric Specialist  [address] [place]  [place], [address]  Tele: [phone]

## 2019-05-10 NOTE — Patient Instructions (Signed)
-  Always have fast sugar with you in case of low blood sugar (glucose tabs, regular juice or soda, candy) -Always wear your ID that states you have diabetes -Always bring your meter to your visit -Call/Email if you want to review blood sugars   

## 2019-05-11 ENCOUNTER — Other Ambulatory Visit (INDEPENDENT_AMBULATORY_CARE_PROVIDER_SITE_OTHER): Payer: Self-pay | Admitting: Family

## 2019-05-11 ENCOUNTER — Other Ambulatory Visit (INDEPENDENT_AMBULATORY_CARE_PROVIDER_SITE_OTHER): Payer: Self-pay | Admitting: Pediatrics

## 2019-05-11 MED ORDER — INSULIN DEGLUDEC 200 UNIT/ML ~~LOC~~ SOPN: PEN_INJECTOR | SUBCUTANEOUS | 6 refills | Status: DC

## 2019-05-11 MED ORDER — INSULIN LISPRO 200 UNIT/ML ~~LOC~~ SOPN: PEN_INJECTOR | SUBCUTANEOUS | 6 refills | Status: DC

## 2019-05-11 MED FILL — TRESIBA FLEXTOUCH 200 UNITS: 200 | 36 days supply | Qty: 18 | Fill #0

## 2019-05-12 ENCOUNTER — Ambulatory Visit (INDEPENDENT_AMBULATORY_CARE_PROVIDER_SITE_OTHER): Payer: 59 | Admitting: Family

## 2019-05-12 MED FILL — metFORMIN HCL ER 500 MG TB2: 500 | 90 days supply | Qty: 270 | Fill #0

## 2019-05-17 ENCOUNTER — Ambulatory Visit (INDEPENDENT_AMBULATORY_CARE_PROVIDER_SITE_OTHER): Payer: 59 | Admitting: Psychiatry

## 2019-05-17 ENCOUNTER — Encounter: Payer: Self-pay | Admitting: Psychiatry

## 2019-05-17 ENCOUNTER — Other Ambulatory Visit: Payer: Self-pay

## 2019-05-17 DIAGNOSIS — F3341 Major depressive disorder, recurrent, in partial remission: Secondary | ICD-10-CM

## 2019-05-17 DIAGNOSIS — F4310 Post-traumatic stress disorder, unspecified: Secondary | ICD-10-CM

## 2019-05-17 DIAGNOSIS — F410 Panic disorder [episodic paroxysmal anxiety] without agoraphobia: Secondary | ICD-10-CM

## 2019-05-17 DIAGNOSIS — F3342 Major depressive disorder, recurrent, in full remission: Secondary | ICD-10-CM

## 2019-05-17 MED ORDER — HYDROXYZINE HCL 25 MG PO TABS: 25.0000 mg | ORAL_TABLET | Freq: Three times a day (TID) | ORAL | 0 refills | Status: DC | PRN

## 2019-05-17 MED ORDER — LATUDA 20 MG PO TABS: ORAL_TABLET | ORAL | 0 refills | Status: DC

## 2019-05-17 MED ORDER — TRAZODONE HCL 50 MG PO TABS: 50.0000 mg | ORAL_TABLET | Freq: Every evening | ORAL | 0 refills | Status: DC | PRN

## 2019-05-17 MED ORDER — SERTRALINE HCL 100 MG PO TABS: 100.0000 mg | ORAL_TABLET | Freq: Every day | ORAL | 0 refills | Status: DC

## 2019-05-17 NOTE — Progress Notes (Signed)
BH MD OP Progress Note  I connected with  [name] on 05/17/19 by phone and verified that I am speaking with the correct person using two identifiers.   I discussed the limitations of evaluation and management by phone. The patient expressed understanding and agreed to proceed.    05/17/2019 3:10 PM Lydia Estrada  MRN:  [id number]  Chief Complaint: " I am doing well."  HPI: Patient reported that she is continuing to do well on her current medications.  She stated that her mood and anxiety have been stable.  She is sleeping well.  She stated that she does not need the hydroxyzine more than once a day now for anxiety.  She feels overall things are progressing well for her.  She complained that she has not heard back regarding therapy appointment in the last couple of months and she was hoping to restart monthly sessions with therapy. Writer informed her that somebody from the office would contact her regarding an appointment for therapy.  Visit Diagnosis:    ICD-10-CM   1. MDD (major depressive disorder), recurrent, in full remission (HCC)  F33.42   2. Post traumatic stress disorder (PTSD)  F43.10   3. Panic disorder  F41.0     Past Psychiatric History: Depression, anxiety, PTSD  Past Medical History:  Past Medical History:  Diagnosis Date  . Type 1 diabetes mellitus (HCC)    Dx 03/2017, A1c 11%, presented in DKA. 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    Family History:  Family History  Problem Relation Age of Onset  . Diabetes Maternal Grandmother   . Diabetes Maternal Grandfather     Social History:  Social History   Socioeconomic History  . Marital status: Single    Spouse name: Not on file  . Number of children: Not on file  . Years of education: Not on file  . Highest education level: Not on file  Occupational History  . Not on file  Tobacco Use  . Smoking status: Never Smoker  . Smokeless tobacco: Never Used   Substance and Sexual Activity  . Alcohol use: No  . Drug use: No  . Sexual activity: Not on file  Other Topics Concern  . Not on file  Social History Narrative   Lives with 2 sisters, 1 brother, mom and dad.    She is Graduating from home schooling in December and will start College in the spring semester (she is considering [employer])    Social Determinants of Corporate investment banker Strain:   . Difficulty of Paying Living Expenses:   Food Insecurity:   . Worried About Programme researcher, broadcasting/film/video in the Last Year:   . Barista in the Last Year:   Transportation Needs:   . Freight forwarder (Medical):   Marland Kitchen Lack of Transportation (Non-Medical):   Physical Activity:   . Days of Exercise per Week:   . Minutes of Exercise per Session:   Stress:   . Feeling of Stress :   Social Connections:   . Frequency of Communication with Friends and Family:   . Frequency of Social Gatherings with Friends and Family:   . Attends Religious Services:   . Active Member of Clubs or Organizations:   . Attends Banker Meetings:   Marland Kitchen Marital Status:     Allergies:  Allergies  Allergen Reactions  . Grapefruit Concentrate   . Ibuprofen  Throat swelling  . Naproxen   . Other     Seaweed     Metabolic Disorder Labs: Lab Results  Component Value Date   HGBA1C 9.1 (H) 12/28/2018   MPG 214.47 12/28/2018   MPG 269 03/29/2017   No results found for: PROLACTIN Lab Results  Component Value Date   CHOL 157 09/10/2018   TRIG 159 (H) 09/10/2018   HDL 51 09/10/2018   CHOLHDL 3.1 09/10/2018   LDLCALC 80 09/10/2018   Lab Results  Component Value Date   TSH 2.791 12/28/2018   TSH 0.96 09/10/2018    Therapeutic Level Labs: No results found for: LITHIUM No results found for: VALPROATE No components found for:  CBMZ  Current Medications: Current Outpatient Medications  Medication Sig Dispense Refill  . ELINEST 0.3-30 MG-MCG tablet Take 0.3 mg by mouth at bedtime.    Marland Kitchen  glucagon 1 MG injection Follow package directions for low blood sugar. 2 each 6  . glucose blood (FREESTYLE LITE) test strip USE TO TEST BLOOD SUGAR 6 TIMES PER DAY. 200 strip 5  . hydrOXYzine (ATARAX/VISTARIL) 25 MG tablet Take 1 tablet (25 mg total) by mouth 3 (three) times daily as needed for anxiety. 270 tablet 0  . insulin degludec (TRESIBA) 200 UNIT/ML FlexTouch Pen Inject 100 units per day as subQ 6 pen 6  . Insulin Human (INSULIN PUMP) SOLN Inject 1 each into the skin 4 (four) times daily -  before meals and at bedtime. (Patient taking differently: Inject 1 each into the skin continuous. humalog u-200)    . insulin lispro (HUMALOG) 200 UNIT/ML KwikPen UP TO 100 UNITS PER DAY 10 pen 6  . lurasidone (LATUDA) 20 MG TABS tablet Take 1 tablet with dinner 90 tablet 0  . metFORMIN (GLUCOPHAGE-XR) 500 MG 24 hr tablet Take 1500 mg at breakfast 270 tablet 1  . ondansetron (ZOFRAN ODT) 4 MG disintegrating tablet Take 1 tablet (4 mg total) by mouth every 8 (eight) hours as needed for nausea or vomiting. (Patient not taking: Reported on 05/10/2019) 10 tablet 0  . oxyCODONE-acetaminophen (PERCOCET/ROXICET) 5-325 MG tablet Take 1-2 tablets by mouth every 6 (six) hours as needed for severe pain. 10 tablet 0  . sertraline (ZOLOFT) 100 MG tablet Take 1 tablet (100 mg total) by mouth daily. 90 tablet 0  . traZODone (DESYREL) 50 MG tablet Take 1 tablet (50 mg total) by mouth at bedtime as needed for sleep. 90 tablet 0   No current facility-administered medications for this visit.      Psychiatric Specialty Exam: Review of Systems  Last menstrual period 04/26/2019.There is no height or weight on file to calculate BMI.  General Appearance: unable to assess due to phone visit  Eye Contact:  unable to assess due to phone visit  Speech:  Clear and Coherent and Normal Rate  Volume:  Normal  Mood:  Euthymic  Affect:  Congruent  Thought Process:  Goal Directed, Linear and Descriptions of Associations: Intact   Orientation:  Full (Time, Place, and Person)  Thought Content: Logical   Suicidal Thoughts:  No  Homicidal Thoughts:  No  Memory:  Recent;   Good Remote;   Good  Judgement:  Fair  Insight:  Fair  Psychomotor Activity:  Normal  Concentration:  Concentration: Good and Attention Span: Good  Recall:  Good  Fund of Knowledge: Good  Language: Good  Akathisia:  Negative  Handed:  Right  AIMS (if indicated): not done  Assets:  Communication Skills Desire for  Improvement Financial Resources/Insurance Housing  ADL's:  Intact  Cognition: WNL  Sleep:  Good with help of Trazodone   Screenings: AIMS     Admission (Discharged) from 12/28/2018 in [facility] Most recent reading at 01/04/2019 10:28 AM Admission (Discharged) from 12/28/2018 in [facility] Most recent reading at 12/28/2018  3:45 AM  AIMS Total Score  0  1       Assessment and Plan: Patient reported doing well on the current combination medications.  She requested for refills.  1. MDD (major depressive disorder), recurrent, in partial remission (HCC)  - sertraline (ZOLOFT) 100 MG tablet; Take 1 tablet (100 mg total) by mouth daily.  Dispense: 90 tablet; Refill: 0 - traZODone (DESYREL) 50 MG tablet; Take 1 tablet (50 mg total) by mouth at bedtime as needed for sleep.  Dispense: 90 tablet; Refill: 0 - lurasidone (LATUDA) 20 MG TABS tablet; Take 1 tablet with dinner  Dispense: 90 tablet; Refill: 0  2. Post traumatic stress disorder (PTSD)  - sertraline (ZOLOFT) 100 MG tablet; Take 1 tablet (100 mg total) by mouth daily.  Dispense: 90 tablet; Refill: 0 - traZODone (DESYREL) 50 MG tablet; Take 1 tablet (50 mg total) by mouth at bedtime as needed for sleep.  Dispense: 90 tablet; Refill: 0 - hydrOXYzine (ATARAX/VISTARIL) 25 MG tablet; Take 1 tablet (25 mg total) by mouth 3 (three) times daily as needed for anxiety.  Dispense: 270 tablet; Refill: 0  3. Panic disorder  -  sertraline (ZOLOFT) 100 MG tablet; Take 1 tablet (100 mg total) by mouth daily.  Dispense: 90 tablet; Refill: 0 - traZODone (DESYREL) 50 MG tablet; Take 1 tablet (50 mg total) by mouth at bedtime as needed for sleep.  Dispense: 90 tablet; Refill: 0 - hydrOXYzine (ATARAX/VISTARIL) 25 MG tablet; Take 1 tablet (25 mg total) by mouth 3 (three) times daily as needed for anxiety.  Dispense: 270 tablet; Refill: 0  Will follow up regarding therapy appointment. F/up in 2 months.   [place] Crane, MD 05/17/2019, 3:10 PM

## 2019-05-21 DIAGNOSIS — E109 Type 1 diabetes mellitus without complications: Secondary | ICD-10-CM | POA: Diagnosis not present

## 2019-05-21 DIAGNOSIS — Z794 Long term (current) use of insulin: Secondary | ICD-10-CM | POA: Diagnosis not present

## 2019-05-26 ENCOUNTER — Telehealth (HOSPITAL_COMMUNITY): Payer: Self-pay | Admitting: Licensed Clinical Social Worker

## 2019-06-02 DIAGNOSIS — E1065 Type 1 diabetes mellitus with hyperglycemia: Secondary | ICD-10-CM | POA: Diagnosis not present

## 2019-06-02 DIAGNOSIS — E10649 Type 1 diabetes mellitus with hypoglycemia without coma: Secondary | ICD-10-CM | POA: Diagnosis not present

## 2019-06-14 MED FILL — TRESIBA FLEXTOUCH 200 UNITS: 200 | 36 days supply | Qty: 18 | Fill #1

## 2019-06-14 MED FILL — HUMALOG 200 UNITS/ML KWIKPE: 200 | 30 days supply | Qty: 24 | Fill #3

## 2019-06-22 MED FILL — traZODone HCL 50 MG TABS: 50 | 90 days supply | Qty: 90 | Fill #0

## 2019-06-27 DIAGNOSIS — Z794 Long term (current) use of insulin: Secondary | ICD-10-CM | POA: Diagnosis not present

## 2019-06-27 DIAGNOSIS — E109 Type 1 diabetes mellitus without complications: Secondary | ICD-10-CM | POA: Diagnosis not present

## 2019-07-11 ENCOUNTER — Telehealth: Payer: Self-pay

## 2019-07-11 NOTE — Telephone Encounter (Signed)
pt called she is completely out of the sertraline

## 2019-07-11 NOTE — Telephone Encounter (Signed)
Dr.Kaur sent her Zoloft on 05/17/2019- 90 days supply. She should not be completely out of it yet.  We will have Shanda Bumps CMA contact patient or the pharmacy to verify.

## 2019-07-12 ENCOUNTER — Encounter: Payer: Self-pay | Admitting: Psychiatry

## 2019-07-12 ENCOUNTER — Other Ambulatory Visit: Payer: Self-pay

## 2019-07-12 ENCOUNTER — Telehealth (INDEPENDENT_AMBULATORY_CARE_PROVIDER_SITE_OTHER): Payer: 59 | Admitting: Psychiatry

## 2019-07-12 DIAGNOSIS — F410 Panic disorder [episodic paroxysmal anxiety] without agoraphobia: Secondary | ICD-10-CM

## 2019-07-12 DIAGNOSIS — F3342 Major depressive disorder, recurrent, in full remission: Secondary | ICD-10-CM | POA: Diagnosis not present

## 2019-07-12 DIAGNOSIS — F4310 Post-traumatic stress disorder, unspecified: Secondary | ICD-10-CM | POA: Diagnosis not present

## 2019-07-12 MED ORDER — LATUDA 20 MG PO TABS: ORAL_TABLET | ORAL | 0 refills | Status: DC

## 2019-07-12 MED ORDER — SERTRALINE HCL 100 MG PO TABS: 100.0000 mg | ORAL_TABLET | Freq: Every day | ORAL | 0 refills | Status: DC

## 2019-07-12 MED ORDER — HYDROXYZINE HCL 25 MG PO TABS: 25.0000 mg | ORAL_TABLET | Freq: Three times a day (TID) | ORAL | 0 refills | Status: DC | PRN

## 2019-07-12 MED ORDER — TRAZODONE HCL 50 MG PO TABS: 50.0000 mg | ORAL_TABLET | Freq: Every evening | ORAL | 0 refills | Status: DC | PRN

## 2019-07-12 NOTE — Progress Notes (Signed)
[facility] MD OP Progress Note  I connected with  EDWARDINE DESCHEPPER on 07/12/19 by a video enabled telemedicine application and verified that I am speaking with the correct person using two identifiers.   I discussed the limitations of evaluation and management by telemedicine. The patient expressed understanding and agreed to proceed.    07/12/2019 2:53 PM BO ROGUE  MRN:  [id number]  Chief Complaint: " I am doing well."  HPI: Patient reported that she is doing well. She notes that her depressive symptoms have been stableon Zoloft and [place]. She notes that she periodically uses hydroxyzine for anxiety. Trazodone has been effective in managing her sleep. No medication adjustments requested at this time. Patient is agreeable to continue current medication regimen. No other concerns noted at this time.  She informed that she has been busy with her classes and work so has not had the chance to schedule appointment with therapist but plans to do so soon.   Visit Diagnosis:    ICD-10-CM   1. MDD (major depressive disorder), recurrent, in full remission ([place])  F33.42 sertraline (ZOLOFT) 100 MG tablet    lurasidone (LATUDA) 20 MG TABS tablet    traZODone (DESYREL) 50 MG tablet  2. Post traumatic stress disorder (PTSD)  F43.10 sertraline (ZOLOFT) 100 MG tablet    hydrOXYzine (ATARAX/VISTARIL) 25 MG tablet    traZODone (DESYREL) 50 MG tablet  3. Panic disorder  F41.0 sertraline (ZOLOFT) 100 MG tablet    hydrOXYzine (ATARAX/VISTARIL) 25 MG tablet    traZODone (DESYREL) 50 MG tablet    Past Psychiatric History: Depression, anxiety, PTSD  Past Medical History:  Past Medical History:  Diagnosis Date  . Type 1 diabetes mellitus ([place])    Dx 03/2017, A1c 11%, presented in DKA. GAD antibodies markedly positive at 1493 (<5)   History reviewed. No pertinent surgical history.    Family History:  Family History  Problem Relation Age of Onset  . Diabetes Maternal Grandmother   . Diabetes Maternal  Grandfather     Social History:  Social History   Socioeconomic History  . Marital status: Single    Spouse name: Not on file  . Number of children: Not on file  . Years of education: Not on file  . Highest education level: Not on file  Occupational History  . Not on file  Tobacco Use  . Smoking status: Never Smoker  . Smokeless tobacco: Never Used  Substance and Sexual Activity  . Alcohol use: No  . Drug use: No  . Sexual activity: Not on file  Other Topics Concern  . Not on file  Social History Narrative   Lives with 2 sisters, 1 brother, mom and dad.    She is Graduating from home schooling in December and will start College in the spring semester (she is considering [employer])    Social Determinants of Radio broadcast assistant Strain:   . Difficulty of Paying Living Expenses:   Food Insecurity:   . Worried About Charity fundraiser in the Last Year:   . Arboriculturist in the Last Year:   Transportation Needs:   . Film/video editor (Medical):   Marland Kitchen Lack of Transportation (Non-Medical):   Physical Activity:   . Days of Exercise per Week:   . Minutes of Exercise per Session:   Stress:   . Feeling of Stress :   Social Connections:   . Frequency of Communication with Friends and Family:   . Frequency  of Social Gatherings with Friends and Family:   . Attends Religious Services:   . Active Member of Clubs or Organizations:   . Attends Banker Meetings:   Marland Kitchen Marital Status:     Allergies:  Allergies  Allergen Reactions  . Grapefruit Concentrate   . Ibuprofen     Throat swelling  . Naproxen   . Other     Seaweed     Metabolic Disorder Labs: Lab Results  Component Value Date   HGBA1C 9.1 (H) 12/28/2018   MPG 214.47 12/28/2018   MPG 269 03/29/2017   No results found for: PROLACTIN Lab Results  Component Value Date   CHOL 157 09/10/2018   TRIG 159 (H) 09/10/2018   HDL 51 09/10/2018   CHOLHDL 3.1 09/10/2018   LDLCALC 80 09/10/2018    Lab Results  Component Value Date   TSH 2.791 12/28/2018   TSH 0.96 09/10/2018    Therapeutic Level Labs: No results found for: LITHIUM No results found for: VALPROATE No components found for:  CBMZ  Current Medications: Current Outpatient Medications  Medication Sig Dispense Refill  . ELINEST 0.3-30 MG-MCG tablet Take 0.3 mg by mouth at bedtime.    Marland Kitchen glucagon 1 MG injection Follow package directions for low blood sugar. 2 each 6  . glucose blood (FREESTYLE LITE) test strip USE TO TEST BLOOD SUGAR 6 TIMES PER DAY. 200 strip 5  . hydrOXYzine (ATARAX/VISTARIL) 25 MG tablet Take 1 tablet (25 mg total) by mouth 3 (three) times daily as needed for anxiety. 270 tablet 0  . insulin degludec (TRESIBA) 200 UNIT/ML FlexTouch Pen Inject 100 units per day as subQ 6 pen 6  . Insulin Human (INSULIN PUMP) SOLN Inject 1 each into the skin 4 (four) times daily -  before meals and at bedtime. (Patient taking differently: Inject 1 each into the skin continuous. humalog u-200)    . insulin lispro (HUMALOG) 200 UNIT/ML KwikPen UP TO 100 UNITS PER DAY 10 pen 6  . lurasidone (LATUDA) 20 MG TABS tablet Take 1 tablet with dinner 90 tablet 0  . metFORMIN (GLUCOPHAGE-XR) 500 MG 24 hr tablet Take 1500 mg at breakfast 270 tablet 1  . ondansetron (ZOFRAN ODT) 4 MG disintegrating tablet Take 1 tablet (4 mg total) by mouth every 8 (eight) hours as needed for nausea or vomiting. (Patient not taking: Reported on 05/10/2019) 10 tablet 0  . oxyCODONE-acetaminophen (PERCOCET/ROXICET) 5-325 MG tablet Take 1-2 tablets by mouth every 6 (six) hours as needed for severe pain. 10 tablet 0  . sertraline (ZOLOFT) 100 MG tablet Take 1 tablet (100 mg total) by mouth daily. 90 tablet 0  . traZODone (DESYREL) 50 MG tablet Take 1 tablet (50 mg total) by mouth at bedtime as needed for sleep. 90 tablet 0   No current facility-administered medications for this visit.      Psychiatric Specialty Exam: Review of Systems  There were no  vitals taken for this visit.There is no height or weight on file to calculate BMI.  General Appearance: Well groomed  Eye Contact:  good  Speech:  Clear and Coherent and Normal Rate  Volume:  Normal  Mood:  Euthymic  Affect:  Congruent  Thought Process:  Goal Directed, Linear and Descriptions of Associations: Intact  Orientation:  Full (Time, Place, and Person)  Thought Content: Logical   Suicidal Thoughts:  No  Homicidal Thoughts:  No  Memory:  Recent;   Good Remote;   Good  Judgement:  Fair  Insight:  Fair  Psychomotor Activity:  Normal  Concentration:  Concentration: Good and Attention Span: Good  Recall:  Good  Fund of Knowledge: Good  Language: Good  Akathisia:  Negative  Handed:  Right  AIMS (if indicated): not done  Assets:  Communication Skills Desire for Improvement Financial Resources/Insurance Housing  ADL's:  Intact  Cognition: WNL  Sleep:  Good with help of Trazodone   Screenings: AIMS     [facility] Most recent reading at 01/04/2019 10:28 AM Admission (Discharged) from 12/28/2018 in [facility] Most recent reading at 12/28/2018  3:45 AM  AIMS Total Score  0  1       Assessment and Plan: Patient reported doing well overall on current medication combination.  She is agreeable to continuing the same regimen.   1. MDD (major depressive disorder), recurrent, in partial remission ([facility])  - sertraline (ZOLOFT) 100 MG tablet; Take 1 tablet (100 mg total) by mouth daily.  Dispense: 90 tablet; Refill: 0 - traZODone (DESYREL) 50 MG tablet; Take 1 tablet (50 mg total) by mouth at bedtime as needed for sleep.  Dispense: 90 tablet; Refill: 0 - lurasidone (LATUDA) 20 MG TABS tablet; Take 1 tablet with dinner  Dispense: 90 tablet; Refill: 0  2. Post traumatic stress disorder (PTSD)  - sertraline (ZOLOFT) 100 MG tablet; Take 1 tablet (100 mg total) by mouth daily.   Dispense: 90 tablet; Refill: 0 - traZODone (DESYREL) 50 MG tablet; Take 1 tablet (50 mg total) by mouth at bedtime as needed for sleep.  Dispense: 90 tablet; Refill: 0 - hydrOXYzine (ATARAX/VISTARIL) 25 MG tablet; Take 1 tablet (25 mg total) by mouth 3 (three) times daily as needed for anxiety.  Dispense: 270 tablet; Refill: 0  3. Panic disorder  - sertraline (ZOLOFT) 100 MG tablet; Take 1 tablet (100 mg total) by mouth daily.  Dispense: 90 tablet; Refill: 0 - traZODone (DESYREL) 50 MG tablet; Take 1 tablet (50 mg total) by mouth at bedtime as needed for sleep.  Dispense: 90 tablet; Refill: 0 - hydrOXYzine (ATARAX/VISTARIL) 25 MG tablet; Take 1 tablet (25 mg total) by mouth 3 (three) times daily as needed for anxiety.  Dispense: 270 tablet; Refill: 0  Pt is planning to schedule her therapy appointment soon. F/up in 2 months.   Zena Amos, MD 07/12/2019, 2:53 PM

## 2019-07-14 ENCOUNTER — Telehealth: Payer: 59 | Admitting: Psychiatry

## 2019-07-19 ENCOUNTER — Ambulatory Visit (INDEPENDENT_AMBULATORY_CARE_PROVIDER_SITE_OTHER): Payer: 59 | Admitting: Licensed Clinical Social Worker

## 2019-07-19 ENCOUNTER — Other Ambulatory Visit: Payer: Self-pay

## 2019-07-19 DIAGNOSIS — F332 Major depressive disorder, recurrent severe without psychotic features: Secondary | ICD-10-CM | POA: Diagnosis not present

## 2019-07-19 DIAGNOSIS — F410 Panic disorder [episodic paroxysmal anxiety] without agoraphobia: Secondary | ICD-10-CM | POA: Diagnosis not present

## 2019-07-19 NOTE — Progress Notes (Signed)
Virtual Visit via Telephone Note  I connected with Lydia Estrada on 07/19/19 at  1:00 PM EDT by telephone and verified that I am speaking with the correct person using two identifiers.   I discussed the limitations, risks, security and privacy concerns of performing an evaluation and management service by telephone and the availability of in person appointments. I also discussed with the patient that there may be a patient responsible charge related to this service. The patient expressed understanding and agreed to proceed.  I discussed the assessment and treatment plan with the patient. The patient was provided an opportunity to ask questions and all were answered. The patient agreed with the plan and demonstrated an understanding of the instructions.   The patient was advised to call back or seek an in-person evaluation if the symptoms worsen or if the condition fails to improve as anticipated.  I provided 45 minutes of non-face-to-face time during this encounter.   Olegario Messier, LCSW    THERAPIST PROGRESS NOTE  Session Time: 1:05pm-1:50pm  Participation Level: Active  Behavioral Response: NAAlertIrritable  Type of Therapy: Individual Therapy  Treatment Goals addressed: Coping  Interventions: CBT, Motivational Interviewing and Supportive  Summary: Lydia Estrada is a 19 y.o. female who presents with major depressive disorder and PTSD. Client reports passive SI and cutting up to 3-4 times weekly after work due to stress. Client verbalized worry about changes in medication due to the possiblity of being seen as weak by family or med seeking by primary psychiatrist. Client identifies stressors as scheduling community service and alcohol assessment classes as well as work and payment for court costs from DUI in June 2020. Client denies alochol use since incident. Client reports THC use only 2-3 times monthly with friends however being accused of use more often by mother which she  finds upsetting. Client shared current conflict with mother and overall response to conflict due to fear of consequences. Client believes moving out and her boyfriend moving away from his parents, who she feels put her down, will be somewhat helpful for improving mood.   Suicidal/Homicidal: Nowithout intent/plan  Therapist Response: Clinician met with client via phone. Clinician assessed for SI/HI/psychosis and overall level of functioning. Clinician processed with client level of comfort and concerns when addressing conflict rather than avoidance, specifically with boyfriends family and mother. Clinician and client discussed utilizing assertive communication in the work place and the effect of letting uncomfortable emotions build up. Clinician and client reviewed mindfulness skills to use in place of cutting.  Plan: Return again in 2 weeks.  Diagnosis: Axis I: MDD, PTSD, Panice D/O       Olegario Messier, LCSW 07/19/2019

## 2019-07-20 MED FILL — TRESIBA FLEXTOUCH 200 UNITS: 200 | 36 days supply | Qty: 18 | Fill #2

## 2019-07-27 DIAGNOSIS — E109 Type 1 diabetes mellitus without complications: Secondary | ICD-10-CM | POA: Diagnosis not present

## 2019-07-27 DIAGNOSIS — Z794 Long term (current) use of insulin: Secondary | ICD-10-CM | POA: Diagnosis not present

## 2019-08-17 DIAGNOSIS — N760 Acute vaginitis: Secondary | ICD-10-CM | POA: Diagnosis not present

## 2019-08-17 DIAGNOSIS — Z113 Encounter for screening for infections with a predominantly sexual mode of transmission: Secondary | ICD-10-CM | POA: Diagnosis not present

## 2019-08-17 DIAGNOSIS — A749 Chlamydial infection, unspecified: Secondary | ICD-10-CM | POA: Diagnosis not present

## 2019-08-17 MED FILL — FLUCONAZOLE 150 MG TABS: 150 | 7 days supply | Qty: 1 | Fill #0

## 2019-08-17 MED FILL — TERCONAZOLE 0.8% VAGINAL CR: 0.8 | 3 days supply | Qty: 20 | Fill #0

## 2019-08-24 MED FILL — TRESIBA FLEXTOUCH 200 UNITS: 200 | 36 days supply | Qty: 18 | Fill #3

## 2019-08-24 MED FILL — HUMALOG 200 UNITS/ML KWIKPE: 200 | 30 days supply | Qty: 24 | Fill #5

## 2019-08-27 DIAGNOSIS — Z794 Long term (current) use of insulin: Secondary | ICD-10-CM | POA: Diagnosis not present

## 2019-08-27 DIAGNOSIS — E109 Type 1 diabetes mellitus without complications: Secondary | ICD-10-CM | POA: Diagnosis not present

## 2019-08-30 DIAGNOSIS — E1065 Type 1 diabetes mellitus with hyperglycemia: Secondary | ICD-10-CM | POA: Diagnosis not present

## 2019-08-30 DIAGNOSIS — E10649 Type 1 diabetes mellitus with hypoglycemia without coma: Secondary | ICD-10-CM | POA: Diagnosis not present

## 2019-09-12 ENCOUNTER — Telehealth (INDEPENDENT_AMBULATORY_CARE_PROVIDER_SITE_OTHER): Payer: 59 | Admitting: Psychiatry

## 2019-09-12 ENCOUNTER — Encounter (HOSPITAL_COMMUNITY): Payer: Self-pay | Admitting: Psychiatry

## 2019-09-12 ENCOUNTER — Other Ambulatory Visit: Payer: Self-pay

## 2019-09-12 ENCOUNTER — Telehealth: Payer: 59 | Admitting: Psychiatry

## 2019-09-12 DIAGNOSIS — F3342 Major depressive disorder, recurrent, in full remission: Secondary | ICD-10-CM | POA: Diagnosis not present

## 2019-09-12 DIAGNOSIS — F4310 Post-traumatic stress disorder, unspecified: Secondary | ICD-10-CM

## 2019-09-12 DIAGNOSIS — F410 Panic disorder [episodic paroxysmal anxiety] without agoraphobia: Secondary | ICD-10-CM | POA: Diagnosis not present

## 2019-09-12 MED ORDER — HYDROXYZINE HCL 25 MG PO TABS: 25.0000 mg | ORAL_TABLET | Freq: Three times a day (TID) | ORAL | 0 refills | Status: DC | PRN

## 2019-09-12 MED ORDER — TRAZODONE HCL 100 MG PO TABS: 100.0000 mg | ORAL_TABLET | Freq: Every day | ORAL | 0 refills | Status: DC

## 2019-09-12 MED ORDER — SERTRALINE HCL 100 MG PO TABS: 100.0000 mg | ORAL_TABLET | Freq: Every day | ORAL | 0 refills | Status: DC

## 2019-09-12 NOTE — Progress Notes (Signed)
[facility] MD OP Progress Note  Virtual Visit via Telephone Note  I connected with Lydia Estrada on 09/12/19 at  4:00 PM EDT by telephone and verified that I am speaking with the correct person using two identifiers.  Location: Patient: home Provider: Clinic   I discussed the limitations, risks, security and privacy concerns of performing an evaluation and management service by telephone and the availability of in person appointments. I also discussed with the patient that there may be a patient responsible charge related to this service. The patient expressed understanding and agreed to proceed.   I provided 15 minutes of non-face-to-face time during this encounter.     09/12/2019 4:06 PM MADELENE KAATZ  MRN:  [id number]  Chief Complaint: " I am doing fine."  HPI: Patient is going well for her.  She stated that she is starting a new job at [employer].  She has not decided she wants to go back to college at present.  She informed that she started taking 2 tablets of trazodone a few weeks ago as 1 tablet was not helpful with sleep.  2 tablets have been helpful with sleeping.  She reported that overall things are progressing well for her in her personal life and denied any acute issues or concerns at this time.  Visit Diagnosis:    ICD-10-CM   1. MDD (major depressive disorder), recurrent, in full remission (H[facility])  F33.42   2. Post traumatic stress disorder (PTSD)  F43.10   3. Panic disorder  F41.0     Past Psychiatric History: Depression, anxiety, PTSD  Past Medical History:  Past Medical History:  Diagnosis Date  . Type 1 diabetes mellitus ([facility])    Dx 03/2017, A1c 11%, presented in DKA. 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    Family History:  Family History  Problem Relation Age of Onset  . Diabetes Maternal Grandmother   . Diabetes Maternal Grandfather     Social History:  Social History   Socioeconomic History  .  Marital status: Single    Spouse name: Not on file  . Number of children: Not on file  . Years of education: Not on file  . Highest education level: Not on file  Occupational History  . Not on file  Tobacco Use  . Smoking status: Never Smoker  . Smokeless tobacco: Never Used  Substance and Sexual Activity  . Alcohol use: No  . Drug use: No  . Sexual activity: Not on file  Other Topics Concern  . Not on file  Social History Narrative   Lives with 2 sisters, 1 brother, mom and dad.    She is Graduating from home schooling in December and will start College in the spring semester (she is considering [employer])    Social Determinants of Corporate investment banker Strain:   . Difficulty of Paying Living Expenses:   Food Insecurity:   . Worried About Programme researcher, broadcasting/film/video in the Last Year:   . Barista in the Last Year:   Transportation Needs:   . Freight forwarder (Medical):   Marland Kitchen Lack of Transportation (Non-Medical):   Physical Activity:   . Days of Exercise per Week:   . Minutes of Exercise per Session:   Stress:   . Feeling of Stress :   Social Connections:   . Frequency of Communication with Friends and Family:   . Frequency of Social  Gatherings with Friends and Family:   . Attends Religious Services:   . Active Member of Clubs or Organizations:   . Attends Banker Meetings:   Marland Kitchen Marital Status:     Allergies:  Allergies  Allergen Reactions  . Grapefruit Concentrate   . Ibuprofen     Throat swelling  . Naproxen   . Other     Seaweed     Metabolic Disorder Labs: Lab Results  Component Value Date   HGBA1C 9.1 (H) 12/28/2018   MPG 214.47 12/28/2018   MPG 269 03/29/2017   No results found for: PROLACTIN Lab Results  Component Value Date   CHOL 157 09/10/2018   TRIG 159 (H) 09/10/2018   HDL 51 09/10/2018   CHOLHDL 3.1 09/10/2018   LDLCALC 80 09/10/2018   Lab Results  Component Value Date   TSH 2.791 12/28/2018   TSH 0.96 09/10/2018     Therapeutic Level Labs: No results found for: LITHIUM No results found for: VALPROATE No components found for:  CBMZ  Current Medications: Current Outpatient Medications  Medication Sig Dispense Refill  . ELINEST 0.3-30 MG-MCG tablet Take 0.3 mg by mouth at bedtime.    Marland Kitchen glucagon 1 MG injection Follow package directions for low blood sugar. 2 each 6  . glucose blood (FREESTYLE LITE) test strip USE TO TEST BLOOD SUGAR 6 TIMES PER DAY. 200 strip 5  . hydrOXYzine (ATARAX/VISTARIL) 25 MG tablet Take 1 tablet (25 mg total) by mouth 3 (three) times daily as needed for anxiety. 270 tablet 0  . insulin degludec (TRESIBA) 200 UNIT/ML FlexTouch Pen Inject 100 units per day as subQ 6 pen 6  . Insulin Human (INSULIN PUMP) SOLN Inject 1 each into the skin 4 (four) times daily -  before meals and at bedtime. (Patient taking differently: Inject 1 each into the skin continuous. humalog u-200)    . insulin lispro (HUMALOG) 200 UNIT/ML KwikPen UP TO 100 UNITS PER DAY 10 pen 6  . lurasidone (LATUDA) 20 MG TABS tablet Take 1 tablet with dinner 90 tablet 0  . metFORMIN (GLUCOPHAGE-XR) 500 MG 24 hr tablet Take 1500 mg at breakfast 270 tablet 1  . ondansetron (ZOFRAN ODT) 4 MG disintegrating tablet Take 1 tablet (4 mg total) by mouth every 8 (eight) hours as needed for nausea or vomiting. (Patient not taking: Reported on 05/10/2019) 10 tablet 0  . oxyCODONE-acetaminophen (PERCOCET/ROXICET) 5-325 MG tablet Take 1-2 tablets by mouth every 6 (six) hours as needed for severe pain. 10 tablet 0  . sertraline (ZOLOFT) 100 MG tablet Take 1 tablet (100 mg total) by mouth daily. 90 tablet 0  . traZODone (DESYREL) 50 MG tablet Take 1 tablet (50 mg total) by mouth at bedtime as needed for sleep. 90 tablet 0   No current facility-administered medications for this visit.      Psychiatric Specialty Exam: Review of Systems  There were no vitals taken for this visit.There is no height or weight on file to calculate BMI.   General Appearance: Well groomed  Eye Contact:  good  Speech:  Clear and Coherent and Normal Rate  Volume:  Normal  Mood:  Euthymic  Affect:  Congruent  Thought Process:  Goal Directed, Linear and Descriptions of Associations: Intact  Orientation:  Full (Time, Place, and Person)  Thought Content: Logical   Suicidal Thoughts:  No  Homicidal Thoughts:  No  Memory:  Recent;   Good Remote;   Good  Judgement:  Fair  Insight:  Fair  Psychomotor Activity:  Normal  Concentration:  Concentration: Good and Attention Span: Good  Recall:  Good  Fund of Knowledge: Good  Language: Good  Akathisia:  Negative  Handed:  Right  AIMS (if indicated): not done  Assets:  Communication Skills Desire for Improvement Financial Resources/Insurance Housing  ADL's:  Intact  Cognition: WNL  Sleep:  Good with help of Trazodone   Screenings: AIMS     [facility] Most recent reading at 01/04/2019 10:28 AM Admission (Discharged) from 12/28/2018 in [facility] Most recent reading at 12/28/2018  3:45 AM  AIMS Total Score 0 1       Assessment and Plan: Patient reported doing well overall on current medication combination.  She is agreeable to continuing the same regimen.   1. MDD (major depressive disorder), recurrent, in partial remission (HCC)  - sertraline (ZOLOFT) 100 MG tablet; Take 1 tablet (100 mg total) by mouth daily.  Dispense: 90 tablet; Refill: 0 - Increase traZODone (DESYREL) 100 MG tablet; Take 1 tablet (100 mg total) by mouth at bedtime as needed for sleep.  Dispense: 90 tablet; Refill: 0 - lurasidone (LATUDA) 20 MG TABS tablet; Take 1 tablet with dinner  Dispense: 90 tablet; Refill: 0  2. Post traumatic stress disorder (PTSD)  - sertraline (ZOLOFT) 100 MG tablet; Take 1 tablet (100 mg total) by mouth daily.  Dispense: 90 tablet; Refill: 0 - hydrOXYzine (ATARAX/VISTARIL) 25 MG tablet;  Take 1 tablet (25 mg total) by mouth 3 (three) times daily as needed for anxiety.  Dispense: 270 tablet; Refill: 0  3. Panic disorder  - sertraline (ZOLOFT) 100 MG tablet; Take 1 tablet (100 mg total) by mouth daily.  Dispense: 90 tablet; Refill: 0 - hydrOXYzine (ATARAX/VISTARIL) 25 MG tablet; Take 1 tablet (25 mg total) by mouth 3 (three) times daily as needed for anxiety.  Dispense: 270 tablet; Refill: 0   F/up in 2 months.   Zena Amos, MD 09/12/2019, 4:06 PM

## 2019-09-19 ENCOUNTER — Other Ambulatory Visit: Payer: Self-pay

## 2019-09-19 ENCOUNTER — Ambulatory Visit (INDEPENDENT_AMBULATORY_CARE_PROVIDER_SITE_OTHER): Payer: 59 | Admitting: Licensed Clinical Social Worker

## 2019-09-19 DIAGNOSIS — F332 Major depressive disorder, recurrent severe without psychotic features: Secondary | ICD-10-CM | POA: Diagnosis not present

## 2019-09-19 DIAGNOSIS — F4310 Post-traumatic stress disorder, unspecified: Secondary | ICD-10-CM

## 2019-09-20 NOTE — Progress Notes (Signed)
Virtual Visit via Telephone Note  I connected with Lydia Estrada on 09/19/2019 at 10:00 AM EDT by telephone and verified that I am speaking with the correct person using two identifiers.   I discussed the limitations, risks, security and privacy concerns of performing an evaluation and management service by telephone and the availability of in person appointments. I also discussed with the patient that there may be a patient responsible charge related to this service. The patient expressed understanding and agreed to proceed.  I discussed the assessment and treatment plan with the patient. The patient was provided an opportunity to ask questions and all were answered. The patient agreed with the plan and demonstrated an understanding of the instructions.   The patient was advised to call back or seek an in-person evaluation if the symptoms worsen or if the condition fails to improve as anticipated.  I provided 45 minutes of non-face-to-face time during this encounter. Client location: home Clinician location: office   Olegario Messier, LCSW    THERAPIST PROGRESS NOTE  Session Time: 10am-10:45am  Participation Level: Active  Behavioral Response: NAAlertAnxious and Depressed  Type of Therapy: Individual Therapy  Treatment Goals addressed: Anxiety, Coping and Diagnosis: major depressive disorder  Interventions: CBT, Motivational Interviewing and Supportive  Summary: Lydia Estrada is a 19 y.o. female who presents with symptoms of depression reported at currently 7/10. Client reporting recent passive SI with no plan to die but does endorse cutting 3 days ago to 'release pain.' Client reports vacillating between being impulsive and indecisive. Client also notes trouble with not being able to focus long before becoming forgetful, leading to feeling angry, frustrated, and depressed. Client processed feelings of not feeling good enough compared to siblings. Client discusses with clinician  feeling the need to be in a relationship as being alone she is more likely to self harm as she does not have anyone to disappoint.   Suicidal/Homicidal: Nowithout intent/plan  Therapist Response: Clinician met with client via telehealth. Clinician assessed for SI/HI/psychosis and overall level of functioning. Clinician inquired about passive SI and cutting frequency. Clinician explored why client did not identify worsening depressive symptoms to psychiatrist and client reported not wanting to seem weak and needing to 'pull it together' like successful siblings. Clinician processed with client thoughts and feelings related to self esteem and sense of self worth based on feedback received from parents and overheard from co-workers about behavior.  Plan: Return again in 1-2 weeks.  Diagnosis: Axis I: Major Depression, Recurrent severe      Olegario Messier, LCSW 09/19/2019

## 2019-09-28 ENCOUNTER — Other Ambulatory Visit (INDEPENDENT_AMBULATORY_CARE_PROVIDER_SITE_OTHER): Payer: Self-pay | Admitting: Family

## 2019-09-28 ENCOUNTER — Encounter (INDEPENDENT_AMBULATORY_CARE_PROVIDER_SITE_OTHER): Payer: Self-pay

## 2019-09-28 MED FILL — traZODone HCL 100 MG TABS: 100 | 90 days supply | Qty: 90 | Fill #0

## 2019-09-28 MED FILL — metFORMIN HCL ER 500 MG TB2: 500 | 90 days supply | Qty: 270 | Fill #1

## 2019-09-28 MED FILL — HUMALOG 200 UNITS/ML KWIKPE: 200 | 84 days supply | Qty: 63 | Fill #0

## 2019-09-28 MED FILL — TERCONAZOLE 0.8% VAGINAL CR: 0.8 | 3 days supply | Qty: 20 | Fill #1

## 2019-09-28 MED FILL — TRESIBA FLEXTOUCH 200 UNITS: 200 | 36 days supply | Qty: 18 | Fill #4

## 2019-09-29 DIAGNOSIS — E109 Type 1 diabetes mellitus without complications: Secondary | ICD-10-CM | POA: Diagnosis not present

## 2019-09-29 DIAGNOSIS — Z794 Long term (current) use of insulin: Secondary | ICD-10-CM | POA: Diagnosis not present

## 2019-10-03 ENCOUNTER — Ambulatory Visit (INDEPENDENT_AMBULATORY_CARE_PROVIDER_SITE_OTHER): Payer: 59 | Admitting: Licensed Clinical Social Worker

## 2019-10-03 ENCOUNTER — Other Ambulatory Visit: Payer: Self-pay

## 2019-10-03 DIAGNOSIS — F4310 Post-traumatic stress disorder, unspecified: Secondary | ICD-10-CM

## 2019-10-03 DIAGNOSIS — F332 Major depressive disorder, recurrent severe without psychotic features: Secondary | ICD-10-CM

## 2019-10-03 NOTE — Progress Notes (Signed)
Virtual Visit via Telephone Note  I connected with Lydia Estrada on 10/03/19 at 10:00 AM EDT by telephone and verified that I am speaking with the correct person using two identifiers.   I discussed the limitations, risks, security and privacy concerns of performing an evaluation and management service by telephone and the availability of in person appointments. I also discussed with the patient that there may be a patient responsible charge related to this service. The patient expressed understanding and agreed to proceed.  I discussed the assessment and treatment plan with the patient. The patient was provided an opportunity to ask questions and all were answered. The patient agreed with the plan and demonstrated an understanding of the instructions.   The patient was advised to call back or seek an in-person evaluation if the symptoms worsen or if the condition fails to improve as anticipated.  I provided 45 minutes of non-face-to-face time during this encounter.  LOCATION: client home, clinician office  Olegario Messier, LCSW    THERAPIST PROGRESS NOTE  Session Time: 10am-10:45am  Participation Level: Active  Behavioral Response: NAAlertEuthymic  Type of Therapy: Individual Therapy  Treatment Goals addressed: Coping and Diagnosis: MDD  Interventions: CBT, Motivational Interviewing and Supportive  Summary: Lydia Estrada is a 19 y.o. female who presents with improved mood. Client reports smoking cannabis 1xmonthly. Client reports improved mood over the past few days resulting in assertive communication with potential female partners over the Internet. Client reports losing weight and feeling better about herself physically is also adding to her improved mood. Client reports a decrease in cutting to only 2xwkly with less intensity. Client reports enjoyment with honesty and assertive communication with males 'up front' to avoid 'wasting time' and entering into toxic relationships  similar to what she sees with her siblings. Client notes she has not started using assertive communication with her family members. Client reports quitting her job due to not getting paid on time and is currently looking for another job.    Suicidal/Homicidal: Nowithout intent/plan  Therapist Response: Clinician meets with client via telehealth (client at home, clinician in office) assessing for SI/HI/psychosis and overall level of functioning. Clinician inquired about sleep, appetite, substance use, and SIB. Clinician praises client use of assertive communication and boundaries with males she is talking to on the internet and challenges client to utilizes same skills with family members and psychiatrist to get needs met. Clinician provided psychoeducation on effects of substance use, specifically with cannabis use and the possibility of psychosis increased in those under 60 years of age. Client is receptive to this information.  Plan: Return again in 2-3 weeks.  Diagnosis: Axis I: MDD, PTSD, panic d/o      Olegario Messier, LCSW 10/03/2019

## 2019-10-30 DIAGNOSIS — E109 Type 1 diabetes mellitus without complications: Secondary | ICD-10-CM | POA: Diagnosis not present

## 2019-10-30 DIAGNOSIS — Z794 Long term (current) use of insulin: Secondary | ICD-10-CM | POA: Diagnosis not present

## 2019-10-31 ENCOUNTER — Ambulatory Visit (HOSPITAL_COMMUNITY): Payer: 59 | Admitting: Licensed Clinical Social Worker

## 2019-10-31 ENCOUNTER — Ambulatory Visit (INDEPENDENT_AMBULATORY_CARE_PROVIDER_SITE_OTHER): Payer: 59 | Admitting: Family

## 2019-10-31 ENCOUNTER — Encounter (INDEPENDENT_AMBULATORY_CARE_PROVIDER_SITE_OTHER): Payer: Self-pay | Admitting: Family

## 2019-10-31 ENCOUNTER — Telehealth (HOSPITAL_COMMUNITY): Payer: Self-pay | Admitting: Licensed Clinical Social Worker

## 2019-10-31 ENCOUNTER — Other Ambulatory Visit: Payer: Self-pay

## 2019-10-31 VITALS — BP 122/84 | HR 78 | Wt 326.0 lb

## 2019-10-31 DIAGNOSIS — E1065 Type 1 diabetes mellitus with hyperglycemia: Secondary | ICD-10-CM

## 2019-10-31 DIAGNOSIS — F4320 Adjustment disorder, unspecified: Secondary | ICD-10-CM | POA: Diagnosis not present

## 2019-10-31 DIAGNOSIS — L83 Acanthosis nigricans: Secondary | ICD-10-CM

## 2019-10-31 DIAGNOSIS — R739 Hyperglycemia, unspecified: Secondary | ICD-10-CM | POA: Diagnosis not present

## 2019-10-31 DIAGNOSIS — Z68.41 Body mass index (BMI) pediatric, greater than or equal to 95th percentile for age: Secondary | ICD-10-CM

## 2019-10-31 DIAGNOSIS — Z794 Long term (current) use of insulin: Secondary | ICD-10-CM | POA: Diagnosis not present

## 2019-10-31 LAB — POCT GLUCOSE (DEVICE FOR HOME USE): POC Glucose: 107 mg/dl — AB (ref 70–99)

## 2019-10-31 LAB — POCT GLYCOSYLATED HEMOGLOBIN (HGB A1C): Hemoglobin A1C: 11.7 % — AB (ref 4.0–5.6)

## 2019-10-31 NOTE — Patient Instructions (Addendum)
Hypoglycemia  . Shaking or trembling. . Sweating and chills. . Dizziness or lightheadedness. . Faster heart rate. Marland Kitchen Headaches. . Hunger. . Nausea. . Nervousness or irritability. . Pale skin. Marland Kitchen Restless sleep. . Weakness. Kennis Carina vision. . Confusion or trouble concentrating. . Sleepiness. . Slurred speech. . Tingling or numbness in the face or mouth.  How do I treat an episode of hypoglycemia? The American Diabetes Association recommends the "15-15 rule" for an episode of hypoglycemia: . Eat or drink 15 grams of carbs to raise your blood sugar. . After 15 minutes, check your blood sugar. . If it's still below 70 mg/dL, have another 15 grams of carbs. . Repeat until your blood sugar is at least 70 mg/dL.  Hyperglycemia  . Frequent urination . Increased thirst . Blurred vision . Fatigue . Headache Diabetic Ketoacidosis (DKA)  If hyperglycemia goes untreated, it can cause toxic acids (ketones) to build up in your blood and urine (ketoacidosis). Signs and symptoms include: . Fruity-smelling breath . Nausea and vomiting . Shortness of breath . Dry mouth . Weakness . Confusion . Coma . Abdominal pain        Sick day/Ketones Protocol  . Check blood glucose every 2 hours  . Check urine ketones every 2 hours (until ketones are clear)  . Drink plenty of fluids (water, Pedialyte) hourly . Give rapid acting insulin correction dose every 3 hours until ketones are clear  . Notify clinic of sickness/ketones  . If you develop signs of DKA, go to ER immediately.   Hemoglobin A1c levels     For Shots  - Tresiba 130 units  - Humalog 120/30/5 plan   Basal Rates 12AM 1.80  6AM  1.95  1230PM 2.15  5PM 1.45            Insulin to Carbohydrate Ratio 12AM 5  3pm 4             Insulin Sensitivity Factor 12AM 55  6am 35  9pm 50          Target Blood Glucose 12AM 80   6AM 80   10PM 80

## 2019-10-31 NOTE — Progress Notes (Signed)
Pediatric Endocrinology Diabetes Consultation Follow-up Visit  [name] 02-13-2001 [id number]  Chief Complaint: Follow-up Type 1 Diabetes   Masneri, Wille Celeste, DO   HPI: Lydia Estrada  is a 19 y.o. female presenting for follow-up of Type 1 Diabetes   she attended this visit alone.  1. Lydia Estrada was admitted to the PICU at [facility] on 03/29/17 for DKA and new-onset T1DM.  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  She was given several boluses of NS and started on an insulin infusion. She was then transferred to our PICU where she was also started on iv antibiotics for several draining skin lesions. Skin cultures subsequently grew out MRSA. Lydia Estrada was started on an omnipod dash pump on 09/29/17.   2. Since last visit to [facility] on 05/2019 , she has been well.    She is being follow closely by psychiatry and counseling services. She is going to counseling but reports that it is not helping because she is "not being honest". She was diagnosed with Bipolar 1. Taking Sertaline, latuda and hydroxyzine.   She states that " I guess I havent been treating my diabetes how I should be". She reports that she has just given up caring about everything and I dont really care anymore. She is not wearing her Omnipod any longer because it got "frustrating". She is taking Humalog and Tresiba injections. She reports that is not following a plan and is just guessing at doses. She estimates she is giving 15 units of Humalog u200 at meals.   She is considering restarting her insulin pump but would like copies of a Humalog plan she can use until that point. She reports she is motivated to make improvements because she wants to get her license.    Insulin regimen: When using MDI:  Humalog U200 Taking 130 units of  Tresiba.  Metformin 1500 mg   When on pump she takes 100 units of tresiba plus settings below.  Basal Rates 12AM 1.80  6AM  1.95  1230PM 2.15  5PM 1.45            Insulin to Carbohydrate Ratio 12AM 5  3pm 4             Insulin Sensitivity Factor 12AM 55  6am 35  9pm 50          Target Blood Glucose 12AM 80   6AM 80   10PM 80           Hypoglycemia: Not really having many lows, able to feel most when awake.  No pattern.  No glucagon needed. Pump download:  - Unable to download. Omnipod website is down. Reviewed manually.  CGM download: Using dexcom G6   Med-alert ID: Wearing today. Injection/Pump sites: Abdomen, arms, legs, butt, back Annual labs due: ordred  Ophthalmology due: Had an appt 05/2017  Weight: No changes in weight today.   Periods: Had secondary amenorrhea after dx of DM.  Had a period 10/2017, and then 01/2018.  Most recent period was awful, extremely heavy, lasted 8 days.  Interested in birth control for sexual activity and to help with menstrual cycles.  She does have a history of migraine headaches with sensitivity to light, improved some with ibuprofen, sometimes requires sleep.  Has more headaches when BGs are low and high,  sometimes has spotty vision.  It is difficult to determine whether she has migraines with aura.  + family history of migraines.     ROS:  All systems reviewed with pertinent positives listed below; otherwise negative. Constitutional: Sleeping well. 14 lbs weight loss.  HEENT: Wears glasses as needed. No blurry vision.  Respiratory: No increased work of breathing currently Cardiac: no palpitations no tachycardia.  GI: No constipation or diarrhea GU: periods as above. On OCP.  Musculoskeletal: No joint deformity Neuro: Normal affect. No headaches.  Endocrine: As above  Past Medical History:   Past Medical History:  Diagnosis Date  . Type 1 diabetes mellitus (HCC)    Dx 03/2017, A1c 11%, presented in DKA. GAD  antibodies markedly positive at 1493 (<5)    Medications:  Outpatient Encounter Medications as of 10/31/2019  Medication Sig Note  . ELINEST 0.3-30 MG-MCG tablet Take 0.3 mg by mouth at bedtime.   Marland Kitchen HUMALOG KWIKPEN 200 UNIT/ML KwikPen INJECT 150 UNITS INTO THE SKIN DAILY.   . hydrOXYzine (ATARAX/VISTARIL) 25 MG tablet Take 1 tablet (25 mg total) by mouth 3 (three) times daily as needed for anxiety.   . insulin degludec (TRESIBA) 200 UNIT/ML FlexTouch Pen Inject 100 units per day as subQ   . lurasidone (LATUDA) 20 MG TABS tablet Take 1 tablet with dinner   . metFORMIN (GLUCOPHAGE-XR) 500 MG 24 hr tablet Take 1500 mg at breakfast   . sertraline (ZOLOFT) 100 MG tablet Take 1 tablet (100 mg total) by mouth daily.   . traZODone (DESYREL) 100 MG tablet Take 1 tablet (100 mg total) by mouth at bedtime.   Marland Kitchen glucagon 1 MG injection Follow package directions for low blood sugar. 01/09/2019: Never used  . glucose blood (FREESTYLE LITE) test strip USE TO TEST BLOOD SUGAR 6 TIMES PER DAY. (Patient not taking: Reported on 10/31/2019)   . Insulin Human (INSULIN PUMP) SOLN Inject 1 each into the skin 4 (four) times daily -  before meals and at bedtime. (Patient not taking: Reported on 10/31/2019)   . ondansetron (ZOFRAN ODT) 4 MG disintegrating tablet Take 1 tablet (4 mg total) by mouth every 8 (eight) hours as needed for nausea or vomiting. (Patient not taking: Reported on 05/10/2019)   . oxyCODONE-acetaminophen (PERCOCET/ROXICET) 5-325 MG tablet Take 1-2 tablets by mouth every 6 (six) hours as needed for severe pain.    No facility-administered encounter medications on file as of 10/31/2019.    Allergies: Allergies  Allergen Reactions  . Grapefruit Concentrate   . Ibuprofen     Throat swelling  . Naproxen   . Other     Seaweed     Allergic to seaweed  Surgical History: No past surgical history on file.  Family History:  Family History  Problem Relation Age of Onset  . Diabetes Maternal  Grandmother   . Diabetes Maternal Grandfather    Has 12 siblings.    Social History: Lives with: mother, father and siblings (1of 13 children) Was homeschooled, completed high school work and graduated this month.  Is signing up for college courses later today.  Physical Exam:  Vitals:   10/31/19 1354  Weight: (!) 326 lb (147.9 kg)   Wt (!) 326 lb (147.9 kg)   BMI 49.57 kg/m  Body mass index: body mass index is 49.57 kg/m. Blood pressure percentiles are not available for patients who are 18 years or older.  Ht Readings from Last 3 Encounters:  05/10/19 5\' 8"  (1.727 m) (93 %, Z= 1.46)*  01/09/19 5\' 8"  (1.727 m) (93 %, Z= 1.47)*  08/27/18 5' 7.72" (1.72 m) (91 %, Z= 1.36)*   * Growth percentiles are based on CDC (Girls, 2-20 Years) data.   Wt Readings from Last 3 Encounters:  10/31/19 (!) 326 lb (147.9 kg) (>99 %, Z= 2.97)*  05/10/19 (!) 340 lb (154.2 kg) (>99 %, Z= 2.97)*  01/09/19 (!) 331 lb (150.1 kg) (>99 %, Z= 2.90)*   * Growth percentiles are based on CDC (Girls, 2-20 Years) data.   General: Obese female in no acute distress.   Head: Normocephalic, atraumatic.   Eyes:  Pupils equal and round. EOMI.   Sclera white.  No eye drainage.   Ears/Nose/Mouth/Throat: Nares patent, no nasal drainage.  Normal dentition, mucous membranes moist.   Neck: supple, no cervical lymphadenopathy, no thyromegaly Cardiovascular: regular rate, normal S1/S2, no murmurs Respiratory: No increased work of breathing.  Lungs clear to auscultation bilaterally.  No wheezes. Abdomen: soft, nontender, nondistended. Normal bowel sounds.  No appreciable masses  Extremities: warm, well perfused, cap refill < 2 sec.   Musculoskeletal: Normal muscle mass.  Normal strength Skin: warm, dry.  No rash or lesions. + acanthosis nigricans.  Neurologic: alert and oriented, normal speech, no tremor   Labs: Last hemoglobin A1c: 9.1% on 12/2018 Lab Results  Component Value Date   HGBA1C 11.7 (A) 10/31/2019        Assessment/Plan: JALEY YAN is a 19 y.o. female with  Uncontrolled T1DM on insulin pump, Tresiba and metformin therapy. She has stopped using insulin pump therapy and has not been giving injections appropriately which is leading to frequent hyperglycemia. Her hemoglobin A1c has increased to 11.7% which is much higher then ADA goal of <7%. Her situation is further complicated by severe insulin resistance currently using U200 humalog. Bipolar disorder negatively impacting diabetes care.    1. Uncontrolled diabetes mellitus type 1 without complications (HCC) - Metformin XR 1500 mg.  - Reviewed insulin pump and CGM download. Discussed trends and patterns.  - Rotate pump sites to prevent scar tissue.  - bolus 15 minutes prior to eating to limit blood sugar spikes.  - Reviewed carb counting and importance of accurate carb counting.  - Discussed signs and symptoms of hypoglycemia. Always have glucose available.  - POCT glucose and hemoglobin A1c  - Reviewed growth chart.  - Discussed using injection when not on pump therapy. Provided copy of insulin plan.   2. Insulin pump titration - When using pump 100 units of tresiba. When not on pump 100 units of tresiba.  - When not using pump use Humalog 120/30/5 plan   3. Obesity  - Reviewed growth chart.  - Advised to exercise at leas 30 minutes per day  - Discussed diet and made suggestions for changes.    4. Irregular periods -Follow up with adolescent medicine.   5. Adjustment Reaction /bipolar disorder  - Discussed concerns and barriers to care  - Encouraged close follow up with psych.   Follow-up:  3 months.   Medical decision-making:  >45 spent today reviewing the medical chart, counseling the patient/family, and documenting today's visit.    When a patient is on insulin, intensive monitoring of blood glucose levels is necessary to avoid hyperglycemia and hypoglycemia. Severe hyperglycemia/hypoglycemia can lead to  hospital admissions and be life threatening.    12,  FNP-C  Pediatric Specialist  [address]  [place], [place]  Tele: [phone]

## 2019-10-31 NOTE — Progress Notes (Signed)
[facility]  [address] [place], [place] [address] Telephone [phone]     Fax [phone]         Rapid-Acting Insulin Instructions (Novolog/Humalog/Apidra) (Target blood sugar 120, Insulin Sensitivity Factor 30, Insulin to Carbohydrate Ratio 1 unit for 5g)   SECTION A (Meals): 1. At mealtimes, take rapid-acting insulin according to this "Two-Component Method".  a. Measure Fingerstick Blood Glucose (or use reading on continuous glucose monitor) 0-15 minutes prior to the meal. Use the "Correction Dose Table" below to determine the dose of rapid-acting insulin needed to bring your blood sugar down to a baseline of 120. You can also calculate this dose with the following equation: (Blood sugar - target blood sugar) divided by 30.  Correction Dose Table Blood Sugar Rapid-acting Insulin units  Blood Sugar Rapid-acting Insulin units  <120 0  361-390 9  121-150 1  391-420 10  151-180 2  421-450 11  181-210 3  451-480 12  211-240 4  481-510 13  241-270 5  511-540 14  271-300 6  541-570 15  301-330 7  571-600 16  331-360 8  >600 or Hi 17   b. Estimate the number of grams of carbohydrates you will be eating (carb count). Use the "Food Dose Table" below to determine the dose of rapid-acting insulin needed to cover the carbs in the meal. You can also calculate this dose using this formula: Total carbs divided by 5.  Food Dose Table Grams of Carbs Rapid-acting Insulin units  Grams of Carbs Rapid-acting Insulin units  1-5 1  41-45 9  6-10 2  46-50 10  11-15 3  51-55 11  16-20 4  56-60 12  21-25 5  61-65 13  26-30 6  66-70 14  31-35 7  71-75 15  36-40 8  >75: Add 1 unit for every additional 5 grams of carbs    c. Add up the Correction Dose plus the Food Dose = "Total Dose" of rapid-acting insulin to be taken. d. If you know the number of carbs you will eat, take the rapid-acting insulin 0-15 minutes prior to the meal; otherwise take the  insulin immediately after the meal.   SECTION B (Bedtime/2AM): 1. Wait at least 2.5-3 hours after taking your supper rapid-acting insulin before you do your bedtime blood sugar test. Based on your blood sugar, take a "bedtime snack" according to the table below. These carbs are "Free". You don't have to cover those carbs with rapid-acting insulin.  If you want a snack with more carbs than the "bedtime snack" table allows, subtract the free carbs from the total amount of carbs in the snack and cover this carb amount with rapid-acting insulin based on the Food Dose Table from Page 1.  Use the following column for your bedtime snack: ___________________  Bedtime Carbohydrate Snack Table   Blood Sugar Large Medium Small Very Small  < 76         60 gms         50 gms         40 gms    30 gms       76-100         50 gms         40 gms         30 gms    20 gms     101-150         40 gms  30 gms         20 gms    10 gms     151-199         30 gms         20gms                       10 gms      0    200-250         20 gms         10 gms           0      0    251-300         10 gms           0           0      0      > 300           0           0                    0      0   2. If the blood sugar at bedtime is above 200, no snack is needed (though if you do want a snack, cover the entire amount of carbs based on the Food Dose Table on page 1). You will need to take additional rapid-acting insulin based on the Bedtime Sliding Scale Dose Table below.  Bedtime Sliding Scale Dose Table Blood Sugar Rapid-acting Insulin units  <200 0  201-230 1  231-260 2  261-290 3  291-320 4  321-350 5  351-380 6  381-410 7  > 410 8   3. Then take your usual dose of long-acting insulin (Lantus, Basaglar, Evaristo Bury).  4. If we ask you to check your blood sugar in the middle of the night (2AM-3AM), you should wait at least 3 hours after your last rapid-acting insulin dose before you check the blood sugar.   You will then use the Bedtime Sliding Scale Dose Table to give additional units of rapid-acting insulin if blood sugar is above 200. This may be especially necessary in times of sickness, when the illness may cause more resistance to insulin and higher blood sugar than usual.  Molli Knock, MD, CDE Signature: _____________________________________ Dessa Phi, MD   Judene Companion, MD    Gretchen Short, NP  Date: ______________

## 2019-11-02 ENCOUNTER — Encounter (HOSPITAL_COMMUNITY): Payer: Self-pay | Admitting: Psychiatry

## 2019-11-02 ENCOUNTER — Other Ambulatory Visit: Payer: Self-pay

## 2019-11-02 ENCOUNTER — Telehealth (INDEPENDENT_AMBULATORY_CARE_PROVIDER_SITE_OTHER): Payer: 59 | Admitting: Psychiatry

## 2019-11-02 DIAGNOSIS — F4310 Post-traumatic stress disorder, unspecified: Secondary | ICD-10-CM | POA: Diagnosis not present

## 2019-11-02 DIAGNOSIS — F900 Attention-deficit hyperactivity disorder, predominantly inattentive type: Secondary | ICD-10-CM | POA: Diagnosis not present

## 2019-11-02 DIAGNOSIS — F410 Panic disorder [episodic paroxysmal anxiety] without agoraphobia: Secondary | ICD-10-CM | POA: Diagnosis not present

## 2019-11-02 DIAGNOSIS — F3341 Major depressive disorder, recurrent, in partial remission: Secondary | ICD-10-CM

## 2019-11-02 MED ORDER — SERTRALINE HCL 100 MG PO TABS: 100.0000 mg | ORAL_TABLET | Freq: Every day | ORAL | 0 refills | Status: DC

## 2019-11-02 MED ORDER — AMPHETAMINE-DEXTROAMPHET ER 10 MG PO CP24: 10.0000 mg | ORAL_CAPSULE | Freq: Every morning | ORAL | 0 refills | Status: DC

## 2019-11-02 MED ORDER — TRAZODONE HCL 100 MG PO TABS: 100.0000 mg | ORAL_TABLET | Freq: Every day | ORAL | 0 refills | Status: DC

## 2019-11-02 MED ORDER — LURASIDONE HCL 40 MG PO TABS: 40.0000 mg | ORAL_TABLET | Freq: Every day | ORAL | 0 refills | Status: DC

## 2019-11-02 MED ORDER — HYDROXYZINE HCL 25 MG PO TABS: 25.0000 mg | ORAL_TABLET | Freq: Three times a day (TID) | ORAL | 0 refills | Status: DC | PRN

## 2019-11-02 MED FILL — TRESIBA FLEXTOUCH 200 UNITS: 200 | 36 days supply | Qty: 18 | Fill #5

## 2019-11-02 MED FILL — LATUDA 40 MG TABLET: 40 | 90 days supply | Qty: 90 | Fill #0

## 2019-11-02 MED FILL — ADDERALL XR 10 MG CAP SA: 10 | 30 days supply | Qty: 30 | Fill #0

## 2019-11-02 MED FILL — SERTRALINE HCL 100 MG TABS: 100 | 90 days supply | Qty: 90 | Fill #0

## 2019-11-02 MED FILL — HYDROXYZINE HCL 25 MG TABS: 25 | 90 days supply | Qty: 270 | Fill #0

## 2019-11-02 NOTE — Progress Notes (Signed)
BH MD OP Progress Note  Virtual Visit via Telephone Note  I connected with Lydia Estrada on 11/02/19 at  3:00 PM EDT by telephone and verified that I am speaking with the correct person using two identifiers.  Location: Patient: home Provider: Clinic   I discussed the limitations, risks, security and privacy concerns of performing an evaluation and management service by telephone and the availability of in person appointments. I also discussed with the patient that there may be a patient responsible charge related to this service. The patient expressed understanding and agreed to proceed.   I provided 18 minutes of non-face-to-face time during this encounter.     11/02/2019 2:21 PM Lydia Estrada  MRN:  [id number]  Chief Complaint: " I have not been doing well."  HPI: Patient reported that she does not feel as good as she would like to be.  She stated that for the past few weeks she has been struggling a lot.  She stated that she cannot seem to be able to keep a job.  She was working at [employer] however she could not keep up with the pace so she quit that job.  She is now working at [employer].  She is still struggling with keeping her tasks on time.  She has hard time staying focused.  She informed that she is always struggled with poor concentration issues since elementary school however her parents did not bring it it up to the pediatrician.  She stated that she also feels moody and irritable at times.  She also informed that her relationships did not last too long either because of her mood swings.  She informed that she is able to sleep well with our trazodone and wants sleep at all if she does not take her trazodone. Patient was asked screening questions for ADHD and she answered yes and very often to most questions asked. Patient was agreeable to trial of stimulant medication to help her concentration issues. She was also recommended that we should go ahead and increase the  dose of Latuda for optimal management of her mood.  Patient was agreeable with this recommendation as well.   Visit Diagnosis:    ICD-10-CM   1. MDD (major depressive disorder), recurrent, in partial remission (HCC)  F33.41   2. Post traumatic stress disorder (PTSD)  F43.10   3. Panic disorder  F41.0     Past Psychiatric History: Depression, anxiety, PTSD  Past Medical History:  Past Medical History:  Diagnosis Date  . Type 1 diabetes mellitus (HCC)    Dx 03/2017, A1c 11%, presented in DKA. 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    Family History:  Family History  Problem Relation Age of Onset  . Diabetes Maternal Grandmother   . Diabetes Maternal Grandfather     Social History:  Social History   Socioeconomic History  . Marital status: Single    Spouse name: Not on file  . Number of children: Not on file  . Years of education: Not on file  . Highest education level: Not on file  Occupational History  . Not on file  Tobacco Use  . Smoking status: Current Every Day Smoker  . Smokeless tobacco: Never Used  Substance and Sexual Activity  . Alcohol use: No  . Drug use: No  . Sexual activity: Not on file  Other Topics Concern  . Not on file  Social History Narrative  Lives with 2 sisters, 1 brother, mom and dad.    She is Graduating from home schooling in December and will start College in the spring semester (she is considering [employer])    Social Determinants of Corporate investment banker Strain:   . Difficulty of Paying Living Expenses: Not on file  Food Insecurity:   . Worried About Programme researcher, broadcasting/film/video in the Last Year: Not on file  . Ran Out of Food in the Last Year: Not on file  Transportation Needs:   . Lack of Transportation (Medical): Not on file  . Lack of Transportation (Non-Medical): Not on file  Physical Activity:   . Days of Exercise per Week: Not on file  . Minutes of Exercise per Session: Not on file   Stress:   . Feeling of Stress : Not on file  Social Connections:   . Frequency of Communication with Friends and Family: Not on file  . Frequency of Social Gatherings with Friends and Family: Not on file  . Attends Religious Services: Not on file  . Active Member of Clubs or Organizations: Not on file  . Attends Banker Meetings: Not on file  . Marital Status: Not on file    Allergies:  Allergies  Allergen Reactions  . Grapefruit Concentrate   . Ibuprofen     Throat swelling  . Naproxen   . Other     Seaweed     Metabolic Disorder Labs: Lab Results  Component Value Date   HGBA1C 11.7 (A) 10/31/2019   MPG 214.47 12/28/2018   MPG 269 03/29/2017   No results found for: PROLACTIN Lab Results  Component Value Date   CHOL 157 09/10/2018   TRIG 159 (H) 09/10/2018   HDL 51 09/10/2018   CHOLHDL 3.1 09/10/2018   LDLCALC 80 09/10/2018   Lab Results  Component Value Date   TSH 2.791 12/28/2018   TSH 0.96 09/10/2018    Therapeutic Level Labs: No results found for: LITHIUM No results found for: VALPROATE No components found for:  CBMZ  Current Medications: Current Outpatient Medications  Medication Sig Dispense Refill  . ELINEST 0.3-30 MG-MCG tablet Take 0.3 mg by mouth at bedtime.    Marland Kitchen glucagon 1 MG injection Follow package directions for low blood sugar. 2 each 6  . HUMALOG KWIKPEN 200 UNIT/ML KwikPen INJECT 150 UNITS INTO THE SKIN DAILY. 60 mL 6  . hydrOXYzine (ATARAX/VISTARIL) 25 MG tablet Take 1 tablet (25 mg total) by mouth 3 (three) times daily as needed for anxiety. 270 tablet 0  . insulin degludec (TRESIBA) 200 UNIT/ML FlexTouch Pen Inject 100 units per day as subQ 6 pen 6  . Insulin Human (INSULIN PUMP) SOLN Inject 1 each into the skin 4 (four) times daily -  before meals and at bedtime. (Patient not taking: Reported on 10/31/2019)    . lurasidone (LATUDA) 20 MG TABS tablet Take 1 tablet with dinner 90 tablet 0  . metFORMIN (GLUCOPHAGE-XR) 500 MG  24 hr tablet Take 1500 mg at breakfast 270 tablet 1  . sertraline (ZOLOFT) 100 MG tablet Take 1 tablet (100 mg total) by mouth daily. 90 tablet 0  . traZODone (DESYREL) 100 MG tablet Take 1 tablet (100 mg total) by mouth at bedtime. 90 tablet 0   No current facility-administered medications for this visit.      Psychiatric Specialty Exam: Review of Systems  There were no vitals taken for this visit.There is no height or weight on file to  calculate BMI.  General Appearance: unable to assess due to phone visit  Eye Contact:  unable to assess due to phone visit  Speech:  Clear and Coherent and Normal Rate  Volume:  Normal  Mood:  Anxious  Affect:  Congruent  Thought Process:  Goal Directed, Linear and Descriptions of Associations: Intact  Orientation:  Full (Time, Place, and Person)  Thought Content: Logical   Suicidal Thoughts:  No  Homicidal Thoughts:  No  Memory:  Recent;   Good Remote;   Good  Judgement:  Fair  Insight:  Fair  Psychomotor Activity:  Normal  Concentration:  Concentration: Good and Attention Span: Good  Recall:  Good  Fund of Knowledge: Good  Language: Good  Akathisia:  Negative  Handed:  Right  AIMS (if indicated): not done  Assets:  Communication Skills Desire for Improvement Financial Resources/Insurance Housing  ADL's:  Intact  Cognition: WNL  Sleep:  Good with help of Trazodone   Screenings: AIMS     [facility] Most recent reading at 01/04/2019 10:28 AM Admission (Discharged) from 12/28/2018 [facility] Most recent reading at 12/28/2018  3:45 AM  AIMS Total Score 0 1       Assessment and Plan: Patient patient has been she meets criteria for ADHD in addition to her current diagnosis.  She is agreeable to a trial of stimulant namely Adderall XR. Potential side effects of medication and risks vs benefits of treatment vs non-treatment were  explained and discussed. All questions were answered. Will increase the dose of Latuda for optimal management of mood.  1. Post traumatic stress disorder (PTSD)  - hydrOXYzine (ATARAX/VISTARIL) 25 MG tablet; Take 1 tablet (25 mg total) by mouth 3 (three) times daily as needed for anxiety.  Dispense: 270 tablet; Refill: 0 - sertraline (ZOLOFT) 100 MG tablet; Take 1 tablet (100 mg total) by mouth daily.  Dispense: 90 tablet; Refill: 0  2. MDD (major depressive disorder), recurrent, in partial remission (HCC)  - traZODone (DESYREL) 100 MG tablet; Take 1 tablet (100 mg total) by mouth at bedtime.  Dispense: 90 tablet; Refill: 0 - sertraline (ZOLOFT) 100 MG tablet; Take 1 tablet (100 mg total) by mouth daily.  Dispense: 90 tablet; Refill: 0 - Increase lurasidone (LATUDA) 40 MG TABS tablet; Take 1 tablet (40 mg total) by mouth daily with supper.  Dispense: 90 tablet; Refill: 0  3. Panic disorder  - hydrOXYzine (ATARAX/VISTARIL) 25 MG tablet; Take 1 tablet (25 mg total) by mouth 3 (three) times daily as needed for anxiety.  Dispense: 270 tablet; Refill: 0 - sertraline (ZOLOFT) 100 MG tablet; Take 1 tablet (100 mg total) by mouth daily.  Dispense: 90 tablet; Refill: 0  4. Attention deficit hyperactivity disorder (ADHD), predominantly inattentive type  - Start amphetamine-dextroamphetamine (ADDERALL XR) 10 MG 24 hr capsule; Take 1 capsule (10 mg total) by mouth in the morning.  Dispense: 30 capsule; Refill: 0 - amphetamine-dextroamphetamine (ADDERALL XR) 10 MG 24 hr capsule; Take 1 capsule (10 mg total) by mouth in the morning.  Dispense: 30 capsule; Refill: 0   F/up in 2 months.   Zena Amos, MD 11/02/2019, 2:21 PM

## 2019-11-24 ENCOUNTER — Other Ambulatory Visit (INDEPENDENT_AMBULATORY_CARE_PROVIDER_SITE_OTHER): Payer: Self-pay | Admitting: Family

## 2019-11-24 ENCOUNTER — Other Ambulatory Visit (INDEPENDENT_AMBULATORY_CARE_PROVIDER_SITE_OTHER): Payer: Self-pay | Admitting: Pediatrics

## 2019-11-24 MED FILL — UNIFINE PENTIPS 32GX5/32: 32G X 4 MM | 90 days supply | Qty: 600 | Fill #0

## 2019-11-25 DIAGNOSIS — E1065 Type 1 diabetes mellitus with hyperglycemia: Secondary | ICD-10-CM | POA: Diagnosis not present

## 2019-11-25 DIAGNOSIS — E10649 Type 1 diabetes mellitus with hypoglycemia without coma: Secondary | ICD-10-CM | POA: Diagnosis not present

## 2019-11-30 DIAGNOSIS — Z794 Long term (current) use of insulin: Secondary | ICD-10-CM | POA: Diagnosis not present

## 2019-11-30 DIAGNOSIS — E109 Type 1 diabetes mellitus without complications: Secondary | ICD-10-CM | POA: Diagnosis not present

## 2019-12-07 MED FILL — TRESIBA FLEXTOUCH 200 UNITS: 200 | 36 days supply | Qty: 18 | Fill #6

## 2019-12-28 ENCOUNTER — Telehealth (INDEPENDENT_AMBULATORY_CARE_PROVIDER_SITE_OTHER): Payer: 59 | Admitting: Psychiatry

## 2019-12-28 ENCOUNTER — Other Ambulatory Visit (HOSPITAL_COMMUNITY): Payer: Self-pay | Admitting: Psychiatry

## 2019-12-28 ENCOUNTER — Encounter (HOSPITAL_COMMUNITY): Payer: Self-pay | Admitting: Psychiatry

## 2019-12-28 ENCOUNTER — Other Ambulatory Visit: Payer: Self-pay

## 2019-12-28 DIAGNOSIS — F4310 Post-traumatic stress disorder, unspecified: Secondary | ICD-10-CM

## 2019-12-28 DIAGNOSIS — F3341 Major depressive disorder, recurrent, in partial remission: Secondary | ICD-10-CM

## 2019-12-28 DIAGNOSIS — F900 Attention-deficit hyperactivity disorder, predominantly inattentive type: Secondary | ICD-10-CM

## 2019-12-28 DIAGNOSIS — F410 Panic disorder [episodic paroxysmal anxiety] without agoraphobia: Secondary | ICD-10-CM | POA: Diagnosis not present

## 2019-12-28 MED ORDER — TRAZODONE HCL 100 MG PO TABS: 100.0000 mg | ORAL_TABLET | Freq: Every day | ORAL | 0 refills | Status: DC

## 2019-12-28 MED ORDER — SERTRALINE HCL 100 MG PO TABS: 100.0000 mg | ORAL_TABLET | Freq: Every day | ORAL | 0 refills | Status: DC

## 2019-12-28 MED ORDER — AMPHETAMINE-DEXTROAMPHET ER 20 MG PO CP24: 20.0000 mg | ORAL_CAPSULE | Freq: Every day | ORAL | 0 refills | Status: DC

## 2019-12-28 MED ORDER — HYDROXYZINE HCL 25 MG PO TABS: 25.0000 mg | ORAL_TABLET | Freq: Three times a day (TID) | ORAL | 0 refills | Status: DC | PRN

## 2019-12-28 MED ORDER — LURASIDONE HCL 40 MG PO TABS: 40.0000 mg | ORAL_TABLET | Freq: Every day | ORAL | 0 refills | Status: DC

## 2019-12-28 MED FILL — traZODone HCL 100 MG TABS: 100 | 90 days supply | Qty: 90 | Fill #0

## 2019-12-28 MED FILL — ADDERALL XR 20 MG CAP SA: 20 | 30 days supply | Qty: 30 | Fill #0

## 2019-12-28 NOTE — Progress Notes (Signed)
BH MD OP Progress Note  Virtual Visit via Telephone Note  I connected with Lydia Estrada on 12/28/19 at  3:00 PM EDT by telephone and verified that I am speaking with the correct person using two identifiers.  Location: Patient: home Provider: Clinic   I discussed the limitations, risks, security and privacy concerns of performing an evaluation and management service by telephone and the availability of in person appointments. I also discussed with the patient that there may be a patient responsible charge related to this service. The patient expressed understanding and agreed to proceed.   I provided 16 minutes of non-face-to-face time during this encounter.     12/28/2019 3:34 PM Lydia Estrada  MRN:  [id number]  Chief Complaint: " I feel I am doing okay."  HPI: Patient reported she feels she is doing okay for now.  She informed that she tried the Adderall XR which seems to help her for a baby 4 to 5 hours but not beyond that.  She stated that she would like to try higher dose.  She also mentioned that she does not take all her medications pretty consistently and misses doses here and there and maybe she should do better in that area. She feels like the current regimen is helpful and she would like to keep things the way they are.  Visit Diagnosis:    ICD-10-CM   1. MDD (major depressive disorder), recurrent, in partial remission (HCC)  F33.41 lurasidone (LATUDA) 40 MG TABS tablet    sertraline (ZOLOFT) 100 MG tablet    traZODone (DESYREL) 100 MG tablet  2. Post traumatic stress disorder (PTSD)  F43.10 hydrOXYzine (ATARAX/VISTARIL) 25 MG tablet    sertraline (ZOLOFT) 100 MG tablet  3. Panic disorder  F41.0 hydrOXYzine (ATARAX/VISTARIL) 25 MG tablet    sertraline (ZOLOFT) 100 MG tablet  4. Attention deficit hyperactivity disorder (ADHD), predominantly inattentive type  F90.0 amphetamine-dextroamphetamine (ADDERALL XR) 20 MG 24 hr capsule    amphetamine-dextroamphetamine  (ADDERALL XR) 20 MG 24 hr capsule    amphetamine-dextroamphetamine (ADDERALL XR) 20 MG 24 hr capsule    Past Psychiatric History: Depression, anxiety, PTSD  Past Medical History:  Past Medical History:  Diagnosis Date   Type 1 diabetes mellitus (HCC)    Dx 03/2017, A1c 11%, presented in DKA. 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    Family History:  Family History  Problem Relation Age of Onset   Diabetes Maternal Grandmother    Diabetes Maternal Grandfather     Social History:  Social History   Socioeconomic History   Marital status: Single    Spouse name: Not on file   Number of children: Not on file   Years of education: Not on file   Highest education level: Not on file  Occupational History   Not on file  Tobacco Use   Smoking status: Current Every Day Smoker   Smokeless tobacco: Never Used  Substance and Sexual Activity   Alcohol use: No   Drug use: No   Sexual activity: Not on file  Other Topics Concern   Not on file  Social History Narrative   Lives with 2 sisters, 1 brother, mom and dad.    She is Graduating from home schooling in December and will start College in the spring semester (she is considering [employer])    Social Determinants of Corporate investment banker Strain:    Difficulty of Paying Living Expenses: Not  on file  Food Insecurity:    Worried About Programme researcher, broadcasting/film/video in the Last Year: Not on file   [employer] of Food in the Last Year: Not on file  Transportation Needs:    Lack of Transportation (Medical): Not on file   Lack of Transportation (Non-Medical): Not on file  Physical Activity:    Days of Exercise per Week: Not on file   Minutes of Exercise per Session: Not on file  Stress:    Feeling of Stress : Not on file  Social Connections:    Frequency of Communication with Friends and Family: Not on file   Frequency of Social Gatherings with Friends and Family: Not on file    Attends Religious Services: Not on file   Active Member of Clubs or Organizations: Not on file   Attends Banker Meetings: Not on file   Marital Status: Not on file    Allergies:  Allergies  Allergen Reactions   Grapefruit Concentrate    Ibuprofen     Throat swelling   Naproxen    Other     Seaweed     Metabolic Disorder Labs: Lab Results  Component Value Date   HGBA1C 11.7 (A) 10/31/2019   MPG 214.47 12/28/2018   MPG 269 03/29/2017   No results found for: PROLACTIN Lab Results  Component Value Date   CHOL 157 09/10/2018   TRIG 159 (H) 09/10/2018   HDL 51 09/10/2018   CHOLHDL 3.1 09/10/2018   LDLCALC 80 09/10/2018   Lab Results  Component Value Date   TSH 2.791 12/28/2018   TSH 0.96 09/10/2018    Therapeutic Level Labs: No results found for: LITHIUM No results found for: VALPROATE No components found for:  CBMZ  Current Medications: Current Outpatient Medications  Medication Sig Dispense Refill   amphetamine-dextroamphetamine (ADDERALL XR) 20 MG 24 hr capsule Take 1 capsule (20 mg total) by mouth daily. 30 capsule 0   [START ON 01/27/2020] amphetamine-dextroamphetamine (ADDERALL XR) 20 MG 24 hr capsule Take 1 capsule (20 mg total) by mouth daily. 30 capsule 0   [START ON 02/24/2020] amphetamine-dextroamphetamine (ADDERALL XR) 20 MG 24 hr capsule Take 1 capsule (20 mg total) by mouth daily. 30 capsule 0   ELINEST 0.3-30 MG-MCG tablet Take 0.3 mg by mouth at bedtime.     glucagon 1 MG injection Follow package directions for low blood sugar. 2 each 6   HUMALOG KWIKPEN 200 UNIT/ML KwikPen INJECT 150 UNITS INTO THE SKIN DAILY. 60 mL 6   hydrOXYzine (ATARAX/VISTARIL) 25 MG tablet Take 1 tablet (25 mg total) by mouth 3 (three) times daily as needed for anxiety. 270 tablet 0   insulin degludec (TRESIBA) 200 UNIT/ML FlexTouch Pen Inject 100 units per day as subQ 6 pen 6   Insulin Human (INSULIN PUMP) SOLN Inject 1 each into the skin 4  (four) times daily -  before meals and at bedtime. (Patient not taking: Reported on 10/31/2019)     Insulin Pen Needle (UNIFINE PENTIPS) 32G X 4 MM MISC Use to inject insulin 6x daily 600 each 1   lurasidone (LATUDA) 40 MG TABS tablet Take 1 tablet (40 mg total) by mouth daily with supper. 90 tablet 0   metFORMIN (GLUCOPHAGE-XR) 500 MG 24 hr tablet Take 1500 mg at breakfast 270 tablet 1   sertraline (ZOLOFT) 100 MG tablet Take 1 tablet (100 mg total) by mouth daily. 90 tablet 0   traZODone (DESYREL) 100 MG tablet Take 1 tablet (100  mg total) by mouth at bedtime. 90 tablet 0   No current facility-administered medications for this visit.      Psychiatric Specialty Exam: Review of Systems  There were no vitals taken for this visit.There is no height or weight on file to calculate BMI.  General Appearance: unable to assess due to phone visit  Eye Contact:  unable to assess due to phone visit  Speech:  Clear and Coherent and Normal Rate  Volume:  Normal  Mood:  Anxious  Affect:  Congruent  Thought Process:  Goal Directed, Linear and Descriptions of Associations: Intact  Orientation:  Full (Time, Place, and Person)  Thought Content: Logical   Suicidal Thoughts:  No  Homicidal Thoughts:  No  Memory:  Recent;   Good Remote;   Good  Judgement:  Fair  Insight:  Fair  Psychomotor Activity:  Normal  Concentration:  Concentration: Good and Attention Span: Good  Recall:  Good  Fund of Knowledge: Good  Language: Good  Akathisia:  Negative  Handed:  Right  AIMS (if indicated): not done  Assets:  Communication Skills Desire for Improvement Financial Resources/Insurance Housing  ADL's:  Intact  Cognition: WNL  Sleep:  Good with help of Trazodone   Screenings: AIMS     [facility] Most recent reading at 01/04/2019 10:28 AM Admission (Discharged) from 12/28/2018 [facility] Most  recent reading at 12/28/2018  3:45 AM  AIMS Total Score 0 1       Assessment and Plan: Patient found Adderall XR to be helpful with her ADHD symptoms however feels like the dose does not last for more than a few hours.  She would like to try higher dose for optimal effect.  She feels her mood and anxiety are well managed on the current regimen.   1. Post traumatic stress disorder (PTSD)  - hydrOXYzine (ATARAX/VISTARIL) 25 MG tablet; Take 1 tablet (25 mg total) by mouth 3 (three) times daily as needed for anxiety.  Dispense: 270 tablet; Refill: 0 - sertraline (ZOLOFT) 100 MG tablet; Take 1 tablet (100 mg total) by mouth daily.  Dispense: 90 tablet; Refill: 0  2. MDD (major depressive disorder), recurrent, in partial remission (HCC)  - traZODone (DESYREL) 100 MG tablet; Take 1 tablet (100 mg total) by mouth at bedtime.  Dispense: 90 tablet; Refill: 0 - sertraline (ZOLOFT) 100 MG tablet; Take 1 tablet (100 mg total) by mouth daily.  Dispense: 90 tablet; Refill: 0 - lurasidone (LATUDA) 40 MG TABS tablet; Take 1 tablet (40 mg total) by mouth daily with supper.  Dispense: 90 tablet; Refill: 0  3. Panic disorder  - hydrOXYzine (ATARAX/VISTARIL) 25 MG tablet; Take 1 tablet (25 mg total) by mouth 3 (three) times daily as needed for anxiety.  Dispense: 270 tablet; Refill: 0 - sertraline (ZOLOFT) 100 MG tablet; Take 1 tablet (100 mg total) by mouth daily.  Dispense: 90 tablet; Refill: 0  4. Attention deficit hyperactivity disorder (ADHD), predominantly inattentive type  - Increase amphetamine-dextroamphetamine (ADDERALL XR) 20 MG 24 hr capsule; Take 1 capsule (20 mg total) by mouth in the morning.  Dispense: 30 capsule; Refill: 0    Continue same medication regimen. Follow up in 3 months.   Zena Amos, MD 12/28/2019, 3:34 PM

## 2019-12-30 DIAGNOSIS — Z794 Long term (current) use of insulin: Secondary | ICD-10-CM | POA: Diagnosis not present

## 2019-12-30 DIAGNOSIS — E109 Type 1 diabetes mellitus without complications: Secondary | ICD-10-CM | POA: Diagnosis not present

## 2020-01-17 ENCOUNTER — Other Ambulatory Visit (INDEPENDENT_AMBULATORY_CARE_PROVIDER_SITE_OTHER): Payer: Self-pay | Admitting: Family

## 2020-01-17 MED FILL — TRESIBA FLEXTOUCH 200 UNITS: 200 | 36 days supply | Qty: 18 | Fill #0

## 2020-01-30 DIAGNOSIS — Z794 Long term (current) use of insulin: Secondary | ICD-10-CM | POA: Diagnosis not present

## 2020-01-30 DIAGNOSIS — E109 Type 1 diabetes mellitus without complications: Secondary | ICD-10-CM | POA: Diagnosis not present

## 2020-01-31 ENCOUNTER — Ambulatory Visit (INDEPENDENT_AMBULATORY_CARE_PROVIDER_SITE_OTHER): Payer: 59 | Admitting: Family

## 2020-01-31 ENCOUNTER — Other Ambulatory Visit: Payer: Self-pay

## 2020-01-31 ENCOUNTER — Encounter (INDEPENDENT_AMBULATORY_CARE_PROVIDER_SITE_OTHER): Payer: Self-pay | Admitting: Family

## 2020-01-31 VITALS — BP 102/60 | HR 78 | Ht 68.0 in | Wt 335.6 lb

## 2020-01-31 DIAGNOSIS — Z794 Long term (current) use of insulin: Secondary | ICD-10-CM | POA: Diagnosis not present

## 2020-01-31 DIAGNOSIS — F4320 Adjustment disorder, unspecified: Secondary | ICD-10-CM | POA: Diagnosis not present

## 2020-01-31 DIAGNOSIS — N926 Irregular menstruation, unspecified: Secondary | ICD-10-CM | POA: Diagnosis not present

## 2020-01-31 DIAGNOSIS — L83 Acanthosis nigricans: Secondary | ICD-10-CM | POA: Diagnosis not present

## 2020-01-31 DIAGNOSIS — Z68.41 Body mass index (BMI) pediatric, greater than or equal to 95th percentile for age: Secondary | ICD-10-CM | POA: Diagnosis not present

## 2020-01-31 DIAGNOSIS — E1065 Type 1 diabetes mellitus with hyperglycemia: Secondary | ICD-10-CM

## 2020-01-31 DIAGNOSIS — R739 Hyperglycemia, unspecified: Secondary | ICD-10-CM

## 2020-01-31 LAB — POCT GLYCOSYLATED HEMOGLOBIN (HGB A1C): Hemoglobin A1C: 9.7 % — AB (ref 4.0–5.6)

## 2020-01-31 LAB — POCT GLUCOSE (DEVICE FOR HOME USE): POC Glucose: 141 mg/dl — AB (ref 70–99)

## 2020-01-31 NOTE — Progress Notes (Signed)
Pediatric Endocrinology Diabetes Consultation Follow-up Visit  Lydia Estrada October 08, 2000 [id number]  Chief Complaint: Follow-up Type 1 Diabetes   Masneri, Wille Celeste, DO   HPI: Lydia Estrada  is a 19 y.o. female presenting for follow-up of Type 1 Diabetes   she attended this visit alone.  1. Lydia Estrada was admitted to the PICU at [facility] on 03/29/17 for DKA and new-onset T1DM.  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  She was given several boluses of NS and started on an insulin infusion. She was then transferred to our PICU where she was also started on iv antibiotics for several draining skin lesions. Skin cultures subsequently grew out MRSA. Lydia Estrada was started on an omnipod dash pump on 09/29/17.   2. Since last visit to [facility] on 10/2019 , she has been well.    Wearing Omnipod and Dexcom consistently. Working harder on improving diabetes control. She feels like she has done better  monitoring blood sugars. She wants to bolus more consistently when she eats. She has been giving injections to cover the food that she eats instead of using her pump. She does state that she carb counts and uses her carb ratio but prefers to give injection. She does not like changing the Omnipod and feels that she would have to change it more often if she bolused from it.   She changed her basal rates on insulin pump about 1-2 months ago. She is giving 1 unit for every 8 grams of carbs currently   Followed by therapy. Was struggling with binging and purging. Has been doing a lot of work on facing "my issues with food". She feels like being back in therapy is very helpful. She is taking Sertraline, latuda and hydroxyzine daily     Insulin regimen: When using MDI:  Humalog U200 120/30/8 plan  Taking 130 units of  Tresiba.  Metformin 1500 mg   Pump: U200 humalog  Tresiba: 115 units   Basal Rates 12am 1.5                      Insulin to Carbohydrate Ratio 12AM 5  3pm 4             Insulin Sensitivity Factor 12AM 55  6am 35  9pm 50          Target Blood Glucose 12AM 80   6AM 80   10PM 80           Hypoglycemia: Not really having many lows, able to feel most when awake.  No pattern.  No glucagon needed. Pump download:  - Unable to download. Omnipod website is down. Reviewed manually.  CGM download: Using dexcom G6   Med-alert ID: Wearing today. Injection/Pump sites: Abdomen, arms, legs, butt, back Annual labs due: ordred  Ophthalmology due: Had an appt 05/2017    Periods: Had secondary amenorrhea after dx of DM.  Had a period 10/2017, and then 01/2018.  Most recent period was awful, extremely heavy, lasted 8 days.  Interested in birth control for sexual activity and to help with menstrual cycles.  She does have a history of migraine headaches with sensitivity to light, improved some with ibuprofen, sometimes requires sleep.  Has more headaches when BGs are low and high, sometimes has spotty vision.  It is  difficult to determine whether she has migraines with aura.  + family history of migraines.     ROS:  All systems reviewed with pertinent positives listed below; otherwise negative. Constitutional: Sleeping well. 9 lbs weight gain  HEENT: Wears glasses as needed. No blurry vision.  Respiratory: No increased work of breathing currently Cardiac: no palpitations no tachycardia.  GI: No constipation or diarrhea GU: periods as above. On OCP.  Musculoskeletal: No joint deformity Neuro: Normal affect. No headaches.  Endocrine: As above  Past Medical History:   Past Medical History:  Diagnosis Date  . Type 1 diabetes mellitus (HCC)    Dx 03/2017, A1c 11%, presented in DKA. GAD antibodies markedly positive at 1493 (<5)    Medications:  Outpatient Encounter  Medications as of 01/31/2020  Medication Sig Note  . amphetamine-dextroamphetamine (ADDERALL XR) 20 MG 24 hr capsule Take 1 capsule (20 mg total) by mouth daily.   Marland Kitchen ELINEST 0.3-30 MG-MCG tablet Take 0.3 mg by mouth at bedtime.   Marland Kitchen HUMALOG KWIKPEN 200 UNIT/ML KwikPen INJECT 150 UNITS INTO THE SKIN DAILY.   . hydrOXYzine (ATARAX/VISTARIL) 25 MG tablet Take 1 tablet (25 mg total) by mouth 3 (three) times daily as needed for anxiety.   . Insulin Human (INSULIN PUMP) SOLN Inject 1 each into the skin 4 (four) times daily -  before meals and at bedtime.   . Insulin Pen Needle (UNIFINE PENTIPS) 32G X 4 MM MISC Use to inject insulin 6x daily   . lurasidone (LATUDA) 40 MG TABS tablet Take 1 tablet (40 mg total) by mouth daily with supper.   . metFORMIN (GLUCOPHAGE-XR) 500 MG 24 hr tablet Take 1500 mg at breakfast   . sertraline (ZOLOFT) 100 MG tablet Take 1 tablet (100 mg total) by mouth daily.   . traZODone (DESYREL) 100 MG tablet Take 1 tablet (100 mg total) by mouth at bedtime.   . TRESIBA FLEXTOUCH 200 UNIT/ML FlexTouch Pen INJECT 100 UNITS UNDER THE SKIN DAILY AS DIRECTED   . amphetamine-dextroamphetamine (ADDERALL XR) 20 MG 24 hr capsule Take 1 capsule (20 mg total) by mouth daily.   Melene Muller ON 02/24/2020] amphetamine-dextroamphetamine (ADDERALL XR) 20 MG 24 hr capsule Take 1 capsule (20 mg total) by mouth daily.   Marland Kitchen glucagon 1 MG injection Follow package directions for low blood sugar. 01/09/2019: Never used   No facility-administered encounter medications on file as of 01/31/2020.    Allergies: Allergies  Allergen Reactions  . Grapefruit Concentrate   . Ibuprofen     Throat swelling  . Naproxen   . Other     Seaweed     Allergic to seaweed  Surgical History: No past surgical history on file.  Family History:  Family History  Problem Relation Age of Onset  . Diabetes Maternal Grandmother   . Diabetes Maternal Grandfather    Has 12 siblings.    Social History: Lives with:  mother, father and siblings (1of 13 children)   Physical Exam:  Vitals:   01/31/20 1325  BP: 102/60  Pulse: 78  Weight: (!) 335 lb 9.6 oz (152.2 kg)  Height: 5\' 8"  (1.727 m)   BP 102/60   Pulse 78   Ht 5\' 8"  (1.727 m)   Wt (!) 335 lb 9.6 oz (152.2 kg)   BMI 51.03 kg/m  Body mass index: body mass index is 51.03 kg/m. Blood pressure percentiles are not available for patients who are 18 years or older.  Ht Readings from Last 3 Encounters:  01/31/20 5\' 8"  (1.727 m) (93 %, Z= 1.45)*  05/10/19 5\' 8"  (1.727 m) (93 %, Z= 1.46)*  01/09/19 5\' 8"  (1.727 m) (93 %, Z= 1.47)*   * Growth percentiles are based on CDC (Girls, 2-20 Years) data.   Wt Readings from Last 3 Encounters:  01/31/20 (!) 335 lb 9.6 oz (152.2 kg) (>99 %, Z= 3.04)*  10/31/19 (!) 326 lb (147.9 kg) (>99 %, Z= 2.97)*  05/10/19 (!) 340 lb (154.2 kg) (>99 %, Z= 2.97)*   * Growth percentiles are based on CDC (Girls, 2-20 Years) data.   General: Obese female in no acute distress.  Head: Normocephalic, atraumatic.   Eyes:  Pupils equal and round. EOMI.   Sclera white.  No eye drainage.   Ears/Nose/Mouth/Throat: Nares patent, no nasal drainage.  Normal dentition, mucous membranes moist.   Neck: supple, no cervical lymphadenopathy, no thyromegaly Cardiovascular: regular rate, normal S1/S2, no murmurs Respiratory: No increased work of breathing.  Lungs clear to auscultation bilaterally.  No wheezes. Abdomen: soft, nontender, nondistended. Normal bowel sounds.  No appreciable masses  Extremities: warm, well perfused, cap refill < 2 sec.   Musculoskeletal: Normal muscle mass.  Normal strength Skin: warm, dry.  No rash or lesions. + acanthosis nigricans  Neurologic: alert and oriented, normal speech, no tremor    Labs: Last hemoglobin A1c: 11.7% on 10/2019  Lab Results  Component Value Date   HGBA1C 9.7 (A) 01/31/2020       Assessment/Plan: Lydia Estrada is a 19 y.o. female with  Uncontrolled T1DM on insulin  pump, Tresiba and metformin therapy. She is giving using Omnipod primarily for basal rate and to calculate how much bolus insulin she needs but is giving rapid acting humalog U200  injections for meal coverage. She is having a pattern of hypoglycemia between 3am-7am. Pattern of post prandial hyperglycemia, needs stronger carb coverage. Hemoglobin A1c has improved from 11.7% at last visit to 9.7% today.   1. Uncontrolled diabetes mellitus type 1 without complications (HCC) - Metformin XR 1500 mg.  - Reviewed insulin pump and CGM download. Discussed trends and patterns.  - Rotate pump sites to prevent scar tissue.  - bolus 15 minutes prior to eating to limit blood sugar spikes.  - Reviewed carb counting and importance of accurate carb counting.  - Discussed signs and symptoms of hypoglycemia. Always have glucose available.  - POCT glucose and hemoglobin A1c  - Reviewed growth chart.    2. Insulin pump titration - Reduce Tresiba to 105 units  - Change carb ratio from 1 unit for 8 grams to 1 unit for 6 grams.   3. Obesity  -Reviewed growth chart  - Encouraged at least 30 minutes of exercise per day  - Discussed diet and made suggestions for changes  4. Irregular periods -Follow up with adolescent medicine.   5. Adjustment Reaction /bipolar disorder  - close follow up with psych.  - Praise given for improvements.   Follow-up:  3 months.   Influenza vaccine ordered. Counseling provided.   Medical decision-making:  >45 spent today reviewing the medical chart, counseling the patient/family, and documenting today's visit.     When a patient is on insulin, intensive monitoring of blood glucose levels is necessary to avoid hyperglycemia and hypoglycemia. Severe hyperglycemia/hypoglycemia can lead to hospital admissions and be life threatening.    02/02/2020,  FNP-C  Pediatric Specialist  [address]  [place], [address]  Tele: [phone]

## 2020-01-31 NOTE — Patient Instructions (Addendum)
Hypoglycemia  . Shaking or trembling. . Sweating and chills. . Dizziness or lightheadedness. . Faster heart rate. Marland Kitchen Headaches. . Hunger. . Nausea. . Nervousness or irritability. . Pale skin. Marland Kitchen Restless sleep. . Weakness. Kennis Carina vision. . Confusion or trouble concentrating. . Sleepiness. . Slurred speech. . Tingling or numbness in the face or mouth.  How do I treat an episode of hypoglycemia? The American Diabetes Association recommends the "15-15 rule" for an episode of hypoglycemia: . Eat or drink 15 grams of carbs to raise your blood sugar. . After 15 minutes, check your blood sugar. . If it's still below 70 mg/dL, have another 15 grams of carbs. . Repeat until your blood sugar is at least 70 mg/dL.  Hyperglycemia  . Frequent urination . Increased thirst . Blurred vision . Fatigue . Headache Diabetic Ketoacidosis (DKA)  If hyperglycemia goes untreated, it can cause toxic acids (ketones) to build up in your blood and urine (ketoacidosis). Signs and symptoms include: . Fruity-smelling breath . Nausea and vomiting . Shortness of breath . Dry mouth . Weakness . Confusion . Coma . Abdominal pain        Sick day/Ketones Protocol  . Check blood glucose every 2 hours  . Check urine ketones every 2 hours (until ketones are clear)  . Drink plenty of fluids (water, Pedialyte) hourly . Give rapid acting insulin correction dose every 3 hours until ketones are clear  . Notify clinic of sickness/ketones  . If you develop signs of DKA, go to ER immediately.   Hemoglobin A1c levels     Reduce tresiba to 105 units  Change carb ratio to 1 unit for 6 grams.

## 2020-02-17 ENCOUNTER — Other Ambulatory Visit (INDEPENDENT_AMBULATORY_CARE_PROVIDER_SITE_OTHER): Payer: Self-pay | Admitting: Family

## 2020-02-17 MED FILL — SERTRALINE HCL 100 MG TABS: 100 | 90 days supply | Qty: 90 | Fill #0

## 2020-02-17 MED FILL — LATUDA 40 MG TABLET: 40 | 90 days supply | Qty: 90 | Fill #0

## 2020-02-17 MED FILL — HYDROXYZINE HCL 25 MG TABS: 25 | 90 days supply | Qty: 270 | Fill #0

## 2020-02-17 MED FILL — UNIFINE PENTIPS 32GX5/32: 32G X 4 MM | 90 days supply | Qty: 600 | Fill #1

## 2020-02-17 MED FILL — TRESIBA FLEXTOUCH 200 UNITS: 200 | 36 days supply | Qty: 18 | Fill #1

## 2020-02-17 MED FILL — HUMALOG 200 UNITS/ML KWIKPE: 200 | 84 days supply | Qty: 63 | Fill #1

## 2020-02-20 ENCOUNTER — Other Ambulatory Visit (INDEPENDENT_AMBULATORY_CARE_PROVIDER_SITE_OTHER): Payer: Self-pay | Admitting: Family

## 2020-02-20 MED FILL — metFORMIN HCL ER 500 MG TB2: 500 | 90 days supply | Qty: 270 | Fill #0

## 2020-02-22 DIAGNOSIS — E10649 Type 1 diabetes mellitus with hypoglycemia without coma: Secondary | ICD-10-CM | POA: Diagnosis not present

## 2020-02-22 DIAGNOSIS — E1065 Type 1 diabetes mellitus with hyperglycemia: Secondary | ICD-10-CM | POA: Diagnosis not present

## 2020-02-29 DIAGNOSIS — E109 Type 1 diabetes mellitus without complications: Secondary | ICD-10-CM | POA: Diagnosis not present

## 2020-02-29 DIAGNOSIS — Z794 Long term (current) use of insulin: Secondary | ICD-10-CM | POA: Diagnosis not present

## 2020-03-08 ENCOUNTER — Other Ambulatory Visit (HOSPITAL_COMMUNITY): Payer: Self-pay | Admitting: Family Medicine

## 2020-03-08 DIAGNOSIS — Z113 Encounter for screening for infections with a predominantly sexual mode of transmission: Secondary | ICD-10-CM | POA: Diagnosis not present

## 2020-03-08 DIAGNOSIS — E1065 Type 1 diabetes mellitus with hyperglycemia: Secondary | ICD-10-CM | POA: Diagnosis not present

## 2020-03-08 DIAGNOSIS — Z794 Long term (current) use of insulin: Secondary | ICD-10-CM | POA: Diagnosis not present

## 2020-03-08 DIAGNOSIS — N912 Amenorrhea, unspecified: Secondary | ICD-10-CM | POA: Diagnosis not present

## 2020-03-08 DIAGNOSIS — F418 Other specified anxiety disorders: Secondary | ICD-10-CM | POA: Diagnosis not present

## 2020-03-08 DIAGNOSIS — R300 Dysuria: Secondary | ICD-10-CM | POA: Diagnosis not present

## 2020-03-08 MED FILL — DOXYCYCLINE HYCLATE 100 MG: 100 | 7 days supply | Qty: 14 | Fill #0

## 2020-03-16 ENCOUNTER — Ambulatory Visit (INDEPENDENT_AMBULATORY_CARE_PROVIDER_SITE_OTHER): Payer: 59 | Admitting: Licensed Clinical Social Worker

## 2020-03-16 ENCOUNTER — Other Ambulatory Visit: Payer: Self-pay

## 2020-03-16 DIAGNOSIS — F900 Attention-deficit hyperactivity disorder, predominantly inattentive type: Secondary | ICD-10-CM

## 2020-03-16 DIAGNOSIS — F3341 Major depressive disorder, recurrent, in partial remission: Secondary | ICD-10-CM | POA: Diagnosis not present

## 2020-03-16 DIAGNOSIS — F410 Panic disorder [episodic paroxysmal anxiety] without agoraphobia: Secondary | ICD-10-CM

## 2020-03-20 ENCOUNTER — Other Ambulatory Visit (HOSPITAL_COMMUNITY): Payer: Self-pay | Admitting: Psychiatry

## 2020-03-20 ENCOUNTER — Other Ambulatory Visit: Payer: Self-pay

## 2020-03-20 ENCOUNTER — Telehealth (INDEPENDENT_AMBULATORY_CARE_PROVIDER_SITE_OTHER): Payer: 59 | Admitting: Psychiatry

## 2020-03-20 ENCOUNTER — Encounter (HOSPITAL_COMMUNITY): Payer: Self-pay | Admitting: Psychiatry

## 2020-03-20 DIAGNOSIS — F900 Attention-deficit hyperactivity disorder, predominantly inattentive type: Secondary | ICD-10-CM | POA: Diagnosis not present

## 2020-03-20 DIAGNOSIS — F410 Panic disorder [episodic paroxysmal anxiety] without agoraphobia: Secondary | ICD-10-CM | POA: Diagnosis not present

## 2020-03-20 DIAGNOSIS — F3341 Major depressive disorder, recurrent, in partial remission: Secondary | ICD-10-CM | POA: Diagnosis not present

## 2020-03-20 DIAGNOSIS — F4310 Post-traumatic stress disorder, unspecified: Secondary | ICD-10-CM

## 2020-03-20 MED ORDER — BUPROPION HCL ER (XL) 150 MG PO TB24: 150.0000 mg | ORAL_TABLET | ORAL | 1 refills | Status: DC

## 2020-03-20 MED ORDER — TRAZODONE HCL 100 MG PO TABS: 100.0000 mg | ORAL_TABLET | Freq: Every day | ORAL | 0 refills | Status: DC

## 2020-03-20 MED ORDER — HYDROXYZINE HCL 25 MG PO TABS: 25.0000 mg | ORAL_TABLET | Freq: Three times a day (TID) | ORAL | 0 refills | Status: DC | PRN

## 2020-03-20 MED ORDER — SERTRALINE HCL 100 MG PO TABS: ORAL_TABLET | ORAL | 1 refills | Status: DC

## 2020-03-20 MED ORDER — AMPHETAMINE-DEXTROAMPHET ER 20 MG PO CP24: 20.0000 mg | ORAL_CAPSULE | Freq: Every day | ORAL | 0 refills | Status: DC

## 2020-03-20 MED FILL — buPROPion HCL ER (XL) 150 M: 150 | 30 days supply | Qty: 30 | Fill #0

## 2020-03-20 MED FILL — ADDERALL XR 20 MG CAP SA: 20 | 30 days supply | Qty: 30 | Fill #0

## 2020-03-20 NOTE — Progress Notes (Signed)
BH MD OP Progress Note  Virtual Visit via Telephone Note  I connected with Lydia Estrada on 03/20/20 at  3:20 PM EST by telephone and verified that I am speaking with the correct person using two identifiers.  Location: Patient: home Provider: Clinic   I discussed the limitations, risks, security and privacy concerns of performing an evaluation and management service by telephone and the availability of in person appointments. I also discussed with the patient that there may be a patient responsible charge related to this service. The patient expressed understanding and agreed to proceed.   I provided 18 minutes of non-face-to-face time during this encounter.     03/20/2020 3:14 PM Lydia Estrada  MRN:  [id number]  Chief Complaint: " I have been feeling quite depressed lately."  HPI:  Pt reported that she has been feeling quite depressed for past few weeks. She has had passive suicidal ideations cross her mind but denied any plans to hurt herself. She stated that she had a good birthday but as she got closer to the holidays she began to feel depressed with no energy levels and anhedonia. She does not want to do anything. She feels she has no motivation.  Pt had contacted the therapist in [place] location last week and had a session of supportive counseling with the therapist.  She stated that initially her meds were quite helpful, but as time passed she felt the meds were not as effective anymore. She was agreeable to the recommendations on increasing her of sertraline and switching Latuda with Wellbutrin to target her depressive symptoms. She could not tell if increase in Adderall XR dose made a difference but stated that could have been dur to her mood not being to great. Writer agreed with her observation. Potential side effects of medication and risks vs benefits of treatment vs non-treatment were explained and discussed. All questions were answered.  Pt was agreeable to the  recommendations.   Visit Diagnosis:    ICD-10-CM   1. MDD (major depressive disorder), recurrent, in partial remission (HCC)  F33.41   2. Post traumatic stress disorder (PTSD)  F43.10   3. Panic disorder  F41.0   4. Attention deficit hyperactivity disorder (ADHD), predominantly inattentive type  F90.0     Past Psychiatric History: Depression, anxiety, PTSD  Past Medical History:  Past Medical History:  Diagnosis Date  . Type 1 diabetes mellitus (HCC)    Dx 03/2017, A1c 11%, presented in DKA. 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    Family History:  Family History  Problem Relation Age of Onset  . Diabetes Maternal Grandmother   . Diabetes Maternal Grandfather     Social History:  Social History   Socioeconomic History  . Marital status: Single    Spouse name: Not on file  . Number of children: Not on file  . Years of education: Not on file  . Highest education level: Not on file  Occupational History  . Not on file  Tobacco Use  . Smoking status: Current Every Day Smoker  . Smokeless tobacco: Never Used  Vaping Use  . Vaping Use: Every day  Substance and Sexual Activity  . Alcohol use: No  . Drug use: No  . Sexual activity: Not on file  Other Topics Concern  . Not on file  Social History Narrative   Lives with 1 sister,  mom and dad.    Graduated from  home schooling. Not in school currently. Plans on going back next year.   Social Determinants of Health   Financial Resource Strain: Not on file  Food Insecurity: Not on file  Transportation Needs: Not on file  Physical Activity: Not on file  Stress: Not on file  Social Connections: Not on file    Allergies:  Allergies  Allergen Reactions  . Grapefruit Concentrate   . Ibuprofen     Throat swelling  . Naproxen   . Other     Seaweed     Metabolic Disorder Labs: Lab Results  Component Value Date   HGBA1C 9.7 (A) 01/31/2020   MPG 214.47 12/28/2018    MPG 269 03/29/2017   No results found for: PROLACTIN Lab Results  Component Value Date   CHOL 157 09/10/2018   TRIG 159 (H) 09/10/2018   HDL 51 09/10/2018   CHOLHDL 3.1 09/10/2018   LDLCALC 80 09/10/2018   Lab Results  Component Value Date   TSH 2.791 12/28/2018   TSH 0.96 09/10/2018    Therapeutic Level Labs: No results found for: LITHIUM No results found for: VALPROATE No components found for:  CBMZ  Current Medications: Current Outpatient Medications  Medication Sig Dispense Refill  . amphetamine-dextroamphetamine (ADDERALL XR) 20 MG 24 hr capsule Take 1 capsule (20 mg total) by mouth daily. 30 capsule 0  . amphetamine-dextroamphetamine (ADDERALL XR) 20 MG 24 hr capsule Take 1 capsule (20 mg total) by mouth daily. 30 capsule 0  . amphetamine-dextroamphetamine (ADDERALL XR) 20 MG 24 hr capsule Take 1 capsule (20 mg total) by mouth daily. 30 capsule 0  . ELINEST 0.3-30 MG-MCG tablet Take 0.3 mg by mouth at bedtime.    Marland Kitchen glucagon 1 MG injection Follow package directions for low blood sugar. 2 each 6  . HUMALOG KWIKPEN 200 UNIT/ML KwikPen INJECT 150 UNITS INTO THE SKIN DAILY. 60 mL 6  . hydrOXYzine (ATARAX/VISTARIL) 25 MG tablet Take 1 tablet (25 mg total) by mouth 3 (three) times daily as needed for anxiety. 270 tablet 0  . Insulin Human (INSULIN PUMP) SOLN Inject 1 each into the skin 4 (four) times daily -  before meals and at bedtime.    . Insulin Pen Needle (UNIFINE PENTIPS) 32G X 4 MM MISC Use to inject insulin 6x daily 600 each 1  . lurasidone (LATUDA) 40 MG TABS tablet Take 1 tablet (40 mg total) by mouth daily with supper. 90 tablet 0  . metFORMIN (GLUCOPHAGE-XR) 500 MG 24 hr tablet TAKE 3 TABLETS BY MOUTH AT BREAKFAST 270 tablet 1  . sertraline (ZOLOFT) 100 MG tablet Take 1 tablet (100 mg total) by mouth daily. 90 tablet 0  . traZODone (DESYREL) 100 MG tablet Take 1 tablet (100 mg total) by mouth at bedtime. 90 tablet 0  . TRESIBA FLEXTOUCH 200 UNIT/ML FlexTouch Pen  INJECT 100 UNITS UNDER THE SKIN DAILY AS DIRECTED 18 mL 6   No current facility-administered medications for this visit.      Psychiatric Specialty Exam: Review of Systems  There were no vitals taken for this visit.There is no height or weight on file to calculate BMI.  General Appearance: unable to assess due to phone visit  Eye Contact:  unable to assess due to phone visit  Speech:  Clear and Coherent and Normal Rate  Volume:  Normal  Mood:  Depressed  Affect:  Congruent  Thought Process:  Goal Directed, Linear and Descriptions of Associations: Intact  Orientation:  Full (Time, Place, and Person)  Thought Content: Logical   Suicidal Thoughts:  No, pt denied any active plans or intent  Homicidal Thoughts:  No  Memory:  Recent;   Good Remote;   Good  Judgement:  Fair  Insight:  Fair  Psychomotor Activity:  Normal  Concentration:  Concentration: Good and Attention Span: Good  Recall:  Good  Fund of Knowledge: Good  Language: Good  Akathisia:  Negative  Handed:  Right  AIMS (if indicated): not done  Assets:  Communication Skills Desire for Improvement Financial Resources/Insurance Housing  ADL's:  Intact  Cognition: WNL  Sleep:  Good with help of Trazodone   Screenings: AIMS   Flowsheet Row [facility] Most recent reading at 01/04/2019 10:28 AM Admission (Discharged) from [facility]28/[facility] Most recent reading at 12/28/2018  3:45 AM  AIMS Total Score 0 1       Assessment and Plan: Pt is endorsing ongoing depressive symptoms in the contact of non-specific stressors. She is agreeable to adjusting the dose of Sertraline to 150 mg for optimal effect and discontinuing Latuda due to lack of efficacy. Will add Wellbutrin XL to her regimen to target depressive symptoms.  1. MDD (major depressive disorder), recurrent, in partial remission (HCC)  - Increase Sertraline  150 mg daily. - Add Wellbutrin XL 150 mg qam. - traZODone (DESYREL) 100 MG tablet; Take 1 tablet (100 mg total) by mouth at bedtime.  Dispense: 90 tablet; Refill: 0 - Discontinue Latuda due to lack of efficacy.  2. Post traumatic stress disorder (PTSD)  - hydrOXYzine (ATARAX/VISTARIL) 25 MG tablet; Take 1 tablet (25 mg total) by mouth 3 (three) times daily as needed for anxiety.  Dispense: 270 tablet; Refill: 0  3. Panic disorder  - hydrOXYzine (ATARAX/VISTARIL) 25 MG tablet; Take 1 tablet (25 mg total) by mouth 3 (three) times daily as needed for anxiety.  Dispense: 270 tablet; Refill: 0  4. Attention deficit hyperactivity disorder (ADHD), predominantly inattentive type - amphetamine-dextroamphetamine (ADDERALL XR) 20 MG 24 hr capsule; Take 1 capsule (20 mg total) by mouth daily.  Dispense: 30 capsule; Refill: 0  F/up in 4 weeks for close monitoring.  Zena Amos, MD 03/20/2020, 3:14 PM

## 2020-03-21 NOTE — Progress Notes (Signed)
   THERAPIST PROGRESS NOTE  Session Time: 9am-10pm  Participation Level: Active  Behavioral Response: CasualAlertAnxious, Depressed and Irritable  Type of Therapy: Individual Therapy  Treatment Goals addressed: Coping  Interventions: CBT and Supportive  Summary: Lydia Estrada is a 20 y.o. female who presents with major depressive disorder. Client identified stressors with family, recent break up, and health concerns. Clinician and client processed break up and clinician validated client feelings. Client shared employment having positive effect on decreasing ruminating thoughts. Client endorse daily passive SI and weekly SI with plan. Client denied intent at this time.Client completed PHQ9 and GAD 7 with high scores which will be shared with psychiatrist for upcoming appointment. Client verbalized being hesitant to share severity of depressive symptoms with psychiatrist but agreed clinician may share.   Suicidal/Homicidal: Yeswithout intent/plan  Therapist Response: Clinician met with client in person, assessed for SI/HI/psychosis and overall level of functioning. Clinician reminded client of previous psycho-education related to substance use and mental health. Clinician provided summarizing and reflective statements on client's currently identified stressors. Clinician challenged client distorted thinking. Clinician provided STOPP skill to address ruminating, distorted thinking. Clinician provided client homework to address negative self talk with neutral, fact not feeling identification.  Plan: Return again in 2 weeks; client reports will depend on work, will call to re-schedule  Diagnosis: Axis I: Major Depression, Recurrent severe        Olegario Messier, LCSW 03/16/2020

## 2020-04-02 ENCOUNTER — Other Ambulatory Visit (HOSPITAL_COMMUNITY): Payer: Self-pay | Admitting: Dentistry

## 2020-04-02 MED FILL — TRESIBA FLEXTOUCH 200 UNITS: 200 | 36 days supply | Qty: 18 | Fill #2

## 2020-04-02 MED FILL — AMOXICILLIN 500 MG CAPSULE: 500 | 3 days supply | Qty: 21 | Fill #0

## 2020-04-02 MED FILL — traZODone HCL 100 MG TABS: 100 | 90 days supply | Qty: 90 | Fill #0

## 2020-04-03 ENCOUNTER — Other Ambulatory Visit (HOSPITAL_COMMUNITY): Payer: Self-pay | Admitting: Endodontics

## 2020-04-03 MED FILL — HYDROCODON-APAP 5-325: 5-325 | 3 days supply | Qty: 15 | Fill #0

## 2020-04-17 ENCOUNTER — Other Ambulatory Visit: Payer: Self-pay

## 2020-04-17 ENCOUNTER — Telehealth (HOSPITAL_COMMUNITY): Payer: 59 | Admitting: Psychiatry

## 2020-04-21 MED FILL — buPROPion HCL ER (XL) 150 M: 150 | 30 days supply | Qty: 30 | Fill #1

## 2020-05-02 ENCOUNTER — Ambulatory Visit (INDEPENDENT_AMBULATORY_CARE_PROVIDER_SITE_OTHER): Payer: 59 | Admitting: Family

## 2020-05-02 ENCOUNTER — Encounter (INDEPENDENT_AMBULATORY_CARE_PROVIDER_SITE_OTHER): Payer: Self-pay | Admitting: Family

## 2020-05-02 ENCOUNTER — Other Ambulatory Visit (HOSPITAL_COMMUNITY): Payer: Self-pay

## 2020-05-02 ENCOUNTER — Other Ambulatory Visit: Payer: Self-pay

## 2020-05-02 VITALS — BP 122/80 | HR 88 | Ht 67.99 in | Wt 327.8 lb

## 2020-05-02 DIAGNOSIS — L83 Acanthosis nigricans: Secondary | ICD-10-CM | POA: Diagnosis not present

## 2020-05-02 DIAGNOSIS — N926 Irregular menstruation, unspecified: Secondary | ICD-10-CM | POA: Diagnosis not present

## 2020-05-02 DIAGNOSIS — R894 Abnormal immunological findings in specimens from other organs, systems and tissues: Secondary | ICD-10-CM | POA: Diagnosis not present

## 2020-05-02 DIAGNOSIS — R739 Hyperglycemia, unspecified: Secondary | ICD-10-CM

## 2020-05-02 DIAGNOSIS — Z3009 Encounter for other general counseling and advice on contraception: Secondary | ICD-10-CM | POA: Diagnosis not present

## 2020-05-02 DIAGNOSIS — Z716 Tobacco abuse counseling: Secondary | ICD-10-CM | POA: Diagnosis not present

## 2020-05-02 DIAGNOSIS — E1065 Type 1 diabetes mellitus with hyperglycemia: Secondary | ICD-10-CM | POA: Diagnosis not present

## 2020-05-02 DIAGNOSIS — A599 Trichomoniasis, unspecified: Secondary | ICD-10-CM | POA: Diagnosis not present

## 2020-05-02 DIAGNOSIS — Z68.41 Body mass index (BMI) pediatric, greater than or equal to 95th percentile for age: Secondary | ICD-10-CM

## 2020-05-02 LAB — POCT GLYCOSYLATED HEMOGLOBIN (HGB A1C): Hemoglobin A1C: 8.6 % — AB (ref 4.0–5.6)

## 2020-05-02 LAB — POCT GLUCOSE (DEVICE FOR HOME USE): POC Glucose: 141 mg/dl — AB (ref 70–99)

## 2020-05-02 MED FILL — valACYclovir HCL 1 GM TABS: 1 | 10 days supply | Qty: 10 | Fill #0

## 2020-05-02 MED FILL — NICOTINE 21 MG/24HR PATCH: 21 | 28 days supply | Qty: 28 | Fill #0

## 2020-05-02 MED FILL — NICOTINE 4 MG CHEWING GUM: 4 | 16 days supply | Qty: 110 | Fill #0

## 2020-05-02 MED FILL — METRONIDAZOLE 500 MG TABS: 500 | 7 days supply | Qty: 14 | Fill #0

## 2020-05-02 NOTE — Patient Instructions (Signed)

## 2020-05-02 NOTE — Progress Notes (Signed)
Pediatric Endocrinology Diabetes Consultation Follow-up Visit  Lydia Estrada Aug 01, 2000 [id number]  Chief Complaint: Follow-up Type 1 Diabetes   Masneri, Wille Celeste, DO   HPI: Lydia Estrada  is a 20 y.o. female presenting for follow-up of Type 1 Diabetes   she attended this visit alone.  1. Lydia Estrada was admitted to the PICU at [facility] on 03/29/17 for DKA and new-onset T1DM.  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  She was given several boluses of NS and started on an insulin infusion. She was then transferred to our PICU where she was also started on iv antibiotics for several draining skin lesions. Skin cultures subsequently grew out MRSA. Lydia Estrada was started on an omnipod dash pump on 09/29/17.   2. Since last visit to [facility] on 01/2020 , she has been well.    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 She is also trying to stop smoking, she has started a 3 step program using nicotine patches and counseling.   She reports that she is doing well with her diabetes care overall but her depression is making it harder to handle. She is using Omnipod and Dexcom CGM. She ran out of sensors about 1 month ago but is having issues with her insurance. She is using fingerstick blood sugars before meals and bedtime until she gets new dexcom sensor but did not bring her meter today  She switches between bolusing with her pump and giving injections for meal boluses. She estimates she uses about 40 units of Humalog U200 via injections for carbs/blood sugars.    She is going to either psychiatry or psychology weekly. She is currently taking Sertraline, Latuda and Hydroxyzine. Wellbutrin was added about 1 month ago.     Insulin regimen: When using MDI:  Humalog U200 120/30/8  plan  Taking 130 units of Tresiba.  Metformin 1500 mg   When using Pump: U200 humalog  Tresiba: 100 units   Basal Rates 12am 1.5                      Insulin to Carbohydrate Ratio 12AM 5  3pm 4             Insulin Sensitivity Factor 12AM 55  6am 35  9pm 50          Target Blood Glucose 12AM 80   6AM 80   10PM 80           Hypoglycemia: Not really having many lows, able to feel most when awake.  No pattern.  No glucagon needed. Pump download:  - using 35 units per day   - 18% bolus and 82% basal   - entering 65 grams of carbs today.  CGM download: Using dexcom G6 - not wearing for past 1 month.  Med-alert ID: Wearing today. Injection/Pump sites: Abdomen, arms, legs, butt, back Annual labs due: ordred. Reminded today.  Ophthalmology due: Had an appt 05/2017    Periods: Had secondary amenorrhea after dx of DM.  Had a period 10/2017, and then 01/2018.  Most recent period was awful, extremely heavy, lasted 8 days.  Interested in birth control for sexual activity and to help with menstrual cycles.  She does have a history of migraine headaches with sensitivity to  light, improved some with ibuprofen, sometimes requires sleep.  Has more headaches when BGs are low and high, sometimes has spotty vision.  It is difficult to determine whether she has migraines with aura.  + family history of migraines.     ROS:  All systems reviewed with pertinent positives listed below; otherwise negative. Constitutional: Sleeping well. 8 lbs weight loss.  HEENT: Wears glasses as needed. No blurry vision.  Respiratory: No increased work of breathing currently Cardiac: no palpitations no tachycardia.  GI: No constipation or diarrhea GU: periods as above. On OCP.  Musculoskeletal: No joint deformity Neuro: Normal affect. No headaches.  Endocrine: As above  Past Medical History:   Past Medical History:  Diagnosis Date  . Herpes    type 1 and 2  . Type 1 diabetes mellitus (HCC)     Dx 03/2017, A1c 11%, presented in DKA. GAD antibodies markedly positive at 1493 (<5)    Medications:  Outpatient Encounter Medications as of 05/02/2020  Medication Sig Note  . amphetamine-dextroamphetamine (ADDERALL XR) 20 MG 24 hr capsule Take 1 capsule (20 mg total) by mouth daily.   Marland Kitchen buPROPion (WELLBUTRIN XL) 150 MG 24 hr tablet Take 1 tablet (150 mg total) by mouth every morning.   Marland Kitchen HUMALOG KWIKPEN 200 UNIT/ML KwikPen INJECT 150 UNITS INTO THE SKIN DAILY.   . hydrOXYzine (ATARAX/VISTARIL) 25 MG tablet Take 1 tablet (25 mg total) by mouth 3 (three) times daily as needed for anxiety.   . Insulin Human (INSULIN PUMP) SOLN Inject 1 each into the skin 4 (four) times daily -  before meals and at bedtime.   . Insulin Pen Needle (UNIFINE PENTIPS) 32G X 4 MM MISC Use to inject insulin 6x daily   . metFORMIN (GLUCOPHAGE-XR) 500 MG 24 hr tablet TAKE 3 TABLETS BY MOUTH AT BREAKFAST   . sertraline (ZOLOFT) 100 MG tablet Take one and a half tablets daily (150 mg)   . traZODone (DESYREL) 100 MG tablet Take 1 tablet (100 mg total) by mouth at bedtime.   . TRESIBA FLEXTOUCH 200 UNIT/ML FlexTouch Pen INJECT 100 UNITS UNDER THE SKIN DAILY AS DIRECTED   . amphetamine-dextroamphetamine (ADDERALL XR) 20 MG 24 hr capsule Take 1 capsule (20 mg total) by mouth daily. (Patient not taking: Reported on 05/02/2020)   . amphetamine-dextroamphetamine (ADDERALL XR) 20 MG 24 hr capsule Take 1 capsule (20 mg total) by mouth daily. (Patient not taking: Reported on 05/02/2020)   . ELINEST 0.3-30 MG-MCG tablet Take 0.3 mg by mouth at bedtime. (Patient not taking: Reported on 05/02/2020)   . glucagon 1 MG injection Follow package directions for low blood sugar. 01/09/2019: Never used   No facility-administered encounter medications on file as of 05/02/2020.    Allergies: Allergies  Allergen Reactions  . Grapefruit Concentrate   . Ibuprofen     Throat swelling  . Naproxen   . Other     Seaweed, bee sting (anaphalytic)      Allergic to seaweed  Surgical History: Past Surgical History:  Procedure Laterality Date  . WISDOM TOOTH EXTRACTION      Family History:  Family History  Problem Relation Age of Onset  . Diabetes Maternal Grandmother   . Diabetes Maternal Grandfather    Has 12 siblings.    Social History: Lives with: mother, father and siblings (1of 13 children)   Physical Exam:  Vitals:   05/02/20 1100  BP: 122/80  Pulse: 88  Weight: (!) 327 lb 12.8 oz (148.7 kg)  Height:  5' 7.99" (1.727 m)   BP 122/80   Pulse 88   Ht 5' 7.99" (1.727 m)   Wt (!) 327 lb 12.8 oz (148.7 kg)   LMP 04/11/2020 (Within Days)   BMI 49.85 kg/m  Body mass index: body mass index is 49.85 kg/m. Growth percentile SmartLinks can only be used for patients less than 27 years old.  Ht Readings from Last 3 Encounters:  05/02/20 5' 7.99" (1.727 m)  01/31/20 5\' 8"  (1.727 m) (93 %, Z= 1.45)*  05/10/19 5\' 8"  (1.727 m) (93 %, Z= 1.46)*   * Growth percentiles are based on CDC (Girls, 2-20 Years) data.   Wt Readings from Last 3 Encounters:  05/02/20 (!) 327 lb 12.8 oz (148.7 kg)  01/31/20 (!) 335 lb 9.6 oz (152.2 kg) (>99 %, Z= 3.04)*  10/31/19 (!) 326 lb (147.9 kg) (>99 %, Z= 2.97)*   * Growth percentiles are based on CDC (Girls, 2-20 Years) data.   General: Obese female in no acute distress.   Head: Normocephalic, atraumatic.   Eyes:  Pupils equal and round. EOMI.   Sclera white.  No eye drainage.   Ears/Nose/Mouth/Throat: Nares patent, no nasal drainage.  Normal dentition, mucous membranes moist.   Neck: supple, no cervical lymphadenopathy, no thyromegaly Cardiovascular: regular rate, normal S1/S2, no murmurs Respiratory: No increased work of breathing.  Lungs clear to auscultation bilaterally.  No wheezes. Abdomen: soft, nontender, nondistended. Normal bowel sounds.  No appreciable masses  Extremities: warm, well perfused, cap refill < 2 sec.   Musculoskeletal: Normal muscle mass.  Normal strength Skin:  warm, dry.  No rash or lesions. + acanthosis nigricans to posterior neck.  Neurologic: alert and oriented, normal speech, no tremor   Labs: Last hemoglobin A1c: 9.7% on 01/2020  Lab Results  Component Value Date   HGBA1C 8.6 (A) 05/02/2020     Assessment/Plan: TASHEBA HENSON is a 20 y.o. female with  Uncontrolled T1DM on insulin pump, Tresiba and metformin therapy. Relying on injections for majority of her insulin but does not want to stop using Omnipod (which primarily is just using basal insulin). Her hemoglobin A1c has improved to 8.6% today.   1. Uncontrolled diabetes mellitus type 1 without complications (HCC) - Metformin XR 1500 mg.  - Reviewed insulin pump and CGM download. Discussed trends and patterns.  - Rotate pump sites to prevent scar tissue.  - bolus 15 minutes prior to eating to limit blood sugar spikes.  - Reviewed carb counting and importance of accurate carb counting.  - Discussed signs and symptoms of hypoglycemia. Always have glucose available.  - POCT glucose and hemoglobin A1c  - Reviewed growth chart.  - Encouraged to use insulin pump to bolus with. Goal is to primarily use insulin pump + tresiba for basal insulin.   2. Insulin pump titration - Tresiba 100 units   3. Obesity  - Discussed importance of healthy diet and exercise to reduce insulin resistance.  - Reviewed diet and made suggestions for changes.  - Exercise at least 30 minutes per day.   4. Irregular periods -Followed by adolescent med.   5. Adjustment Reaction /bipolar disorder  - close follow up with psych.   Follow-up:  3 months.    Medical decision-making:  >45 spent today reviewing the medical chart, counseling the patient/family, and documenting today's visit.    When a patient is on insulin, intensive monitoring of blood glucose levels is necessary to avoid hyperglycemia and hypoglycemia. Severe hyperglycemia/hypoglycemia can lead to hospital  admissions and be life threatening.     Lydia ShortSpenser Marlaine Arey,  FNP-C  Pediatric Specialist  [address]  [place], [id number]  Tele: [phone]

## 2020-05-04 DIAGNOSIS — E109 Type 1 diabetes mellitus without complications: Secondary | ICD-10-CM | POA: Diagnosis not present

## 2020-05-04 DIAGNOSIS — Z794 Long term (current) use of insulin: Secondary | ICD-10-CM | POA: Diagnosis not present

## 2020-05-09 MED FILL — TRESIBA FLEXTOUCH 200 UNITS: 200 | 36 days supply | Qty: 18 | Fill #3

## 2020-05-21 DIAGNOSIS — E10649 Type 1 diabetes mellitus with hypoglycemia without coma: Secondary | ICD-10-CM | POA: Diagnosis not present

## 2020-05-21 DIAGNOSIS — E1065 Type 1 diabetes mellitus with hyperglycemia: Secondary | ICD-10-CM | POA: Diagnosis not present

## 2020-05-25 ENCOUNTER — Other Ambulatory Visit (HOSPITAL_BASED_OUTPATIENT_CLINIC_OR_DEPARTMENT_OTHER): Payer: Self-pay

## 2020-05-30 DIAGNOSIS — A630 Anogenital (venereal) warts: Secondary | ICD-10-CM | POA: Diagnosis not present

## 2020-05-30 DIAGNOSIS — N7689 Other specified inflammation of vagina and vulva: Secondary | ICD-10-CM | POA: Diagnosis not present

## 2020-05-30 DIAGNOSIS — Z202 Contact with and (suspected) exposure to infections with a predominantly sexual mode of transmission: Secondary | ICD-10-CM | POA: Diagnosis not present

## 2020-05-30 DIAGNOSIS — N898 Other specified noninflammatory disorders of vagina: Secondary | ICD-10-CM | POA: Diagnosis not present

## 2020-06-01 ENCOUNTER — Other Ambulatory Visit (HOSPITAL_COMMUNITY): Payer: Self-pay | Admitting: Medical

## 2020-06-01 MED FILL — TERCONAZOLE 0.4% CREAM: 0.4 | 7 days supply | Qty: 45 | Fill #0

## 2020-06-01 MED FILL — FLUCONAZOLE 150 MG TABS: 150 | 3 days supply | Qty: 2 | Fill #0

## 2020-06-05 ENCOUNTER — Telehealth (HOSPITAL_COMMUNITY): Payer: Self-pay

## 2020-06-05 ENCOUNTER — Other Ambulatory Visit (HOSPITAL_COMMUNITY): Payer: Self-pay | Admitting: Psychiatry

## 2020-06-05 ENCOUNTER — Other Ambulatory Visit (HOSPITAL_COMMUNITY): Payer: Self-pay

## 2020-06-05 DIAGNOSIS — F3341 Major depressive disorder, recurrent, in partial remission: Secondary | ICD-10-CM

## 2020-06-05 MED ORDER — BUPROPION HCL ER (XL) 150 MG PO TB24
ORAL_TABLET | Freq: Every morning | ORAL | 1 refills | Status: DC
  Filled 2020-06-05: qty 30, 30d supply, fill #0

## 2020-06-05 MED ORDER — TRAZODONE HCL 100 MG PO TABS
ORAL_TABLET | Freq: Every day | ORAL | 1 refills | Status: DC
  Filled 2020-06-05: qty 90, 90d supply, fill #0

## 2020-06-05 MED ORDER — SERTRALINE HCL 100 MG PO TABS
ORAL_TABLET | Freq: Every day | ORAL | 1 refills | Status: DC
  Filled 2020-06-05: qty 45, 30d supply, fill #0

## 2020-06-05 MED FILL — Sertraline HCl Tab 100 MG: ORAL | 30 days supply | Qty: 45 | Fill #0 | Status: CN

## 2020-06-05 MED FILL — Insulin Degludec Soln Pen-Injector 200 Unit/ML: SUBCUTANEOUS | 36 days supply | Qty: 18 | Fill #0 | Status: AC

## 2020-06-05 MED FILL — Nicotine TD Patch 24HR 21 MG/24HR: TRANSDERMAL | 28 days supply | Qty: 28 | Fill #0 | Status: AC

## 2020-06-05 NOTE — Telephone Encounter (Signed)
Patient called requesting refills on her medication. She's using Lydia Estrada and has a scheduled followup appointment for 5/9. Please review and advise. Thank you

## 2020-06-05 NOTE — Telephone Encounter (Signed)
done

## 2020-06-06 ENCOUNTER — Other Ambulatory Visit (HOSPITAL_COMMUNITY): Payer: Self-pay

## 2020-06-07 ENCOUNTER — Other Ambulatory Visit (HOSPITAL_COMMUNITY): Payer: Self-pay

## 2020-06-08 ENCOUNTER — Other Ambulatory Visit (HOSPITAL_COMMUNITY): Payer: Self-pay

## 2020-06-08 MED ORDER — NICOTINE POLACRILEX 4 MG MT GUM
CHEWING_GUM | OROMUCOSAL | 0 refills | Status: DC
  Filled 2020-06-08: qty 110, 20d supply, fill #0

## 2020-06-09 ENCOUNTER — Other Ambulatory Visit (HOSPITAL_COMMUNITY): Payer: Self-pay

## 2020-06-12 ENCOUNTER — Other Ambulatory Visit (HOSPITAL_COMMUNITY): Payer: Self-pay

## 2020-06-13 ENCOUNTER — Other Ambulatory Visit (HOSPITAL_COMMUNITY): Payer: Self-pay

## 2020-06-13 NOTE — Telephone Encounter (Signed)
Notified patient/[facility] is sending her medications out to her

## 2020-06-14 ENCOUNTER — Other Ambulatory Visit (HOSPITAL_COMMUNITY): Payer: Self-pay

## 2020-06-15 ENCOUNTER — Other Ambulatory Visit (HOSPITAL_COMMUNITY): Payer: Self-pay

## 2020-06-15 MED FILL — Insulin Lispro Soln Pen-injector 200 Unit/ML: SUBCUTANEOUS | 12 days supply | Qty: 9 | Fill #0 | Status: AC

## 2020-06-15 MED FILL — Insulin Lispro Soln Pen-injector 200 Unit/ML: SUBCUTANEOUS | 68 days supply | Qty: 51 | Fill #0 | Status: AC

## 2020-06-18 ENCOUNTER — Other Ambulatory Visit (HOSPITAL_COMMUNITY): Payer: Self-pay

## 2020-07-06 ENCOUNTER — Other Ambulatory Visit (HOSPITAL_COMMUNITY): Payer: Self-pay

## 2020-07-09 ENCOUNTER — Other Ambulatory Visit (HOSPITAL_COMMUNITY): Payer: Self-pay

## 2020-07-09 ENCOUNTER — Encounter (HOSPITAL_COMMUNITY): Payer: Self-pay | Admitting: Psychiatry

## 2020-07-09 ENCOUNTER — Telehealth (INDEPENDENT_AMBULATORY_CARE_PROVIDER_SITE_OTHER): Payer: 59 | Admitting: Psychiatry

## 2020-07-09 ENCOUNTER — Other Ambulatory Visit: Payer: Self-pay

## 2020-07-09 DIAGNOSIS — F3341 Major depressive disorder, recurrent, in partial remission: Secondary | ICD-10-CM

## 2020-07-09 DIAGNOSIS — F900 Attention-deficit hyperactivity disorder, predominantly inattentive type: Secondary | ICD-10-CM | POA: Diagnosis not present

## 2020-07-09 MED ORDER — TRAZODONE HCL 100 MG PO TABS
ORAL_TABLET | Freq: Every day | ORAL | 1 refills | Status: DC
  Filled 2020-07-09: qty 90, fill #0

## 2020-07-09 MED ORDER — SERTRALINE HCL 100 MG PO TABS
ORAL_TABLET | Freq: Every day | ORAL | 1 refills | Status: DC
  Filled 2020-07-09: qty 45, 30d supply, fill #0

## 2020-07-09 MED ORDER — AMPHETAMINE-DEXTROAMPHET ER 20 MG PO CP24
20.0000 mg | ORAL_CAPSULE | Freq: Every day | ORAL | 0 refills | Status: DC
  Filled 2020-10-22: qty 30, 30d supply, fill #0

## 2020-07-09 MED ORDER — BUSPIRONE HCL 10 MG PO TABS
10.0000 mg | ORAL_TABLET | Freq: Three times a day (TID) | ORAL | 1 refills | Status: DC
Start: 2020-07-09 — End: 2020-09-06
  Filled 2020-07-09: qty 90, 30d supply, fill #0

## 2020-07-09 MED ORDER — BUPROPION HCL ER (XL) 150 MG PO TB24
ORAL_TABLET | Freq: Every morning | ORAL | 1 refills | Status: DC
  Filled 2020-07-09: qty 30, 30d supply, fill #0

## 2020-07-09 MED ORDER — AMPHETAMINE-DEXTROAMPHET ER 20 MG PO CP24
20.0000 mg | ORAL_CAPSULE | Freq: Every day | ORAL | 0 refills | Status: DC
  Filled 2020-07-09: qty 30, 30d supply, fill #0

## 2020-07-09 NOTE — Progress Notes (Signed)
BH MD OP Progress Note   Virtual Visit via Telephone Note  I connected with Percival SpanishKortney E Norment on 07/09/20 at  9:40 AM EDT by telephone and verified that I am speaking with the correct person using two identifiers.  Location: Patient: home Provider: Clinic   I discussed the limitations, risks, security and privacy concerns of performing an evaluation and management service by telephone and the availability of in person appointments. I also discussed with the patient that there may be a patient responsible charge related to this service. The patient expressed understanding and agreed to proceed.   I provided 16 minutes of non-face-to-face time during this encounter.      07/09/2020 1:03 PM Percival SpanishKortney E Dise  MRN:  [id number]  Chief Complaint: " I am doing better."  HPI: Patient reported that starting Wellbutrin in January was very helpful for her depression.  She stated that she has not been depressed as she used to be.  She stated that she still taking sertraline with Wellbutrin regularly and that helps. However her main concern was that she continues to deal with a lot of anxiety which prevents her from being able to keep a job. She stated that she has not worked since January and she does not feel like she can hold a job for too long. She informed that her sister moved in with them recently and she brought her dog with her and ever since then she feels her mood is improved.  She stated that she has been getting out of the house more as a dog walker and she thinks that her sisters presents has been quite helpful for her. She stated that she likes the combination of Wellbutrin and sertraline but wants to try something different for anxiety.  She informed that her sister also took hydroxyzine in the past but it was not effective for her therefore she was switched to buspirone which helped her immensely.  Patient wants to try that. She denies any other concerns today.  Visit Diagnosis:     ICD-10-CM   1. Attention deficit hyperactivity disorder (ADHD), predominantly inattentive type  F90.0 amphetamine-dextroamphetamine (ADDERALL XR) 20 MG 24 hr capsule    amphetamine-dextroamphetamine (ADDERALL XR) 20 MG 24 hr capsule  2. MDD (major depressive disorder), recurrent, in partial remission (HCC)  F33.41 buPROPion (WELLBUTRIN XL) 150 MG 24 hr tablet    sertraline (ZOLOFT) 100 MG tablet    traZODone (DESYREL) 100 MG tablet    busPIRone (BUSPAR) 10 MG tablet    Past Psychiatric History: Depression, anxiety, PTSD  Past Medical History:  Past Medical History:  Diagnosis Date  . Herpes    type 1 and 2  . Type 1 diabetes mellitus (HCC)    Dx 03/2017, A1c 11%, presented in DKA. GAD antibodies markedly positive at 1493 (<5)    Past Surgical History:  Procedure Laterality Date  . WISDOM TOOTH EXTRACTION        Family History:  Family History  Problem Relation Age of Onset  . Diabetes Maternal Grandmother   . Diabetes Maternal Grandfather     Social History:  Social History   Socioeconomic History  . Marital status: Single    Spouse name: Not on file  . Number of children: Not on file  . Years of education: Not on file  . Highest education level: Not on file  Occupational History  . Not on file  Tobacco Use  . Smoking status: Current Every Day Smoker  . Smokeless tobacco: Never  Used  Vaping Use  . Vaping Use: Every day  Substance and Sexual Activity  . Alcohol use: No  . Drug use: No  . Sexual activity: Not on file  Other Topics Concern  . Not on file  Social History Narrative   Working at [employer] in [place]. Likes the job. Lives with mom, dad, little sister, 2 dog, 4 cats.   Social Determinants of Health   Financial Resource Strain: Not on file  Food Insecurity: Not on file  Transportation Needs: Not on file  Physical Activity: Not on file  Stress: Not on file  Social Connections: Not on file    Allergies:  Allergies  Allergen Reactions   . Grapefruit Concentrate   . Ibuprofen     Throat swelling  . Naproxen   . Other     Seaweed, bee sting (anaphalytic)     Metabolic Disorder Labs: Lab Results  Component Value Date   HGBA1C 8.6 (A) 05/02/2020   MPG 214.47 12/28/2018   MPG 269 03/29/2017   No results found for: PROLACTIN Lab Results  Component Value Date   CHOL 157 09/10/2018   TRIG 159 (H) 09/10/2018   HDL 51 09/10/2018   CHOLHDL 3.1 09/10/2018   LDLCALC 80 09/10/2018   Lab Results  Component Value Date   TSH 2.791 12/28/2018   TSH 0.96 09/10/2018    Therapeutic Level Labs: No results found for: LITHIUM No results found for: VALPROATE No components found for:  CBMZ  Current Medications: Current Outpatient Medications  Medication Sig Dispense Refill  . busPIRone (BUSPAR) 10 MG tablet Take 1 tablet (10 mg total) by mouth 3 (three) times daily. 90 tablet 1  . amphetamine-dextroamphetamine (ADDERALL XR) 20 MG 24 hr capsule TAKE 1 CAPSULE BY MOUTH ONCE A DAY 30 capsule 0  . amphetamine-dextroamphetamine (ADDERALL XR) 20 MG 24 hr capsule Take 1 capsule (20 mg total) by mouth daily. 30 capsule 0  . [START ON 08/08/2020] amphetamine-dextroamphetamine (ADDERALL XR) 20 MG 24 hr capsule Take 1 capsule (20 mg total) by mouth daily. 30 capsule 0  . buPROPion (WELLBUTRIN XL) 150 MG 24 hr tablet TAKE 1 TABLET BY MOUTH EVERY MORNING 30 tablet 1  . ELINEST 0.3-30 MG-MCG tablet Take 0.3 mg by mouth at bedtime. (Patient not taking: Reported on 05/02/2020)    . fluconazole (DIFLUCAN) 150 MG tablet TAKE 1 TABLET BY MOUTH NOW, REPEAT IN 3 DAYS. 2 tablet 0  . glucagon 1 MG injection Follow package directions for low blood sugar. 2 each 6  . insulin degludec (TRESIBA) 200 UNIT/ML FlexTouch Pen INJECT 100 UNITS UNDER THE SKIN DAILY AS DIRECTED 18 mL 6  . Insulin Human (INSULIN PUMP) SOLN Inject 1 each into the skin 4 (four) times daily -  before meals and at bedtime.    . insulin lispro (HUMALOG) 200 UNIT/ML KwikPen INJECT 150  UNITS INTO THE SKIN DAILY. 60 mL 6  . Insulin Pen Needle 32G X 4 MM MISC USE TO INJECT INSULIN 6 TIMES A DAY 600 each 1  . metFORMIN (GLUCOPHAGE-XR) 500 MG 24 hr tablet TAKE 3 TABLETS BY MOUTH AT BREAKFAST 270 tablet 1  . metroNIDAZOLE (FLAGYL) 500 MG tablet TAKE 1 TABLET BY MOUTH 2 TIMES DAILY FOR 7 DAYS 14 tablet 0  . nicotine (NICODERM CQ - DOSED IN MG/24 HOURS) 14 mg/24hr patch APPLY 1 PATCH ONTO THE SKIN 28 patch 1  . nicotine (NICODERM CQ - DOSED IN MG/24 HOURS) 21 mg/24hr patch APPLY 1  PATCH ONTO THE SKIN ONCE DAILY 28 patch 1  . nicotine (NICODERM CQ - DOSED IN MG/24 HR) 7 mg/24hr patch APPLY 1 PATCH ONTO THE SKIN DAILY 28 patch 1  . nicotine polacrilex (NICORETTE) 4 MG gum USE AS DIRECTED UP TO 24 PIECES PER DAY. 110 each 0  . nicotine polacrilex (NICORETTE) 4 MG gum Chew up to 24 pieces daily as directed 110 each 0  . sertraline (ZOLOFT) 100 MG tablet TAKE 1 AND 1/2 TABLET BY MOUTH DAILY 45 tablet 1  . terconazole (TERAZOL 7) 0.4 % vaginal cream INSERT 1 APPLICATORFUL EVERY DAY BY VAGINAL ROUTE AT BEDTIME FOR 7 DAYS. 45 g 3  . traZODone (DESYREL) 100 MG tablet TAKE 1 TABLET BY MOUTH AT BEDTIME 90 tablet 1   No current facility-administered medications for this visit.      Psychiatric Specialty Exam: Review of Systems  There were no vitals taken for this visit.There is no height or weight on file to calculate BMI.  General Appearance: unable to assess due to phone visit  Eye Contact:  unable to assess due to phone visit  Speech:  Clear and Coherent and Normal Rate  Volume:  Normal  Mood:  Anxious  Affect:  Congruent  Thought Process:  Goal Directed, Linear and Descriptions of Associations: Intact  Orientation:  Full (Time, Place, and Person)  Thought Content: Logical   Suicidal Thoughts:  No  Homicidal Thoughts:  No  Memory:  Recent;   Good Remote;   Good  Judgement:  Fair  Insight:  Fair  Psychomotor Activity:  Normal  Concentration:  Concentration: Good and Attention  Span: Good  Recall:  Good  Fund of Knowledge: Good  Language: Good  Akathisia:  Negative  Handed:  Right  AIMS (if indicated): not done  Assets:  Communication Skills Desire for Improvement Financial Resources/Insurance Housing  ADL's:  Intact  Cognition: WNL  Sleep:  Good with help of Trazodone   Screenings: AIMS   Flowsheet Row [facility] Most recent reading at 01/04/2019 10:28 AM Admission (Discharged) from [facility]28[facility] Most recent reading at 12/28/2018  3:45 AM  AIMS Total Score 0 1    Flowsheet Row [facility]) [facility] Most recent reading at 12/28/2018  6:00 PM Admission (Discharged) from 12/28/2018 in [facility] Most recent reading at 12/28/2018  3:37 AM  C-SSRS RISK CATEGORY High Risk Moderate Risk       Assessment and Plan: Patient reported that her depression symptoms improved significantly after she started taking Wellbutrin.  However she continues to experience a lot of anxiety symptoms and asked if she can try buspirone as her sister had great results with it recently. Potential side effects of medication and risks vs benefits of treatment vs non-treatment were explained and discussed. All questions were answered.   1. Attention deficit hyperactivity disorder (ADHD), predominantly inattentive type  - amphetamine-dextroamphetamine (ADDERALL XR) 20 MG 24 hr capsule; Take 1 capsule (20 mg total) by mouth daily.  Dispense: 30 capsule; Refill: 0 - amphetamine-dextroamphetamine (ADDERALL XR) 20 MG 24 hr capsule; Take 1 capsule (20 mg total) by mouth daily.  Dispense: 30 capsule; Refill: 0  2. MDD (major depressive disorder), recurrent, in partial remission (HCC)  - buPROPion (WELLBUTRIN XL) 150 MG 24 hr tablet; TAKE 1 TABLET BY MOUTH EVERY MORNING  Dispense: 30 tablet;  Refill: 1 - sertraline (ZOLOFT) 100  MG tablet; TAKE 1 AND 1/2 TABLET BY MOUTH DAILY  Dispense: 45 tablet; Refill: 1 - traZODone (DESYREL) 100 MG tablet; TAKE 1 TABLET BY MOUTH AT BEDTIME  Dispense: 90 tablet; Refill: 1 - Start busPIRone (BUSPAR) 10 MG tablet; Take 1 tablet (10 mg total) by mouth 3 (three) times daily.  Dispense: 90 tablet; Refill: 1 - Discontinue Hydroxyzine due to lack of efficacy.   Follow-up in 2 months. Patient was informed that provider is leaving the office therefore care is being transferred to a different provider and [facility] psychiatry clinic.  She verbalized her understanding.  Zena Amos, MD 07/09/2020, 1:03 PM

## 2020-08-02 DIAGNOSIS — Z794 Long term (current) use of insulin: Secondary | ICD-10-CM | POA: Diagnosis not present

## 2020-08-02 DIAGNOSIS — E109 Type 1 diabetes mellitus without complications: Secondary | ICD-10-CM | POA: Diagnosis not present

## 2020-08-06 ENCOUNTER — Other Ambulatory Visit (HOSPITAL_COMMUNITY): Payer: Self-pay

## 2020-08-06 MED FILL — Metformin HCl Tab ER 24HR 500 MG: ORAL | 90 days supply | Qty: 270 | Fill #0 | Status: AC

## 2020-08-06 MED FILL — Insulin Degludec Soln Pen-Injector 200 Unit/ML: SUBCUTANEOUS | 36 days supply | Qty: 18 | Fill #1 | Status: AC

## 2020-08-08 ENCOUNTER — Ambulatory Visit (INDEPENDENT_AMBULATORY_CARE_PROVIDER_SITE_OTHER): Payer: 59 | Admitting: Family

## 2020-08-16 DIAGNOSIS — E10649 Type 1 diabetes mellitus with hypoglycemia without coma: Secondary | ICD-10-CM | POA: Diagnosis not present

## 2020-08-16 DIAGNOSIS — E1065 Type 1 diabetes mellitus with hyperglycemia: Secondary | ICD-10-CM | POA: Diagnosis not present

## 2020-09-01 DIAGNOSIS — Z794 Long term (current) use of insulin: Secondary | ICD-10-CM | POA: Diagnosis not present

## 2020-09-01 DIAGNOSIS — E109 Type 1 diabetes mellitus without complications: Secondary | ICD-10-CM | POA: Diagnosis not present
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
## 2020-09-06 ENCOUNTER — Other Ambulatory Visit: Payer: Self-pay

## 2020-09-06 ENCOUNTER — Other Ambulatory Visit (HOSPITAL_COMMUNITY): Payer: Self-pay

## 2020-09-06 ENCOUNTER — Telehealth (INDEPENDENT_AMBULATORY_CARE_PROVIDER_SITE_OTHER): Payer: 59 | Admitting: Psychiatry

## 2020-09-06 ENCOUNTER — Telehealth: Payer: 59 | Admitting: Psychiatry

## 2020-09-06 ENCOUNTER — Telehealth: Payer: Self-pay | Admitting: Psychiatry

## 2020-09-06 ENCOUNTER — Encounter: Payer: Self-pay | Admitting: Psychiatry

## 2020-09-06 DIAGNOSIS — F3341 Major depressive disorder, recurrent, in partial remission: Secondary | ICD-10-CM

## 2020-09-06 DIAGNOSIS — R41840 Attention and concentration deficit: Secondary | ICD-10-CM | POA: Diagnosis not present

## 2020-09-06 DIAGNOSIS — F4310 Post-traumatic stress disorder, unspecified: Secondary | ICD-10-CM

## 2020-09-06 MED ORDER — AMPHETAMINE-DEXTROAMPHET ER 20 MG PO CP24
20.0000 mg | ORAL_CAPSULE | ORAL | 0 refills | Status: DC
  Filled 2020-09-06: qty 30, 30d supply, fill #0

## 2020-09-06 MED ORDER — TRAZODONE HCL 100 MG PO TABS
ORAL_TABLET | Freq: Every day | ORAL | 1 refills | Status: DC
  Filled 2020-09-06: qty 90, 90d supply, fill #0
  Filled 2020-10-22: qty 90, 90d supply, fill #1

## 2020-09-06 MED ORDER — BUSPIRONE HCL 10 MG PO TABS
10.0000 mg | ORAL_TABLET | Freq: Three times a day (TID) | ORAL | 1 refills | Status: DC
  Filled 2020-09-06: qty 90, 30d supply, fill #0
  Filled 2020-10-22: qty 90, 30d supply, fill #1

## 2020-09-06 MED ORDER — AMPHETAMINE-DEXTROAMPHET ER 20 MG PO CP24
20.0000 mg | ORAL_CAPSULE | ORAL | 0 refills | Status: DC
  Filled 2020-10-22: qty 30, 30d supply, fill #0

## 2020-09-06 MED ORDER — BUPROPION HCL ER (XL) 150 MG PO TB24
ORAL_TABLET | Freq: Every morning | ORAL | 1 refills | Status: DC
Start: 2020-09-06 — End: 2020-10-25
  Filled 2020-09-06: qty 30, 30d supply, fill #0
  Filled 2020-10-22: qty 30, 30d supply, fill #1

## 2020-09-06 MED ORDER — AMPHETAMINE-DEXTROAMPHET ER 20 MG PO CP24: 20.0000 mg | ORAL_CAPSULE | ORAL | 0 refills | Status: DC

## 2020-09-06 MED ORDER — SERTRALINE HCL 100 MG PO TABS
ORAL_TABLET | Freq: Every day | ORAL | 1 refills | Status: DC
  Filled 2020-09-06: qty 45, 30d supply, fill #0
  Filled 2020-10-22: qty 45, 30d supply, fill #1
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
## 2020-09-06 NOTE — Patient Instructions (Signed)
Continue sertraline 150 mg daily Continue bupropion 150 mg Continue buspar 10 mg three times a day Continue Adderall 20 mg daily Referral to neuropsych evaluation for ADHD Next appointment- 8/25 at 11 Am

## 2020-09-06 NOTE — Telephone Encounter (Signed)
Sent link for video visit through Epic. Patient did not sign in. Called the patient for appointment scheduled today. The patient did not answer the phone. Left voice message to contact the office ([phone]).   ?

## 2020-09-07 ENCOUNTER — Other Ambulatory Visit (HOSPITAL_COMMUNITY): Payer: Self-pay

## 2020-09-07 ENCOUNTER — Encounter: Payer: Self-pay | Admitting: Psychology

## 2020-09-07 MED FILL — Terconazole Vaginal Cream 0.4%: VAGINAL | 7 days supply | Qty: 45 | Fill #0 | Status: AC

## 2020-09-07 MED FILL — Terconazole Vaginal Cream 0.4%: VAGINAL | 7 days supply | Qty: 45 | Fill #0 | Status: CN

## 2020-09-10 ENCOUNTER — Other Ambulatory Visit (HOSPITAL_COMMUNITY): Payer: Self-pay

## 2020-09-10 MED FILL — Insulin Degludec Soln Pen-Injector 200 Unit/ML: SUBCUTANEOUS | 36 days supply | Qty: 18 | Fill #2 | Status: AC

## 2020-09-12 ENCOUNTER — Ambulatory Visit (HOSPITAL_COMMUNITY): Payer: 59 | Admitting: Licensed Clinical Social Worker

## 2020-09-14 ENCOUNTER — Other Ambulatory Visit (HOSPITAL_COMMUNITY): Payer: Self-pay

## 2020-09-27 IMAGING — CT CT ABD-PELV W/ CM
2 of 4 series · 16 of 46 positions shown, 18 images · IV contrast (omnipaque)
Comparison: 01/07/2019

CLINICAL DATA: Right upper quadrant abdominal pain

EXAM:
CT ABDOMEN AND PELVIS WITH CONTRAST
TECHNIQUE: Multidetector CT imaging of the abdomen and pelvis was performed
using the standard protocol following bolus administration of
intravenous contrast.
CONTRAST:  100mL OMNIPAQUE IOHEXOL 300 MG/ML  SOLN

[Series 2: axial st · axial · 0.98mm/px · z∈[-537,-12]mm · 13 of 119 slices shown, 15 images]
[im 7/119  soft-tissue]
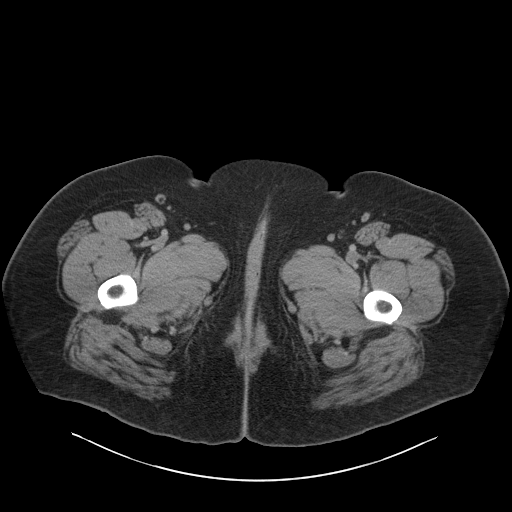
[im 7/119  bone]
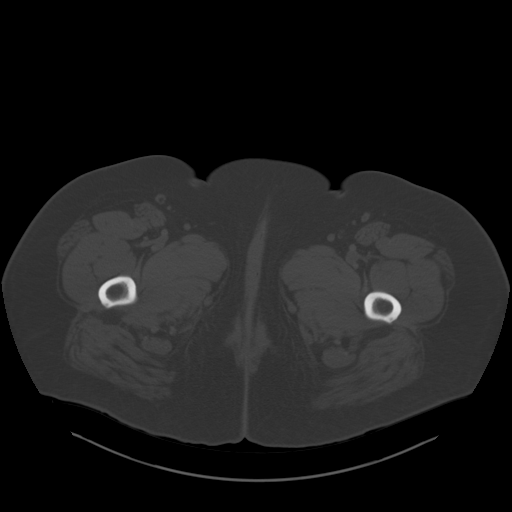
[im 19/119  soft-tissue]
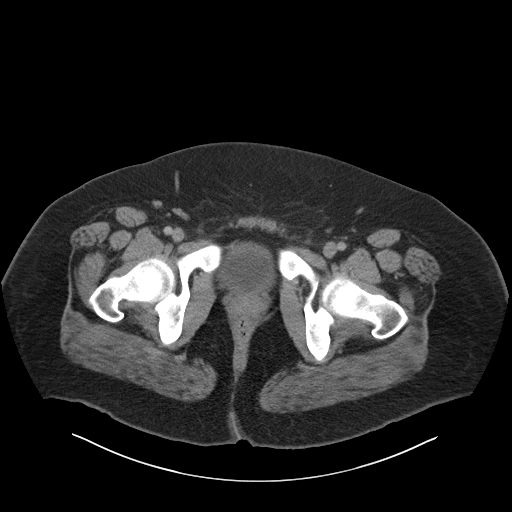
[im 25/119  soft-tissue]
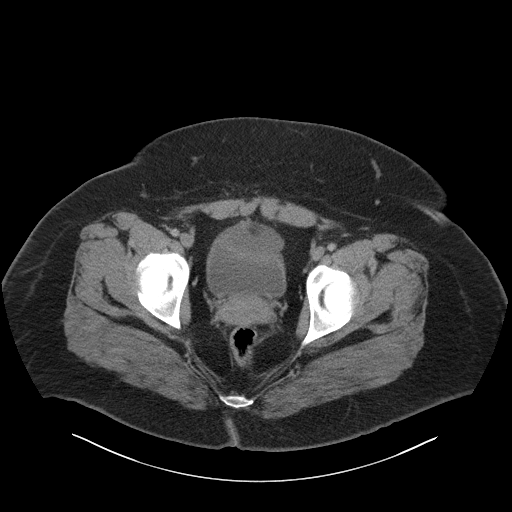
[im 32/119  soft-tissue]
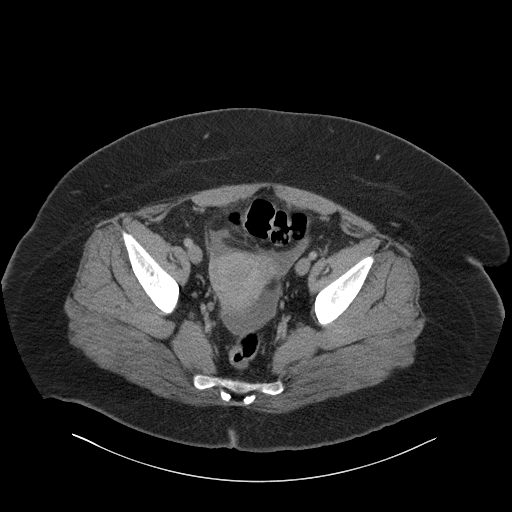
[im 44/119  soft-tissue]
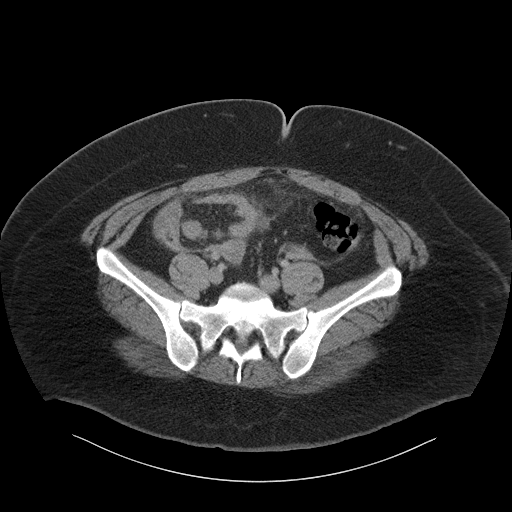
[im 50/119  soft-tissue]
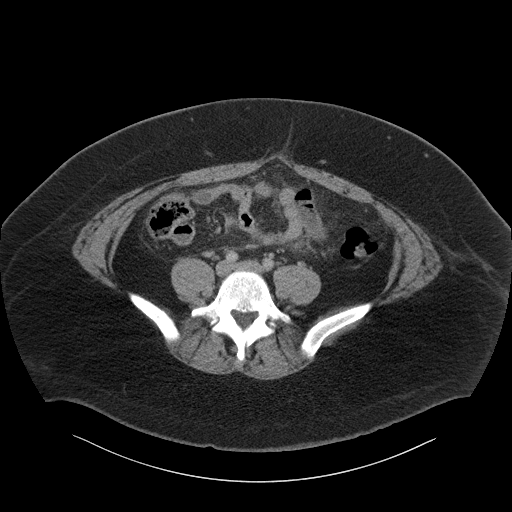
[im 63/119  soft-tissue]
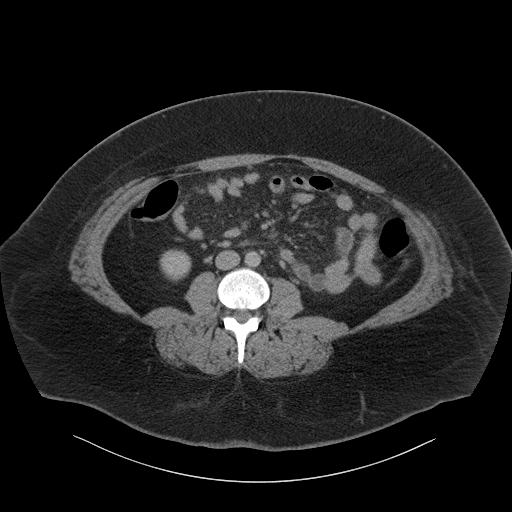
[im 69/119  soft-tissue]
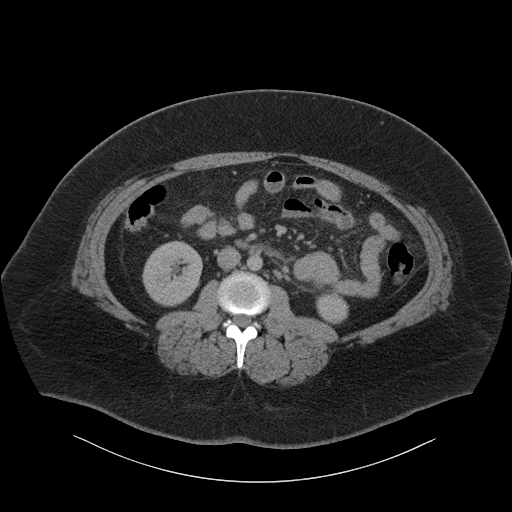
[im 75/119  soft-tissue]
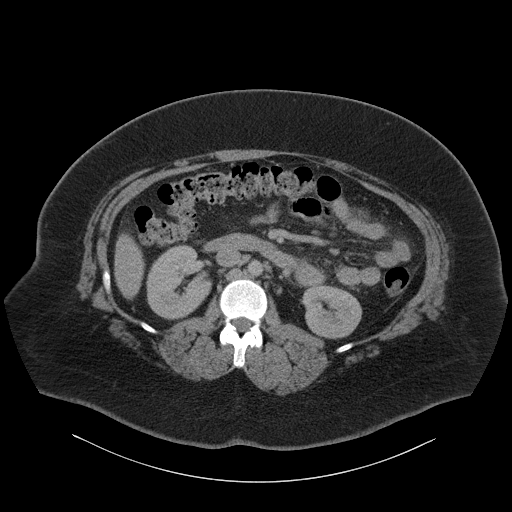
[im 75/119  bone]
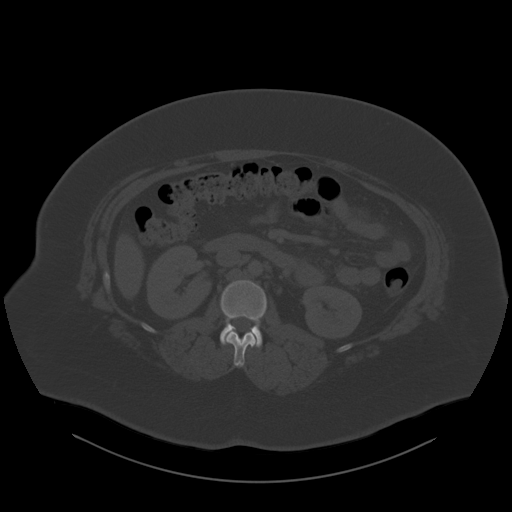
[im 87/119  soft-tissue]
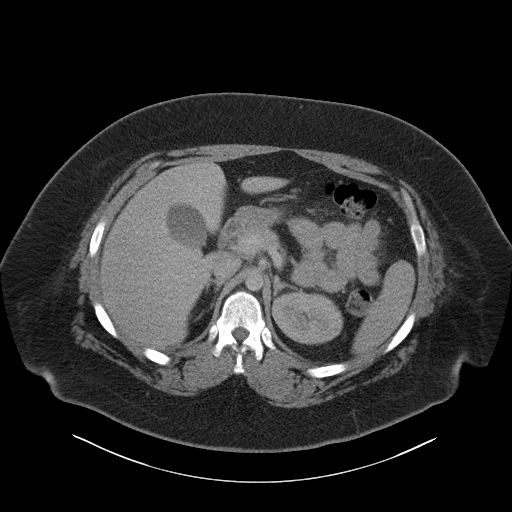
[im 94/119  soft-tissue]
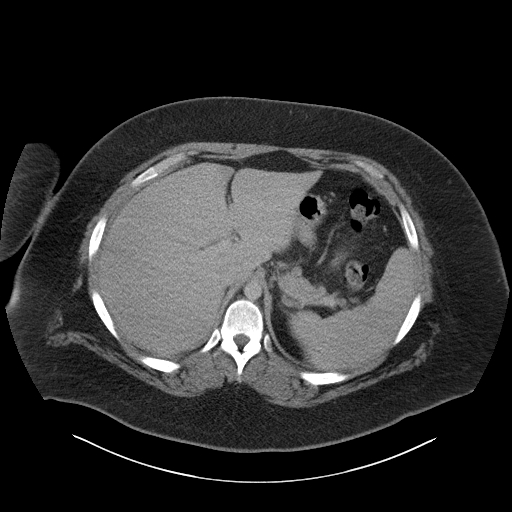
[im 100/119  soft-tissue]
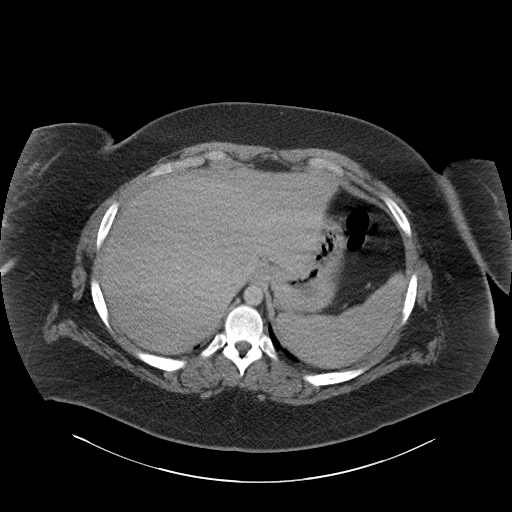
[im 112/119  soft-tissue]
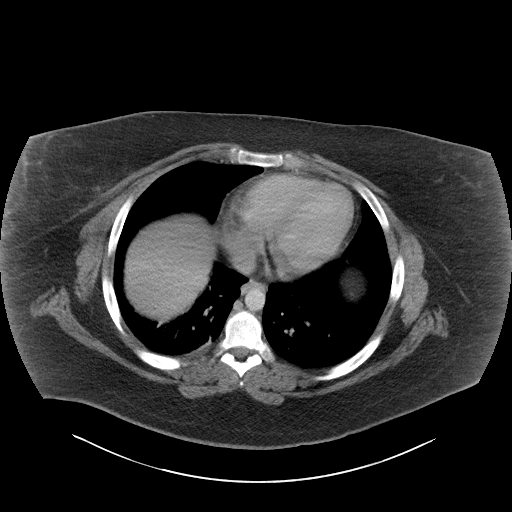

[Series 4: coronal st · coronal · 1.13mm/px · 3 of 164 slices shown]
[im 55/164  soft-tissue]
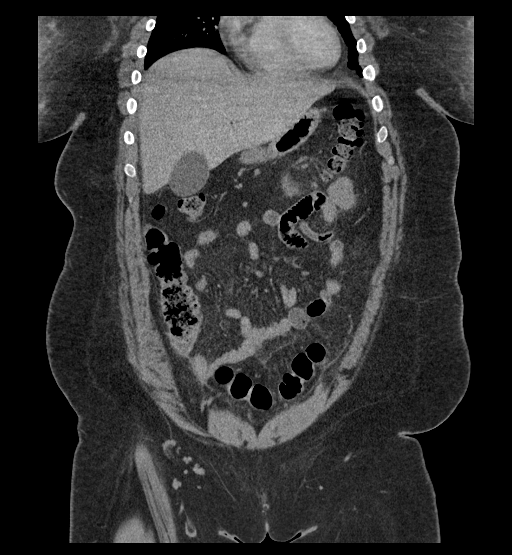
[im 73/164  soft-tissue]
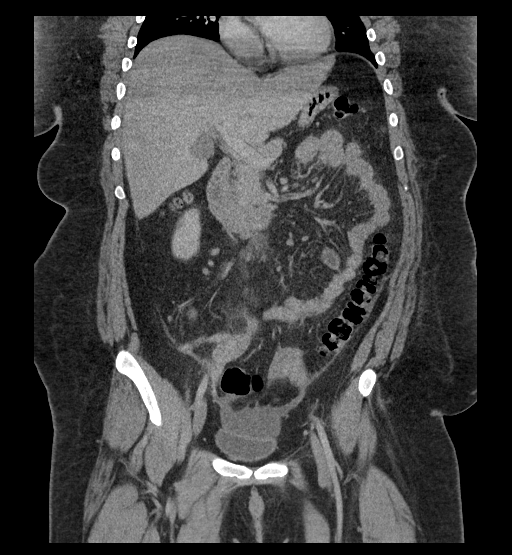
[im 91/164  soft-tissue]
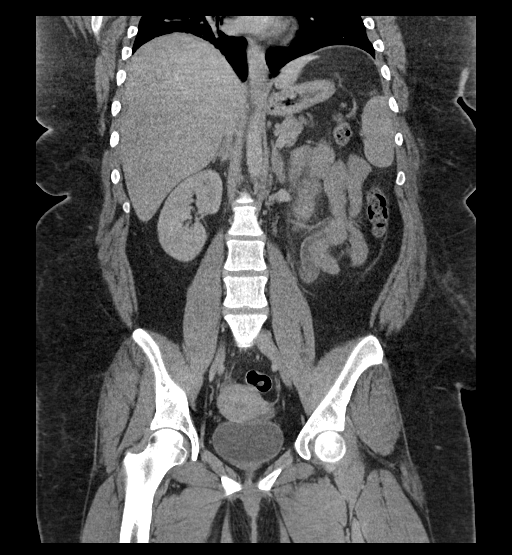

[16 of 46 positions shown; findings below may reference images not displayed]

FINDINGS: Lower chest: Minimal right basilar atelectasis.

Hepatobiliary: Liver is within normal limits.

Suspected layering gallbladder sludge and/or noncalcified gallstones
(series 2/image 32). No associated inflammatory changes. No
intrahepatic or extrahepatic ductal dilatation.

Pancreas: Within normal limits.

Spleen: Within normal limits.

Adrenals/Urinary Tract: Adrenal glands are within normal limits.

Kidneys are within normal limits. No renal, ureteral, or bladder
calculi. No hydronephrosis.

Bladder is within normal limits.

Stomach/Bowel: Stomach is within normal limits.

No evidence of bowel obstruction.

However, there is subtle inflammatory changes with mild mesenteric
stranding along loops of small bowel in the left mid abdomen (series
2/image 38). Additional mild mesenteric stranding with trace fluid
involving loops of small bowel and cecum in the right lower quadrant
(series 2/image 79). This appearance favors infectious/inflammatory
enteritis.

The appendix is mildly prominent but the appearance does not favor
acute appendicitis (coronal image 74).

No drainable fluid collection/abscess.

Vascular/Lymphatic: No evidence of abdominal aortic aneurysm.

No suspicious abdominopelvic lymphadenopathy.

Reproductive: Uterus is within normal limits.

Bilateral ovaries are within normal limits.

Other: Small volume pelvic ascites, mildly progressive, measuring
within the upper limits of normal for simple fluid density.

No free air.

Musculoskeletal: Visualized osseous structures are within normal
limits.
IMPRESSION: Findings most compatible with infectious/inflammatory enteritis,
possibly mildly progressive. No evidence of bowel obstruction.

Mildly prominent appendix without findings favoring acute
appendicitis.

Small volume pelvic ascites, mildly progressive. No drainable fluid
collection/abscess. No free air.

## 2020-09-27 IMAGING — US US ABDOMEN LIMITED
1 series · 14 of 25 positions shown · non-contrast
Comparison: None.

CLINICAL DATA: Patient with right upper quadrant abdominal pain.

EXAM:
ULTRASOUND ABDOMEN LIMITED RIGHT UPPER QUADRANT

[Series 1: us abdomen limited · 14 of 42 slices shown]
[im 1/42]
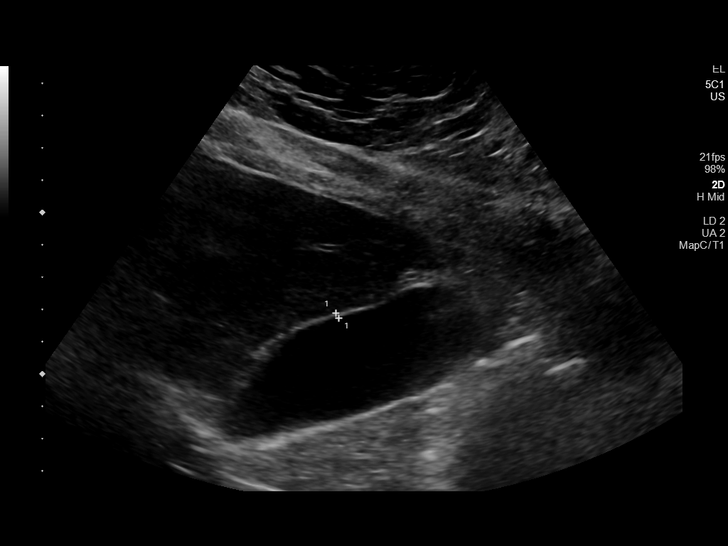
[im 4/42]
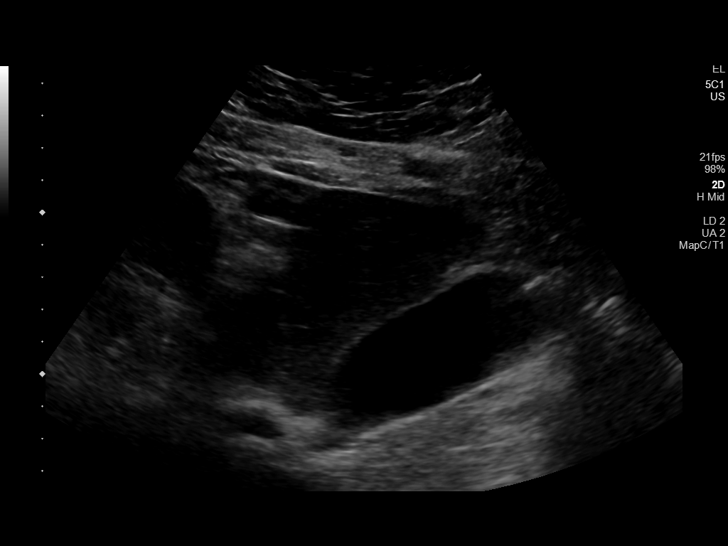
[im 7/42]
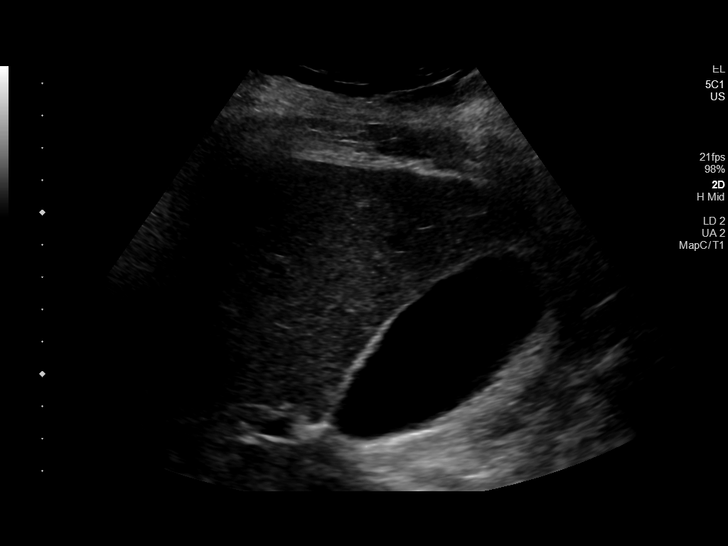
[im 11/42]
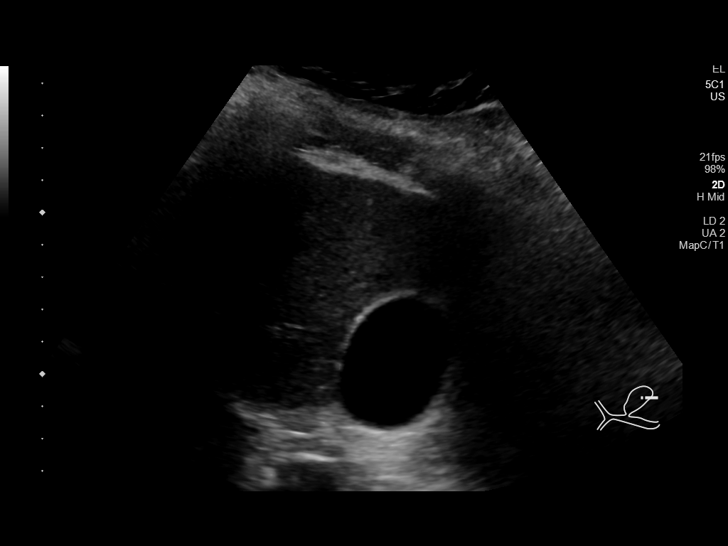
[im 14/42]
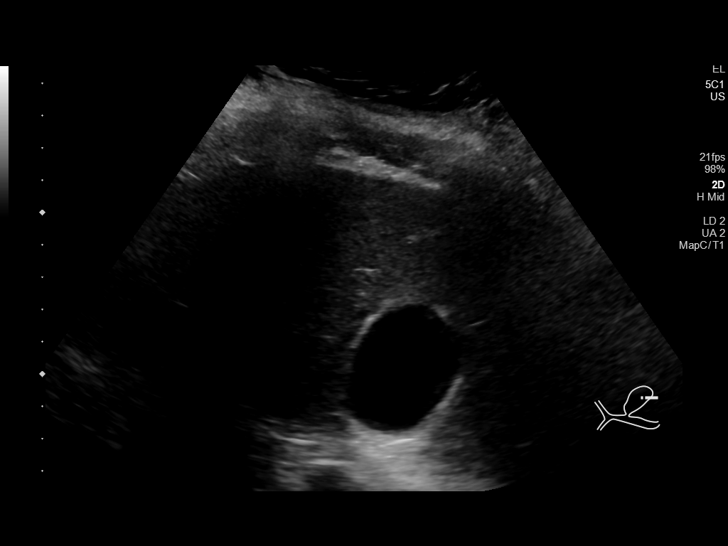
[im 16/42]
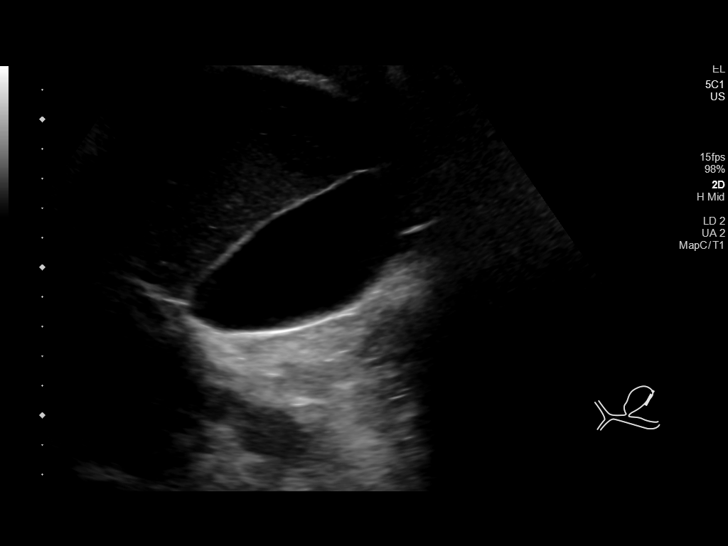
[im 19/42]
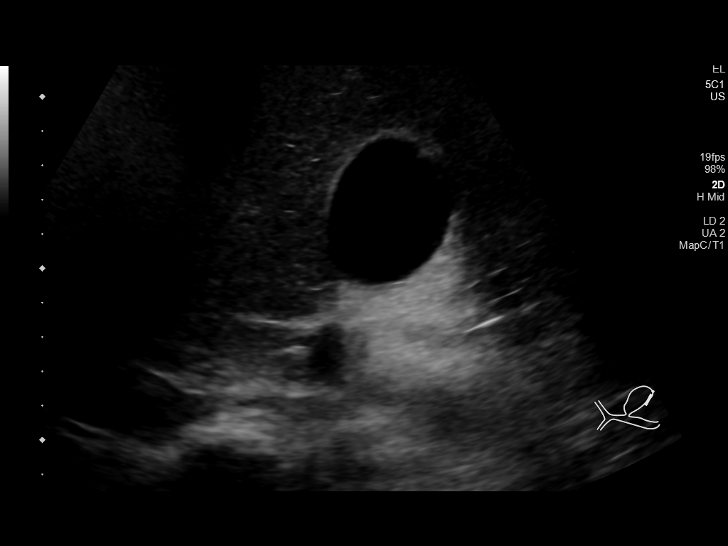
[im 23/42]
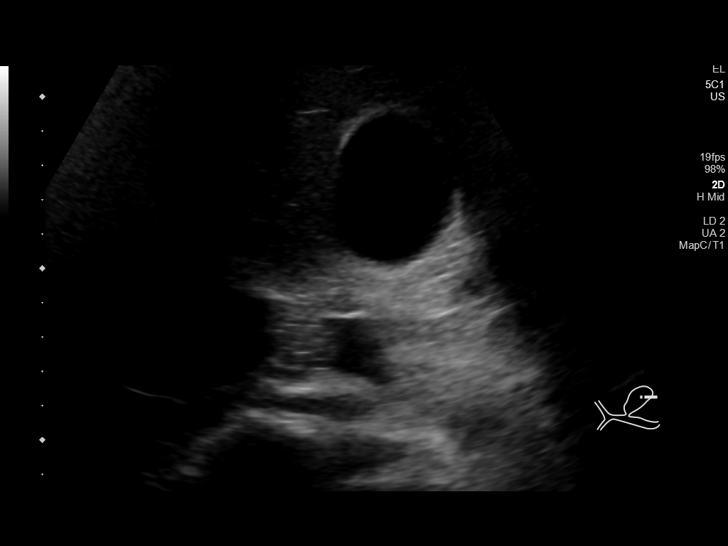
[im 26/42]
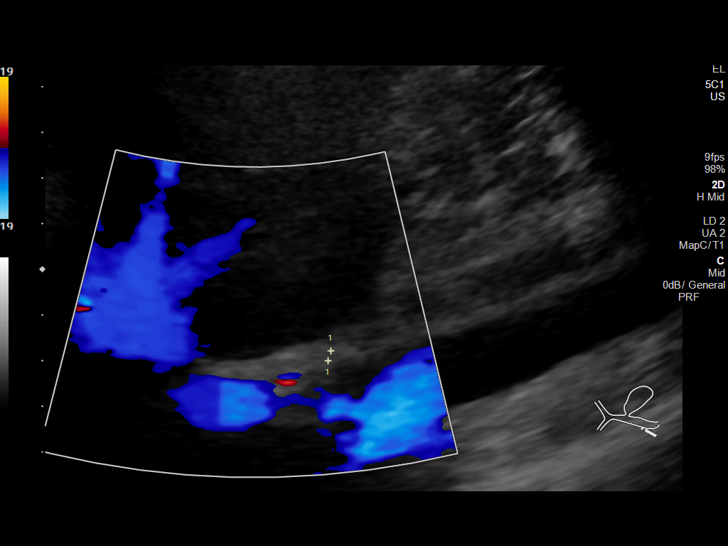
[im 28/42]
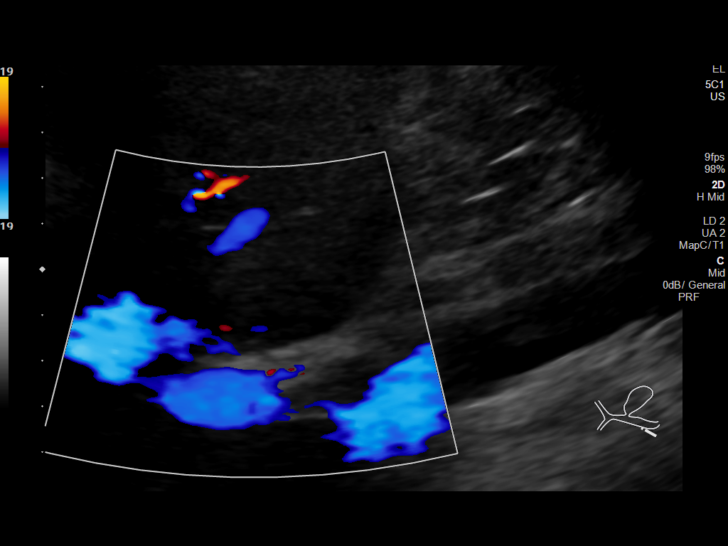
[im 31/42]
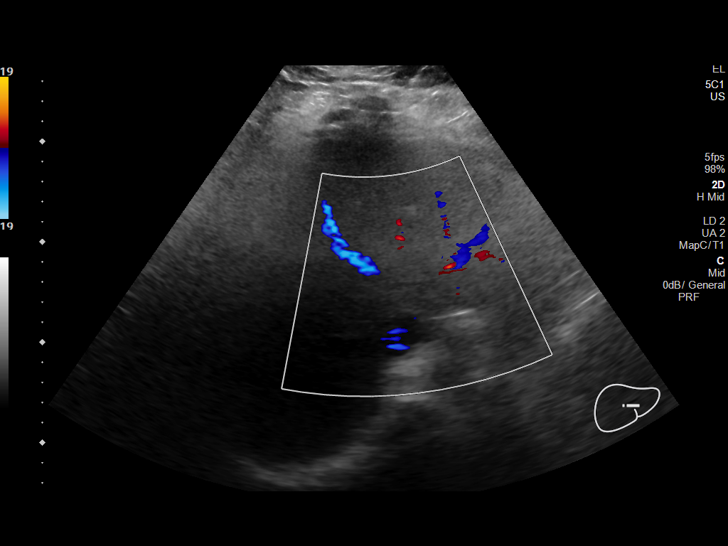
[im 35/42]
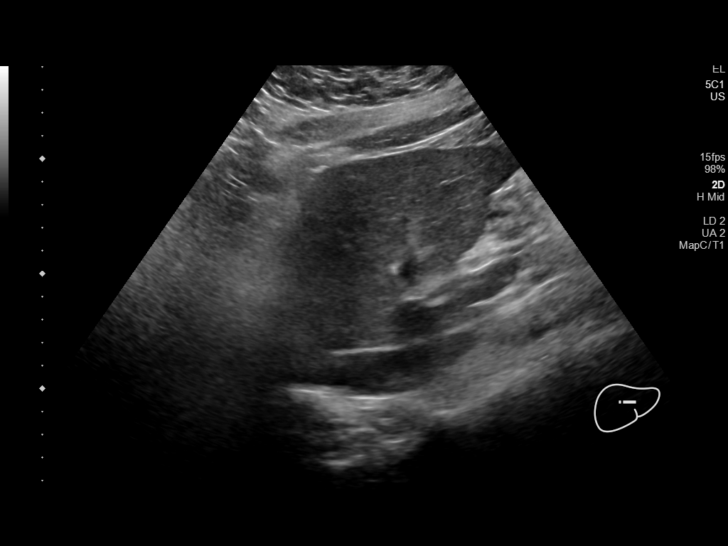
[im 38/42]
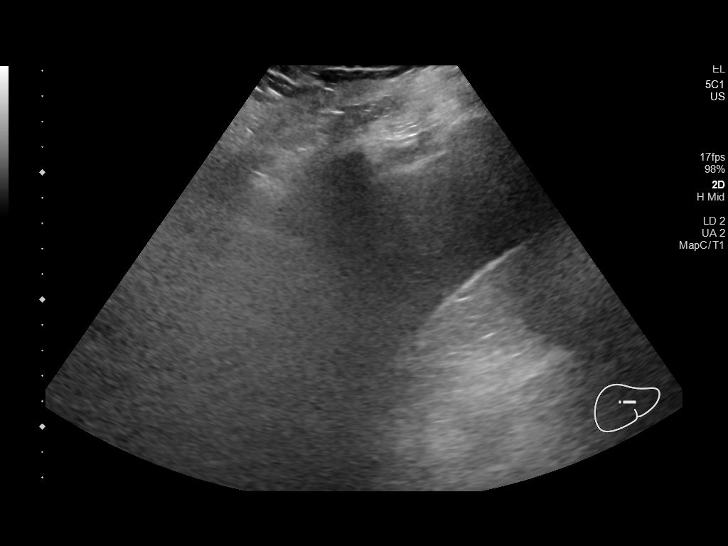
[im 42/42]
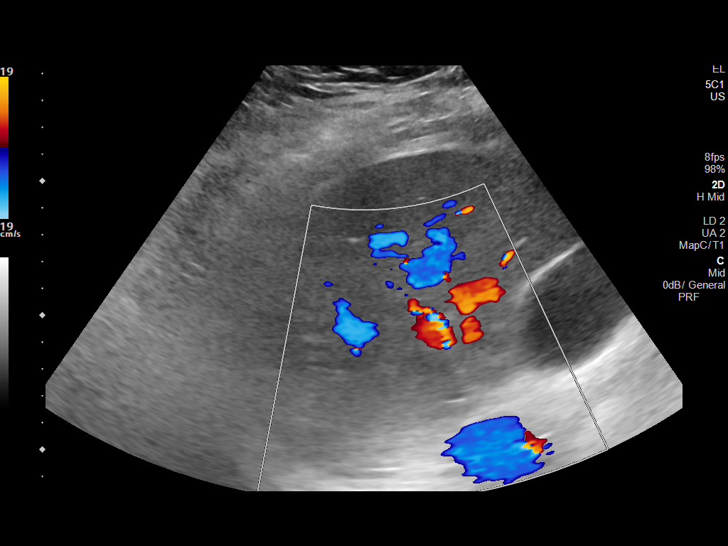

[14 of 25 positions shown; findings below may reference images not displayed]

FINDINGS: Gallbladder:

No gallstones or wall thickening visualized. No sonographic Murphy
sign noted by sonographer.

Common bile duct:

Diameter: 2 mm

Liver:

No focal lesion identified. Within normal limits in parenchymal
echogenicity. Portal vein is patent on color Doppler imaging with
normal direction of blood flow towards the liver.

Other: None.
IMPRESSION: No cholelithiasis or sonographic evidence for acute cholecystitis.

## 2020-10-02 DIAGNOSIS — Z794 Long term (current) use of insulin: Secondary | ICD-10-CM | POA: Diagnosis not present

## 2020-10-02 DIAGNOSIS — E109 Type 1 diabetes mellitus without complications: Secondary | ICD-10-CM | POA: Diagnosis not present

## 2020-10-09 ENCOUNTER — Other Ambulatory Visit: Payer: Self-pay

## 2020-10-09 ENCOUNTER — Ambulatory Visit (INDEPENDENT_AMBULATORY_CARE_PROVIDER_SITE_OTHER): Payer: 59 | Admitting: Family

## 2020-10-09 ENCOUNTER — Encounter (INDEPENDENT_AMBULATORY_CARE_PROVIDER_SITE_OTHER): Payer: Self-pay | Admitting: Family

## 2020-10-09 VITALS — BP 118/74 | HR 86 | Wt 315.6 lb

## 2020-10-09 DIAGNOSIS — N926 Irregular menstruation, unspecified: Secondary | ICD-10-CM | POA: Diagnosis not present

## 2020-10-09 DIAGNOSIS — Z794 Long term (current) use of insulin: Secondary | ICD-10-CM | POA: Diagnosis not present

## 2020-10-09 DIAGNOSIS — Z68.41 Body mass index (BMI) pediatric, greater than or equal to 95th percentile for age: Secondary | ICD-10-CM

## 2020-10-09 DIAGNOSIS — E1065 Type 1 diabetes mellitus with hyperglycemia: Secondary | ICD-10-CM | POA: Diagnosis not present

## 2020-10-09 LAB — POCT GLUCOSE (DEVICE FOR HOME USE): POC Glucose: 381 mg/dl — AB (ref 70–99)

## 2020-10-09 LAB — POCT URINALYSIS DIPSTICK: Ketones, UA: NEGATIVE

## 2020-10-09 LAB — POCT GLYCOSYLATED HEMOGLOBIN (HGB A1C): Hemoglobin A1C: 8.3 % — AB (ref 4.0–5.6)

## 2020-10-09 NOTE — Patient Instructions (Signed)
-   It was a pleasure seeing you in clinic today. Please do not hesitate to contact me if you have questions or concerns.   - Please sign up for MyChart. This is a communication tool that allows you to send an email directly to me. This can be used for questions, prescriptions and blood sugar reports. We will also release labs to you with instructions on MyChart. Please do not use MyChart if you need immediate or emergency assistance. Ask our wonderful front office staff if you need assistance.   - Decrease to 92 units of Tresiba.  - continue current settings.   - I will refer to Adult endocrine at [place]

## 2020-10-09 NOTE — Progress Notes (Signed)
Pediatric Endocrinology Diabetes Consultation Follow-up Visit  Lydia Estrada [birth year] [id number]  Chief Complaint: Follow-up Type 1 Diabetes   Masneri, Wille Celeste, DO   HPI: Lydia Estrada  is a 20 y.o. female presenting for follow-up of Type 1 Diabetes   she attended this visit alone.  1. Lydia Estrada was admitted to the PICU at [facility] on 03/29/17 for DKA and new-onset T1DM.  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  She was given several boluses of NS and started on an insulin infusion. She was then transferred to our PICU where she was also started on iv antibiotics for several draining skin lesions. Skin cultures subsequently grew out MRSA. Lydia Estrada was started on an omnipod dash pump on 09/29/17.   2. Since last visit to [facility] on 01/2020 , she has been well.    She reports that she is having difficulty with her "living situation". She I also struggling with anxiety. Has not been following up with therapist but reports she is taking Trazodone, Zoloft and Bupropion daily as prescribed by psych.   Has Omnipod insulin pump. She uses U200 insulin in it. She mainly uses her Omnipod for basal insulin but does bolus occasionally. She estimates she gives 9-15 units of Humalog U200 per meal, mainly with pen injections. Gives Tresiba 98 units daily. Wears Dexcom CGM consistently. She did not bring her Omnipod PDM today for review.   Take 1500 mg of Metformin XR daily.   Concerns:  - Stress and anxiety has made managing harder  - Low blood sugars overnight.    Insulin regimen: When using MDI:  Humalog U200 120/30/8 plan  Taking 98 units of Tresiba.  Metformin 1500 mg   When using Pump: U200 humalog  Tresiba: 100 units   Basal Rates 12am 1.5                      Insulin to  Carbohydrate Ratio 12AM 5  3pm 4             Insulin Sensitivity Factor 12AM 55  6am 35  9pm 50          Target Blood Glucose 12AM 80   6AM 80   10PM 80           Hypoglycemia: Not really having many lows, able to feel most when awake.  No pattern.  No glucagon needed. Pump download:  Did not bring to clinic   CGM download: Using dexcom G6   Med-alert ID: Wearing today. Injection/Pump sites: Abdomen, arms, legs, butt, back Annual labs due: ORdered today.  Ophthalmology due: 2022    Periods: Had secondary amenorrhea after dx of DM.  Had a period 10/2017, and then 01/2018.  Most recent period was awful, extremely heavy, lasted 8 days.  Interested in birth control for sexual activity and to help with menstrual cycles.  She does have a history of migraine headaches with sensitivity to light, improved some with ibuprofen, sometimes requires sleep.  Has more headaches when BGs are low and high, sometimes has spotty vision.  It is difficult to determine whether she has migraines with aura.  + family history of migraines.     ROS:  All systems reviewed with pertinent positives listed below; otherwise negative. Constitutional: Sleeping well. 12 lbs weight loss.  HEENT: Wears glasses as needed. No blurry vision.  Respiratory: No increased work of breathing currently Cardiac: no palpitations no tachycardia.  GI: No constipation or diarrhea GU: periods as above. On OCP.  Musculoskeletal: No joint deformity Neuro: Normal affect. No headaches.  Endocrine: As above  Past Medical History:   Past Medical History:  Diagnosis Date   Herpes    type 1 and 2   Type 1 diabetes mellitus (HCC)    Dx 03/2017, A1c 11%, presented in DKA. GAD antibodies markedly positive at 1493 (<5)    Medications:  Outpatient Encounter Medications as of 10/09/2020  Medication Sig Note   amphetamine-dextroamphetamine (ADDERALL XR) 20 MG 24 hr capsule Take 1 capsule (20 mg total) by mouth daily.     buPROPion (WELLBUTRIN XL) 150 MG 24 hr tablet TAKE 1 TABLET BY MOUTH EVERY MORNING    busPIRone (BUSPAR) 10 MG tablet Take 1 tablet (10 mg total) by mouth 3 (three) times daily.    insulin degludec (TRESIBA) 200 UNIT/ML FlexTouch Pen INJECT 100 UNITS UNDER THE SKIN DAILY AS DIRECTED    Insulin Human (INSULIN PUMP) SOLN Inject 1 each into the skin 4 (four) times daily -  before meals and at bedtime.    metFORMIN (GLUCOPHAGE-XR) 500 MG 24 hr tablet TAKE 3 TABLETS BY MOUTH AT BREAKFAST    sertraline (ZOLOFT) 100 MG tablet TAKE 1 AND 1/2 TABLETS BY MOUTH DAILY    traZODone (DESYREL) 100 MG tablet TAKE 1 TABLET BY MOUTH AT BEDTIME    amphetamine-dextroamphetamine (ADDERALL XR) 20 MG 24 hr capsule Take 1 capsule (20 mg total) by mouth every morning.    amphetamine-dextroamphetamine (ADDERALL XR) 20 MG 24 hr capsule Take 1 capsule (20 mg total) by mouth every morning. (Patient not taking: Reported on 10/09/2020)    [START ON 11/05/2020] amphetamine-dextroamphetamine (ADDERALL XR) 20 MG 24 hr capsule Take 1 capsule (20 mg total) by mouth every morning. (Patient not taking: Reported on 10/09/2020)    ELINEST 0.3-30 MG-MCG tablet Take 0.3 mg by mouth at bedtime. (Patient not taking: No sig reported)    fluconazole (DIFLUCAN) 150 MG tablet TAKE 1 TABLET BY MOUTH NOW, REPEAT IN 3 DAYS. (Patient not taking: Reported on 10/09/2020)    glucagon 1 MG injection Follow package directions for low blood sugar. 01/09/2019: Never used   insulin lispro (HUMALOG) 200 UNIT/ML KwikPen INJECT 150 UNITS INTO THE SKIN DAILY.    Insulin Pen Needle 32G X 4 MM MISC USE TO INJECT INSULIN 6 TIMES A DAY (Patient not taking: Reported on 10/09/2020)    metroNIDAZOLE (FLAGYL) 500 MG tablet TAKE 1 TABLET BY MOUTH 2 TIMES DAILY FOR 7 DAYS (Patient not taking: Reported on 10/09/2020)    nicotine (NICODERM CQ - DOSED IN MG/24 HOURS) 14 mg/24hr patch APPLY 1 PATCH ONTO THE SKIN (Patient not taking: Reported on 10/09/2020)    nicotine (NICODERM CQ - DOSED  IN MG/24 HOURS) 21 mg/24hr patch APPLY 1 PATCH ONTO THE SKIN ONCE DAILY (Patient not taking: Reported on 10/09/2020)    nicotine (NICODERM CQ - DOSED IN MG/24 HR) 7 mg/24hr patch APPLY 1 PATCH ONTO THE SKIN DAILY (Patient not taking: Reported on 10/09/2020)    nicotine polacrilex (NICORETTE) 4 MG gum USE AS DIRECTED UP TO 24 PIECES PER DAY. (Patient not taking: Reported on 10/09/2020)    nicotine polacrilex (NICORETTE) 4 MG gum Chew up to 24 pieces daily as directed (Patient not taking: Reported on 10/09/2020)    terconazole (TERAZOL 7) 0.4 % vaginal  cream INSERT 1 APPLICATORFUL EVERY DAY BY VAGINAL ROUTE AT BEDTIME FOR 7 DAYS. (Patient not taking: Reported on 10/09/2020)    [DISCONTINUED] lurasidone (LATUDA) 40 MG TABS tablet Take 1 tablet (40 mg total) by mouth daily with supper.    No facility-administered encounter medications on file as of 10/09/2020.    Allergies: Allergies  Allergen Reactions   Bee Venom Anaphylaxis   Grapefruit Concentrate    Ibuprofen     Throat swelling   Naproxen    Other     Seaweed, bee sting (anaphalytic)     Allergic to seaweed  Surgical History: Past Surgical History:  Procedure Laterality Date   WISDOM TOOTH EXTRACTION      Family History:  Family History  Problem Relation Age of Onset   Schizophrenia Sister    Bipolar disorder Brother    Diabetes Maternal Grandfather    Diabetes Maternal Grandmother    Has 12 siblings.    Social History: Lives with: mother, father and siblings (1of 13 children)   Physical Exam:  Vitals:   10/09/20 1323  BP: 118/74  Pulse: 86  Weight: (!) 315 lb 9.6 oz (143.2 kg)    BP 118/74 (BP Location: Right Arm, Patient Position: Sitting, Cuff Size: Normal)   Pulse 86   Wt (!) 315 lb 9.6 oz (143.2 kg)   BMI 48.00 kg/m  Body mass index: body mass index is 48 kg/m. Growth percentile SmartLinks can only be used for patients less than 62 years old.  Ht Readings from Last 3 Encounters:  05/02/20 5' 7.99" (1.727 m)   01/31/20 5\' 8"  (1.727 m) (93 %, Z= 1.45)*  05/10/19 5\' 8"  (1.727 m) (93 %, Z= 1.46)*   * Growth percentiles are based on CDC (Girls, 2-20 Years) data.   Wt Readings from Last 3 Encounters:  10/09/20 (!) 315 lb 9.6 oz (143.2 kg)  05/02/20 (!) 327 lb 12.8 oz (148.7 kg)  01/31/20 (!) 335 lb 9.6 oz (152.2 kg) (>99 %, Z= 3.04)*   * Growth percentiles are based on CDC (Girls, 2-20 Years) data.   General: obese female in no acute distress.  Head: Normocephalic, atraumatic.   Eyes:  Pupils equal and round. EOMI.   Sclera white.  No eye drainage.   Ears/Nose/Mouth/Throat: Nares patent, no nasal drainage.  Normal dentition, mucous membranes moist.   Neck: supple, no cervical lymphadenopathy, no thyromegaly Cardiovascular: regular rate, normal S1/S2, no murmurs Respiratory: No increased work of breathing.  Lungs clear to auscultation bilaterally.  No wheezes. Abdomen: soft, nontender, nondistended. Normal bowel sounds.  No appreciable masses  Extremities: warm, well perfused, cap refill < 2 sec.   Musculoskeletal: Normal muscle mass.  Normal strength Skin: warm, dry.  No rash or lesions.  Neurologic: alert and oriented, normal speech, no tremor    Labs: Last hemoglobin A1c: 8.6 % on 01/2020  Lab Results  Component Value Date   HGBA1C 8.3 (A) 10/09/2020     Assessment/Plan: LIBRA GATZ is a 20 y.o. female with  Uncontrolled T1DM on insulin pump, Tresiba and metformin therapy. She uses combination of Omnipod insulin pump with Humalog U200 and Tresiba long acting insulin. She is also using Humalog injections in addition to omnipod. Having pattern of hypoglycemia overnight, will reduce tresiba dose today. Her hemoglobin A1c is currently 8.3% which is higher then ADA goal of <7.5%. Encouraged to contact psych for follow up.  .   1. Uncontrolled diabetes mellitus type 1 without complications (HCC) - Metformin XR 1500  mg.  - Reviewed insulin pump and CGM download. Discussed trends and  patterns.  - Rotate pump sites to prevent scar tissue.  - bolus 15 minutes prior to eating to limit blood sugar spikes.  - Reviewed carb counting and importance of accurate carb counting.  - Discussed signs and symptoms of hypoglycemia. Always have glucose available.  - POCT glucose and hemoglobin A1c  - Reviewed growth chart.  - Discussed hypoglycemia and rebound hyperglycemia.  - Will refer to adult endocrinology due to age  - Lipid panel, TFT, microalbumin ordered  2. Insulin  titration - Reduce tresiba 92 units per day  - Unable to titrate insulin pump setting today due to not bringing PDM.   3. Obesity  - Encouraged at least 30 minutes of activity per day  - Reviewed diet and discussed importance of healthy dietary intake.   4. Irregular periods -Followed by adolescent med.   5. Adjustment Reaction /bipolar disorder  - close follow up with psych.   Follow-up:  3 months.    Medical decision-making:  >45 spent today reviewing the medical chart, counseling the patient/family, and documenting today's visit.    When a patient is on insulin, intensive monitoring of blood glucose levels is necessary to avoid hyperglycemia and hypoglycemia. Severe hyperglycemia/hypoglycemia can lead to hospital admissions and be life threatening.    Gretchen ShortSpenser Katelynne Revak,  FNP-C  Pediatric Specialist  [address]  [place], [id number]  Tele: [phone]

## 2020-10-10 LAB — TSH: TSH: 1.19 mIU/L

## 2020-10-10 LAB — LIPID PANEL
Cholesterol: 230 mg/dL — ABNORMAL HIGH (ref <200)
HDL: 51 mg/dL (ref >=50)
LDL Cholesterol (Calc): 139 mg/dL (calc) — ABNORMAL HIGH
Non-HDL Cholesterol (Calc): 179 mg/dL (calc) — ABNORMAL HIGH (ref <130)
Total CHOL/HDL Ratio: 4.5 (calc) (ref <5.0)
Triglycerides: 259 mg/dL — ABNORMAL HIGH (ref <150)

## 2020-10-10 LAB — MICROALBUMIN / CREATININE URINE RATIO
Creatinine, Urine: 42 mg/dL (ref 20–275)
Microalb Creat Ratio: 7 mcg/mg creat (ref <30)
Microalb, Ur: 0.3 mg/dL

## 2020-10-10 LAB — T4, FREE: Free T4: 1.1 ng/dL (ref 0.8–1.4)

## 2020-10-22 ENCOUNTER — Other Ambulatory Visit (HOSPITAL_COMMUNITY): Payer: Self-pay

## 2020-10-22 ENCOUNTER — Other Ambulatory Visit (INDEPENDENT_AMBULATORY_CARE_PROVIDER_SITE_OTHER): Payer: Self-pay

## 2020-10-22 ENCOUNTER — Other Ambulatory Visit: Payer: Self-pay | Admitting: Psychiatry

## 2020-10-22 ENCOUNTER — Other Ambulatory Visit (INDEPENDENT_AMBULATORY_CARE_PROVIDER_SITE_OTHER): Payer: Self-pay | Admitting: Pediatrics

## 2020-10-22 ENCOUNTER — Other Ambulatory Visit (INDEPENDENT_AMBULATORY_CARE_PROVIDER_SITE_OTHER): Payer: Self-pay | Admitting: Family

## 2020-10-22 ENCOUNTER — Other Ambulatory Visit (HOSPITAL_COMMUNITY): Payer: Self-pay | Admitting: Pharmacist

## 2020-10-22 DIAGNOSIS — E109 Type 1 diabetes mellitus without complications: Secondary | ICD-10-CM

## 2020-10-22 DIAGNOSIS — E1065 Type 1 diabetes mellitus with hyperglycemia: Secondary | ICD-10-CM

## 2020-10-22 MED ORDER — TRESIBA FLEXTOUCH 200 UNIT/ML ~~LOC~~ SOPN
PEN_INJECTOR | SUBCUTANEOUS | 6 refills | Status: DC
  Filled 2020-10-22: qty 18, 36d supply, fill #0
  Filled 2020-12-20: qty 18, 36d supply, fill #1
  Filled 2021-02-11: qty 18, 36d supply, fill #2
  Filled 2021-03-19: qty 18, 36d supply, fill #3
  Filled 2021-03-19: qty 15, 30d supply, fill #3
  Filled 2021-04-17: qty 15, 30d supply, fill #4
  Filled 2021-05-22: qty 15, 30d supply, fill #5
  Filled 2021-06-17: qty 15, 30d supply, fill #6

## 2020-10-22 MED ORDER — HUMALOG KWIKPEN 200 UNIT/ML ~~LOC~~ SOPN
PEN_INJECTOR | SUBCUTANEOUS | 6 refills | Status: DC
  Filled 2020-10-22: qty 12, 28d supply, fill #0
  Filled 2020-11-24: qty 12, 28d supply, fill #1
  Filled 2020-12-20: qty 12, 28d supply, fill #2
  Filled 2021-02-11: qty 12, 28d supply, fill #3
  Filled 2021-03-19: qty 12, 28d supply, fill #4
  Filled 2021-04-17: qty 12, 28d supply, fill #5
  Filled 2021-05-11: qty 12, 28d supply, fill #6
  Filled 2021-05-22 – 2021-06-17 (×2): qty 12, 28d supply, fill #7

## 2020-10-22 MED ORDER — GLUCAGON (RDNA) 1 MG IJ KIT
PACK | INTRAMUSCULAR | 6 refills | Status: DC
  Filled 2020-10-22: qty 2, 2d supply, fill #0
  Filled 2021-05-22: qty 2, 2d supply, fill #1

## 2020-10-22 MED ORDER — UNIFINE PENTIPS 32G X 4 MM MISC
1 refills | Status: DC
  Filled 2020-10-22: qty 600, 90d supply, fill #0
  Filled 2021-02-11: qty 600, 90d supply, fill #1

## 2020-10-22 MED ORDER — METFORMIN HCL ER 500 MG PO TB24
ORAL_TABLET | ORAL | 1 refills | Status: DC
  Filled 2020-10-22: qty 270, 90d supply, fill #0
  Filled 2021-02-11: qty 270, 90d supply, fill #1

## 2020-10-23 ENCOUNTER — Other Ambulatory Visit (HOSPITAL_COMMUNITY): Payer: Self-pay

## 2020-10-24 ENCOUNTER — Other Ambulatory Visit (HOSPITAL_COMMUNITY): Payer: Self-pay
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
## 2020-10-25 ENCOUNTER — Telehealth (INDEPENDENT_AMBULATORY_CARE_PROVIDER_SITE_OTHER): Payer: 59 | Admitting: Psychiatry

## 2020-10-25 ENCOUNTER — Encounter: Payer: Self-pay | Admitting: Psychiatry

## 2020-10-25 ENCOUNTER — Other Ambulatory Visit (HOSPITAL_COMMUNITY): Payer: Self-pay

## 2020-10-25 ENCOUNTER — Other Ambulatory Visit: Payer: Self-pay

## 2020-10-25 DIAGNOSIS — F4310 Post-traumatic stress disorder, unspecified: Secondary | ICD-10-CM | POA: Diagnosis not present

## 2020-10-25 DIAGNOSIS — F330 Major depressive disorder, recurrent, mild: Secondary | ICD-10-CM | POA: Diagnosis not present

## 2020-10-25 MED ORDER — BUPROPION HCL ER (XL) 150 MG PO TB24
150.0000 mg | ORAL_TABLET | Freq: Every day | ORAL | 0 refills | Status: DC
  Filled 2020-10-25 – 2021-02-11 (×2): qty 30, 30d supply, fill #0

## 2020-10-25 MED ORDER — PRAZOSIN HCL 1 MG PO CAPS
1.0000 mg | ORAL_CAPSULE | Freq: Every day | ORAL | 0 refills | Status: DC
  Filled 2020-10-25: qty 3, 3d supply, fill #0

## 2020-10-25 MED ORDER — BUSPIRONE HCL 10 MG PO TABS
10.0000 mg | ORAL_TABLET | Freq: Three times a day (TID) | ORAL | 2 refills | Status: DC
  Filled 2020-10-25 – 2021-02-11 (×2): qty 90, 30d supply, fill #0
  Filled 2021-03-19: qty 90, 30d supply, fill #1
  Filled 2021-04-17: qty 90, 30d supply, fill #2

## 2020-10-25 MED ORDER — AMPHETAMINE-DEXTROAMPHET ER 20 MG PO CP24: 20.0000 mg | ORAL_CAPSULE | ORAL | 0 refills | Status: DC

## 2020-10-25 MED ORDER — PRAZOSIN HCL 2 MG PO CAPS
2.0000 mg | ORAL_CAPSULE | Freq: Every day | ORAL | 1 refills | Status: DC
  Filled 2020-10-25: qty 30, 30d supply, fill #0
  Filled 2020-12-20: qty 30, 30d supply, fill #1

## 2020-10-25 MED ORDER — SERTRALINE HCL 100 MG PO TABS
150.0000 mg | ORAL_TABLET | Freq: Every day | ORAL | 2 refills | Status: DC
  Filled 2020-10-25 – 2020-11-24 (×2): qty 45, 30d supply, fill #0
  Filled 2020-12-20: qty 45, 30d supply, fill #1
  Filled 2021-02-11: qty 45, 30d supply, fill #2

## 2020-10-25 NOTE — Patient Instructions (Signed)
Continue sertraline 150 mg daily Continue bupropion 150 mg Continue Buspar 10 mg three times a day Continue Adderall 20 mg daily, pending neuropsychiatry evaluation Start prazosin 1 mg at night for 3 days, then 2 mg at night  Referral to neuropsych evaluation for ADHD Next appointment- 10/20 at 2:30

## 2020-11-02 DIAGNOSIS — E109 Type 1 diabetes mellitus without complications: Secondary | ICD-10-CM | POA: Diagnosis not present

## 2020-11-02 DIAGNOSIS — Z794 Long term (current) use of insulin: Secondary | ICD-10-CM | POA: Diagnosis not present

## 2020-11-13 DIAGNOSIS — E1065 Type 1 diabetes mellitus with hyperglycemia: Secondary | ICD-10-CM | POA: Diagnosis not present

## 2020-11-13 DIAGNOSIS — E10649 Type 1 diabetes mellitus with hypoglycemia without coma: Secondary | ICD-10-CM | POA: Diagnosis not present

## 2020-11-22 DIAGNOSIS — H5213 Myopia, bilateral: Secondary | ICD-10-CM | POA: Diagnosis not present

## 2020-11-24 ENCOUNTER — Other Ambulatory Visit (HOSPITAL_COMMUNITY): Payer: Self-pay

## 2020-12-02 DIAGNOSIS — E109 Type 1 diabetes mellitus without complications: Secondary | ICD-10-CM | POA: Diagnosis not present

## 2020-12-02 DIAGNOSIS — Z794 Long term (current) use of insulin: Secondary | ICD-10-CM | POA: Diagnosis not present

## 2020-12-05 ENCOUNTER — Encounter: Payer: 59 | Attending: Psychology | Admitting: Psychology

## 2020-12-05 ENCOUNTER — Encounter: Payer: Self-pay | Admitting: Psychology

## 2020-12-05 ENCOUNTER — Other Ambulatory Visit: Payer: Self-pay

## 2020-12-05 DIAGNOSIS — E109 Type 1 diabetes mellitus without complications: Secondary | ICD-10-CM | POA: Insufficient documentation

## 2020-12-05 DIAGNOSIS — F410 Panic disorder [episodic paroxysmal anxiety] without agoraphobia: Secondary | ICD-10-CM | POA: Diagnosis not present

## 2020-12-05 DIAGNOSIS — R41840 Attention and concentration deficit: Secondary | ICD-10-CM | POA: Insufficient documentation

## 2020-12-05 DIAGNOSIS — F3341 Major depressive disorder, recurrent, in partial remission: Secondary | ICD-10-CM | POA: Insufficient documentation
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
## 2020-12-20 ENCOUNTER — Telehealth (INDEPENDENT_AMBULATORY_CARE_PROVIDER_SITE_OTHER): Payer: 59 | Admitting: Psychiatry

## 2020-12-20 ENCOUNTER — Encounter: Payer: Self-pay | Admitting: Psychiatry

## 2020-12-20 ENCOUNTER — Other Ambulatory Visit: Payer: Self-pay | Admitting: Psychiatry

## 2020-12-20 ENCOUNTER — Other Ambulatory Visit: Payer: Self-pay

## 2020-12-20 ENCOUNTER — Other Ambulatory Visit (HOSPITAL_COMMUNITY): Payer: Self-pay

## 2020-12-20 DIAGNOSIS — F3341 Major depressive disorder, recurrent, in partial remission: Secondary | ICD-10-CM

## 2020-12-20 DIAGNOSIS — F4310 Post-traumatic stress disorder, unspecified: Secondary | ICD-10-CM | POA: Diagnosis not present

## 2020-12-20 DIAGNOSIS — R41840 Attention and concentration deficit: Secondary | ICD-10-CM

## 2020-12-20 NOTE — Patient Instructions (Signed)
Continue sertraline 150 mg daily Continue bupropion 150 mg Continue Buspar 10 mg three times a day Continue Adderall 20 mg daily Continue prazosin 2 mg at night   Next appointment- 1/19 at 1:20

## 2020-12-25 ENCOUNTER — Other Ambulatory Visit (HOSPITAL_COMMUNITY): Payer: Self-pay

## 2021-01-02 DIAGNOSIS — E109 Type 1 diabetes mellitus without complications: Secondary | ICD-10-CM | POA: Diagnosis not present

## 2021-01-02 DIAGNOSIS — Z794 Long term (current) use of insulin: Secondary | ICD-10-CM | POA: Diagnosis not present

## 2021-02-04 DIAGNOSIS — M25531 Pain in right wrist: Secondary | ICD-10-CM | POA: Diagnosis not present

## 2021-02-05 ENCOUNTER — Encounter: Payer: 59 | Attending: Psychology

## 2021-02-05 ENCOUNTER — Other Ambulatory Visit: Payer: Self-pay

## 2021-02-05 DIAGNOSIS — R41840 Attention and concentration deficit: Secondary | ICD-10-CM | POA: Insufficient documentation

## 2021-02-05 NOTE — Progress Notes (Signed)
   Behavioral Observation The patient appeared well-groomed and appropriately dressed. Her manners were polite and appropriate to the situation. She demonstrated a positive attitude toward testing and showed good effort. She was compliant with all testing instructions.  Neuropsychology Note  Lydia Estrada completed 60 minutes of neuropsychological testing with technician, Marica Otter, BA, under the supervision of Arley Phenix, PsyD., Clinical Neuropsychologist. The patient did not appear overtly distressed by the testing session, per behavioral observation or via self-report to the technician. Rest breaks were offered.   Clinical Decision Making: In considering the patient's current level of functioning, level of presumed impairment, nature of symptoms, emotional and behavioral responses during clinical interview, level of literacy, and observed level of motivation/effort, a battery of tests was selected by Dr. Kieth Brightly during initial consultation on 12/05/2020. This was communicated to the technician. Communication between the neuropsychologist and technician was ongoing throughout the testing session and changes were made as deemed necessary based on patient performance on testing, technician observations and additional pertinent factors such as those listed above.  Tests Administered: Comprehensive Attention Battery (CAB) Continuous Performance Test (CPT)  Results: Will be included in final report   Feedback to Patient: Lydia Estrada will return on 06/10/2021 or sooner for an interactive feedback session with Dr. Kieth Brightly at which time her test performances, clinical impressions and treatment recommendations will be reviewed in detail. The patient understands she can contact our office should she require our assistance before this time.  60 minutes spent face-to-face with patient administering standardized tests, 30 minutes spent scoring Radiographer, therapeutic). [CPT [id number], [id number]]  Full  report to follow.

## 2021-02-08 DIAGNOSIS — E1065 Type 1 diabetes mellitus with hyperglycemia: Secondary | ICD-10-CM | POA: Diagnosis not present

## 2021-02-08 DIAGNOSIS — E10649 Type 1 diabetes mellitus with hypoglycemia without coma: Secondary | ICD-10-CM | POA: Diagnosis not present

## 2021-02-11 ENCOUNTER — Other Ambulatory Visit (HOSPITAL_COMMUNITY): Payer: Self-pay

## 2021-02-11 ENCOUNTER — Other Ambulatory Visit: Payer: Self-pay | Admitting: Psychiatry

## 2021-02-11 MED ORDER — PRAZOSIN HCL 2 MG PO CAPS
2.0000 mg | ORAL_CAPSULE | Freq: Every day | ORAL | 1 refills | Status: DC
  Filled 2021-02-11: qty 30, 30d supply, fill #0
  Filled 2021-03-19: qty 30, 30d supply, fill #1

## 2021-02-12 ENCOUNTER — Other Ambulatory Visit (HOSPITAL_COMMUNITY): Payer: Self-pay

## 2021-02-21 ENCOUNTER — Ambulatory Visit: Payer: 59 | Admitting: Psychology

## 2021-02-21 DIAGNOSIS — M25531 Pain in right wrist: Secondary | ICD-10-CM | POA: Diagnosis not present

## 2021-02-21 DIAGNOSIS — M25821 Other specified joint disorders, right elbow: Secondary | ICD-10-CM | POA: Diagnosis not present

## 2021-03-08 DIAGNOSIS — E109 Type 1 diabetes mellitus without complications: Secondary | ICD-10-CM | POA: Diagnosis not present

## 2021-03-08 DIAGNOSIS — Z794 Long term (current) use of insulin: Secondary | ICD-10-CM | POA: Diagnosis not present

## 2021-03-19 ENCOUNTER — Other Ambulatory Visit (HOSPITAL_COMMUNITY): Payer: Self-pay

## 2021-03-19 ENCOUNTER — Other Ambulatory Visit: Payer: Self-pay | Admitting: Psychiatry

## 2021-03-19 DIAGNOSIS — F330 Major depressive disorder, recurrent, mild: Secondary | ICD-10-CM

## 2021-03-19 MED ORDER — BUPROPION HCL ER (XL) 150 MG PO TB24
150.0000 mg | ORAL_TABLET | Freq: Every day | ORAL | 0 refills | Status: DC
  Filled 2021-03-19: qty 30, 30d supply, fill #0

## 2021-03-19 MED ORDER — SERTRALINE HCL 100 MG PO TABS
150.0000 mg | ORAL_TABLET | Freq: Every day | ORAL | 2 refills | Status: DC
  Filled 2021-03-19: qty 45, 30d supply, fill #0
  Filled 2021-04-17: qty 45, 30d supply, fill #1
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
## 2021-03-21 ENCOUNTER — Other Ambulatory Visit (HOSPITAL_COMMUNITY): Payer: Self-pay

## 2021-03-21 ENCOUNTER — Encounter: Payer: Self-pay | Admitting: Psychiatry

## 2021-03-21 ENCOUNTER — Other Ambulatory Visit: Payer: Self-pay

## 2021-03-21 ENCOUNTER — Encounter (INDEPENDENT_AMBULATORY_CARE_PROVIDER_SITE_OTHER): Payer: Self-pay

## 2021-03-21 ENCOUNTER — Telehealth (INDEPENDENT_AMBULATORY_CARE_PROVIDER_SITE_OTHER): Payer: 59 | Admitting: Psychiatry

## 2021-03-21 DIAGNOSIS — F4310 Post-traumatic stress disorder, unspecified: Secondary | ICD-10-CM

## 2021-03-21 DIAGNOSIS — F900 Attention-deficit hyperactivity disorder, predominantly inattentive type: Secondary | ICD-10-CM

## 2021-03-21 DIAGNOSIS — F330 Major depressive disorder, recurrent, mild: Secondary | ICD-10-CM

## 2021-03-21 MED ORDER — SERTRALINE HCL 100 MG PO TABS
200.0000 mg | ORAL_TABLET | Freq: Every day | ORAL | 0 refills | Status: DC
  Filled 2021-03-21 – 2021-05-22 (×2): qty 60, 30d supply, fill #0

## 2021-03-21 MED ORDER — AMPHETAMINE-DEXTROAMPHET ER 20 MG PO CP24
20.0000 mg | ORAL_CAPSULE | ORAL | 0 refills | Status: DC
  Filled 2021-03-21: qty 30, 30d supply, fill #0

## 2021-03-21 MED ORDER — BUPROPION HCL ER (XL) 150 MG PO TB24
150.0000 mg | ORAL_TABLET | Freq: Every day | ORAL | 0 refills | Status: DC
  Filled 2021-03-21 – 2021-04-17 (×2): qty 30, 30d supply, fill #0

## 2021-03-21 NOTE — Patient Instructions (Signed)
Continue sertraline 150 mg daily Continue bupropion 150 mg Continue Buspar 10 mg three times a day Continue Adderall 20 mg daily, pending neuropsychiatry evaluation Continue prazosin 2 mg at night  Next appointment- 2/16 at 10:30

## 2021-03-22 ENCOUNTER — Other Ambulatory Visit (INDEPENDENT_AMBULATORY_CARE_PROVIDER_SITE_OTHER): Payer: Self-pay | Admitting: Family

## 2021-03-22 ENCOUNTER — Telehealth: Payer: Self-pay

## 2021-03-22 DIAGNOSIS — E1065 Type 1 diabetes mellitus with hyperglycemia: Secondary | ICD-10-CM

## 2021-03-22 NOTE — Telephone Encounter (Signed)
Noted, thanks!

## 2021-03-22 NOTE — Telephone Encounter (Signed)
pt called left message that she wanted to have someone speak on her behave because it is difficult for her to speak about some things.

## 2021-03-22 NOTE — Telephone Encounter (Signed)
left message that a ROI would be mailed and emailed and to call our office back.

## 2021-03-28 DIAGNOSIS — F329 Major depressive disorder, single episode, unspecified: Secondary | ICD-10-CM | POA: Diagnosis not present

## 2021-04-07 DIAGNOSIS — Z794 Long term (current) use of insulin: Secondary | ICD-10-CM | POA: Diagnosis not present

## 2021-04-07 DIAGNOSIS — E109 Type 1 diabetes mellitus without complications: Secondary | ICD-10-CM | POA: Diagnosis not present
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
## 2021-04-17 ENCOUNTER — Other Ambulatory Visit (HOSPITAL_COMMUNITY): Payer: Self-pay

## 2021-04-17 ENCOUNTER — Other Ambulatory Visit: Payer: Self-pay | Admitting: Psychiatry

## 2021-04-18 ENCOUNTER — Telehealth (INDEPENDENT_AMBULATORY_CARE_PROVIDER_SITE_OTHER): Payer: 59 | Admitting: Psychiatry

## 2021-04-18 ENCOUNTER — Other Ambulatory Visit (HOSPITAL_COMMUNITY): Payer: Self-pay

## 2021-04-18 ENCOUNTER — Encounter: Payer: Self-pay | Admitting: Psychiatry

## 2021-04-18 ENCOUNTER — Other Ambulatory Visit: Payer: Self-pay

## 2021-04-18 DIAGNOSIS — F1290 Cannabis use, unspecified, uncomplicated: Secondary | ICD-10-CM | POA: Diagnosis not present

## 2021-04-18 DIAGNOSIS — F332 Major depressive disorder, recurrent severe without psychotic features: Secondary | ICD-10-CM | POA: Diagnosis not present

## 2021-04-18 DIAGNOSIS — F900 Attention-deficit hyperactivity disorder, predominantly inattentive type: Secondary | ICD-10-CM

## 2021-04-18 DIAGNOSIS — F4310 Post-traumatic stress disorder, unspecified: Secondary | ICD-10-CM | POA: Diagnosis not present

## 2021-04-18 MED ORDER — PRAZOSIN HCL 2 MG PO CAPS
2.0000 mg | ORAL_CAPSULE | Freq: Every day | ORAL | 0 refills | Status: DC
  Filled 2021-04-18: qty 30, 30d supply, fill #0

## 2021-04-18 MED ORDER — BUPROPION HCL ER (XL) 300 MG PO TB24
300.0000 mg | ORAL_TABLET | Freq: Every day | ORAL | 0 refills | Status: DC
  Filled 2021-04-18: qty 30, 30d supply, fill #0

## 2021-04-18 MED ORDER — AMPHETAMINE-DEXTROAMPHET ER 20 MG PO CP24
20.0000 mg | ORAL_CAPSULE | ORAL | 0 refills | Status: DC
  Filled 2021-04-21: qty 30, 30d supply, fill #0

## 2021-04-18 NOTE — Patient Instructions (Signed)
Decrease sertraline 150 mg daily Increase bupropion 300 mg daily Continue Buspar 10 mg three times a day Continue Adderall 20 mg daily Continue prazosin 2 mg at night  Next appointment- 2/16 at 10:30

## 2021-04-19 ENCOUNTER — Ambulatory Visit: Payer: 59 | Admitting: Internal Medicine

## 2021-04-22 ENCOUNTER — Other Ambulatory Visit (HOSPITAL_COMMUNITY): Payer: Self-pay

## 2021-04-23 ENCOUNTER — Ambulatory Visit: Payer: 59 | Admitting: Internal Medicine

## 2021-04-24 ENCOUNTER — Other Ambulatory Visit: Payer: Self-pay

## 2021-04-24 ENCOUNTER — Other Ambulatory Visit (HOSPITAL_COMMUNITY): Payer: Self-pay

## 2021-04-24 ENCOUNTER — Encounter: Payer: 59 | Attending: Psychology | Admitting: Psychology

## 2021-04-24 DIAGNOSIS — F410 Panic disorder [episodic paroxysmal anxiety] without agoraphobia: Secondary | ICD-10-CM | POA: Diagnosis not present

## 2021-04-24 DIAGNOSIS — F3341 Major depressive disorder, recurrent, in partial remission: Secondary | ICD-10-CM | POA: Diagnosis not present

## 2021-04-24 DIAGNOSIS — R41840 Attention and concentration deficit: Secondary | ICD-10-CM

## 2021-04-24 DIAGNOSIS — F4310 Post-traumatic stress disorder, unspecified: Secondary | ICD-10-CM | POA: Diagnosis not present
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
## 2021-04-29 ENCOUNTER — Telehealth: Payer: Self-pay | Admitting: Family

## 2021-04-29 NOTE — Telephone Encounter (Signed)
°  Who's calling (name and relationship to patient) :Toni Amend with [facility]  Best contact [phone]  Provider they QJJ:HERDEYC Dalbert Garnet   Reason for call:Needs more notes, labs and demo with insurance card faxed over for the referral that was placed.    Fax [phone]  PRESCRIPTION REFILL ONLY  Name of prescription:  Pharmacy:

## 2021-04-29 NOTE — Telephone Encounter (Signed)
faxed

## 2021-05-06 ENCOUNTER — Encounter (INDEPENDENT_AMBULATORY_CARE_PROVIDER_SITE_OTHER): Payer: Self-pay

## 2021-05-07 ENCOUNTER — Inpatient Hospital Stay (HOSPITAL_COMMUNITY): Admission: RE | Admit: 2021-05-07 | Payer: 59 | Source: Home / Self Care | Admitting: Obstetrics and Gynecology

## 2021-05-07 DIAGNOSIS — Z794 Long term (current) use of insulin: Secondary | ICD-10-CM | POA: Diagnosis not present

## 2021-05-07 DIAGNOSIS — E109 Type 1 diabetes mellitus without complications: Secondary | ICD-10-CM | POA: Diagnosis not present

## 2021-05-08 DIAGNOSIS — E10649 Type 1 diabetes mellitus with hypoglycemia without coma: Secondary | ICD-10-CM | POA: Diagnosis not present

## 2021-05-08 DIAGNOSIS — E1065 Type 1 diabetes mellitus with hyperglycemia: Secondary | ICD-10-CM | POA: Diagnosis not present

## 2021-05-11 ENCOUNTER — Other Ambulatory Visit: Payer: Self-pay | Admitting: Psychiatry

## 2021-05-11 ENCOUNTER — Other Ambulatory Visit (HOSPITAL_COMMUNITY): Payer: Self-pay

## 2021-05-11 DIAGNOSIS — F332 Major depressive disorder, recurrent severe without psychotic features: Secondary | ICD-10-CM

## 2021-05-11 DIAGNOSIS — F330 Major depressive disorder, recurrent, mild: Secondary | ICD-10-CM
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
## 2021-05-15 ENCOUNTER — Other Ambulatory Visit (HOSPITAL_COMMUNITY): Payer: Self-pay

## 2021-05-16 ENCOUNTER — Other Ambulatory Visit: Payer: Self-pay

## 2021-05-16 ENCOUNTER — Encounter: Payer: Self-pay | Admitting: Psychiatry

## 2021-05-16 ENCOUNTER — Telehealth (INDEPENDENT_AMBULATORY_CARE_PROVIDER_SITE_OTHER): Payer: 59 | Admitting: Psychiatry

## 2021-05-16 ENCOUNTER — Other Ambulatory Visit (HOSPITAL_COMMUNITY): Payer: Self-pay

## 2021-05-16 DIAGNOSIS — R41840 Attention and concentration deficit: Secondary | ICD-10-CM | POA: Diagnosis not present

## 2021-05-16 DIAGNOSIS — F4310 Post-traumatic stress disorder, unspecified: Secondary | ICD-10-CM | POA: Diagnosis not present

## 2021-05-16 DIAGNOSIS — F330 Major depressive disorder, recurrent, mild: Secondary | ICD-10-CM | POA: Diagnosis not present

## 2021-05-16 DIAGNOSIS — F331 Major depressive disorder, recurrent, moderate: Secondary | ICD-10-CM | POA: Diagnosis not present

## 2021-05-16 DIAGNOSIS — F332 Major depressive disorder, recurrent severe without psychotic features: Secondary | ICD-10-CM | POA: Diagnosis not present

## 2021-05-16 MED ORDER — BUPROPION HCL ER (XL) 300 MG PO TB24
300.0000 mg | ORAL_TABLET | Freq: Every day | ORAL | 1 refills | Status: DC
  Filled 2021-05-16: qty 30, 30d supply, fill #0
  Filled 2021-05-22 – 2021-06-17 (×2): qty 30, 30d supply, fill #1

## 2021-05-16 MED ORDER — AMPHETAMINE-DEXTROAMPHET ER 20 MG PO CP24
20.0000 mg | ORAL_CAPSULE | ORAL | 0 refills | Status: DC
  Filled 2021-06-22: qty 30, 30d supply, fill #0

## 2021-05-16 MED ORDER — AMPHETAMINE-DEXTROAMPHET ER 20 MG PO CP24
20.0000 mg | ORAL_CAPSULE | ORAL | 0 refills | Status: DC
  Filled 2021-05-22: qty 30, 30d supply, fill #0

## 2021-05-16 MED ORDER — BUSPIRONE HCL 10 MG PO TABS
10.0000 mg | ORAL_TABLET | Freq: Three times a day (TID) | ORAL | 5 refills | Status: AC
  Filled 2021-05-16: qty 90, 30d supply, fill #0
  Filled 2021-05-22 – 2021-06-17 (×2): qty 90, 30d supply, fill #1
  Filled 2021-07-21: qty 90, 30d supply, fill #2
  Filled 2021-09-09: qty 90, 30d supply, fill #3
  Filled 2021-10-11: qty 90, 30d supply, fill #4

## 2021-05-16 NOTE — Patient Instructions (Addendum)
Continue sertraline 150 mg daily ?Continue bupropion 300 mg daily ?Continue Buspar 10 mg three times a day ?Continue Adderall 20 mg daily ?Continue prazosin 2 mg at night  ?Next appointment- 5/16 at 2:30, in person ? ?The next visit will be in person visit. Please arrive 15 mins before the scheduled time.  ? ?Lydia Estrada  ?Address: [address], [place], [place] [address]   ?

## 2021-05-22 ENCOUNTER — Other Ambulatory Visit (HOSPITAL_COMMUNITY): Payer: Self-pay

## 2021-05-22 ENCOUNTER — Other Ambulatory Visit: Payer: Self-pay | Admitting: Psychiatry

## 2021-05-23 ENCOUNTER — Ambulatory Visit: Payer: 59 | Admitting: Psychology

## 2021-05-23 ENCOUNTER — Other Ambulatory Visit (HOSPITAL_COMMUNITY): Payer: Self-pay

## 2021-05-24 ENCOUNTER — Other Ambulatory Visit (INDEPENDENT_AMBULATORY_CARE_PROVIDER_SITE_OTHER): Payer: Self-pay

## 2021-05-24 ENCOUNTER — Other Ambulatory Visit: Payer: Self-pay | Admitting: Psychiatry

## 2021-05-24 ENCOUNTER — Other Ambulatory Visit (HOSPITAL_COMMUNITY): Payer: Self-pay

## 2021-05-24 ENCOUNTER — Other Ambulatory Visit (INDEPENDENT_AMBULATORY_CARE_PROVIDER_SITE_OTHER): Payer: Self-pay | Admitting: Family

## 2021-05-24 MED ORDER — GVOKE HYPOPEN 1-PACK 1 MG/0.2ML ~~LOC~~ SOAJ
SUBCUTANEOUS | 1 refills | Status: DC
  Filled 2021-05-24: qty 0.2, 1d supply, fill #0
  Filled 2021-09-09: qty 0.2, 1d supply, fill #1

## 2021-05-25 ENCOUNTER — Other Ambulatory Visit (HOSPITAL_COMMUNITY): Payer: Self-pay

## 2021-05-27 ENCOUNTER — Other Ambulatory Visit (HOSPITAL_COMMUNITY): Payer: Self-pay

## 2021-06-06 ENCOUNTER — Encounter (INDEPENDENT_AMBULATORY_CARE_PROVIDER_SITE_OTHER): Payer: Self-pay

## 2021-06-07 DIAGNOSIS — E1065 Type 1 diabetes mellitus with hyperglycemia: Secondary | ICD-10-CM | POA: Diagnosis not present

## 2021-06-07 DIAGNOSIS — Z794 Long term (current) use of insulin: Secondary | ICD-10-CM | POA: Diagnosis not present

## 2021-06-07 DIAGNOSIS — E109 Type 1 diabetes mellitus without complications: Secondary | ICD-10-CM | POA: Diagnosis not present

## 2021-06-10 ENCOUNTER — Encounter: Payer: 59 | Admitting: Psychology

## 2021-06-17 ENCOUNTER — Other Ambulatory Visit (INDEPENDENT_AMBULATORY_CARE_PROVIDER_SITE_OTHER): Payer: Self-pay | Admitting: Family

## 2021-06-17 ENCOUNTER — Other Ambulatory Visit: Payer: Self-pay | Admitting: Psychiatry

## 2021-06-18 ENCOUNTER — Other Ambulatory Visit (HOSPITAL_COMMUNITY): Payer: Self-pay

## 2021-06-18 MED ORDER — METFORMIN HCL ER 500 MG PO TB24
ORAL_TABLET | ORAL | 1 refills | Status: DC
  Filled 2021-06-18: qty 270, 90d supply, fill #0

## 2021-06-18 MED ORDER — UNIFINE PENTIPS 32G X 4 MM MISC
1 refills | Status: AC
  Filled 2021-06-18: qty 600, 90d supply, fill #0
  Filled 2021-09-09: qty 600, 90d supply, fill #1

## 2021-06-20 ENCOUNTER — Other Ambulatory Visit (HOSPITAL_COMMUNITY): Payer: Self-pay

## 2021-06-20 ENCOUNTER — Other Ambulatory Visit: Payer: Self-pay | Admitting: Psychiatry

## 2021-06-22 ENCOUNTER — Other Ambulatory Visit (HOSPITAL_COMMUNITY): Payer: Self-pay

## 2021-06-24 ENCOUNTER — Other Ambulatory Visit (HOSPITAL_COMMUNITY): Payer: Self-pay

## 2021-06-24 ENCOUNTER — Other Ambulatory Visit: Payer: Self-pay | Admitting: Psychiatry

## 2021-06-24 MED ORDER — PRAZOSIN HCL 2 MG PO CAPS
2.0000 mg | ORAL_CAPSULE | Freq: Every day | ORAL | 0 refills | Status: DC
  Filled 2021-06-24: qty 30, 30d supply, fill #0

## 2021-06-24 MED ORDER — SERTRALINE HCL 100 MG PO TABS
200.0000 mg | ORAL_TABLET | Freq: Every day | ORAL | 0 refills | Status: DC
  Filled 2021-06-24: qty 60, 30d supply, fill #0

## 2021-06-24 MED ORDER — AMPHETAMINE-DEXTROAMPHET ER 20 MG PO CP24
20.0000 mg | ORAL_CAPSULE | ORAL | 0 refills | Status: DC
  Filled 2021-06-24 – 2021-07-21 (×2): qty 30, 30d supply, fill #0

## 2021-07-01 ENCOUNTER — Encounter (INDEPENDENT_AMBULATORY_CARE_PROVIDER_SITE_OTHER): Payer: Self-pay

## 2021-07-01 ENCOUNTER — Telehealth (INDEPENDENT_AMBULATORY_CARE_PROVIDER_SITE_OTHER): Payer: Self-pay

## 2021-07-02 ENCOUNTER — Ambulatory Visit (INDEPENDENT_AMBULATORY_CARE_PROVIDER_SITE_OTHER): Payer: 59 | Admitting: Family

## 2021-07-04 ENCOUNTER — Other Ambulatory Visit: Payer: Self-pay

## 2021-07-07 DIAGNOSIS — E1065 Type 1 diabetes mellitus with hyperglycemia: Secondary | ICD-10-CM | POA: Diagnosis not present

## 2021-07-07 DIAGNOSIS — E109 Type 1 diabetes mellitus without complications: Secondary | ICD-10-CM | POA: Diagnosis not present

## 2021-07-07 DIAGNOSIS — Z794 Long term (current) use of insulin: Secondary | ICD-10-CM | POA: Diagnosis not present

## 2021-07-09 ENCOUNTER — Ambulatory Visit (INDEPENDENT_AMBULATORY_CARE_PROVIDER_SITE_OTHER): Payer: 59 | Admitting: Family

## 2021-07-09 ENCOUNTER — Encounter (INDEPENDENT_AMBULATORY_CARE_PROVIDER_SITE_OTHER): Payer: Self-pay | Admitting: Family

## 2021-07-09 ENCOUNTER — Other Ambulatory Visit (HOSPITAL_COMMUNITY): Payer: Self-pay

## 2021-07-09 VITALS — BP 118/74 | HR 84 | Wt 336.6 lb

## 2021-07-09 DIAGNOSIS — N926 Irregular menstruation, unspecified: Secondary | ICD-10-CM

## 2021-07-09 DIAGNOSIS — R894 Abnormal immunological findings in specimens from other organs, systems and tissues: Secondary | ICD-10-CM | POA: Insufficient documentation

## 2021-07-09 DIAGNOSIS — E1065 Type 1 diabetes mellitus with hyperglycemia: Secondary | ICD-10-CM | POA: Diagnosis not present

## 2021-07-09 DIAGNOSIS — F329 Major depressive disorder, single episode, unspecified: Secondary | ICD-10-CM | POA: Insufficient documentation

## 2021-07-09 DIAGNOSIS — Z68.41 Body mass index (BMI) pediatric, greater than or equal to 95th percentile for age: Secondary | ICD-10-CM | POA: Diagnosis not present

## 2021-07-09 DIAGNOSIS — Z794 Long term (current) use of insulin: Secondary | ICD-10-CM | POA: Diagnosis not present

## 2021-07-09 LAB — POCT GLUCOSE (DEVICE FOR HOME USE): POC Glucose: 213 mg/dl — AB (ref 70–99)

## 2021-07-09 MED ORDER — HUMALOG KWIKPEN 200 UNIT/ML ~~LOC~~ SOPN
PEN_INJECTOR | SUBCUTANEOUS | 3 refills | Status: DC
  Filled 2021-07-09: qty 60, 60d supply, fill #0
  Filled 2021-07-21: qty 12, 28d supply, fill #0
  Filled 2021-08-21: qty 12, 28d supply, fill #1
  Filled 2021-09-09: qty 12, 28d supply, fill #2
  Filled 2021-10-11: qty 12, 28d supply, fill #3
  Filled 2021-11-27 – 2022-02-26 (×8): qty 12, 28d supply, fill #4
  Filled 2022-03-04: qty 30, 30d supply, fill #4
  Filled 2022-03-05 (×2): qty 12, 28d supply, fill #4

## 2021-07-09 MED ORDER — METFORMIN HCL ER 500 MG PO TB24
ORAL_TABLET | ORAL | 1 refills | Status: DC
  Filled 2021-07-09: qty 270, fill #0
  Filled 2021-09-09: qty 270, 90d supply, fill #0
  Filled 2021-12-26: qty 270, 90d supply, fill #1

## 2021-07-09 MED ORDER — TRESIBA FLEXTOUCH 200 UNIT/ML ~~LOC~~ SOPN
PEN_INJECTOR | SUBCUTANEOUS | 3 refills | Status: DC
  Filled 2021-07-09: qty 18, fill #0
  Filled 2021-07-21: qty 18, 36d supply, fill #0
  Filled 2021-08-21: qty 15, 30d supply, fill #1
  Filled 2021-09-09: qty 15, 30d supply, fill #2
  Filled 2021-10-23: qty 15, 30d supply, fill #3
  Filled 2021-11-27: qty 9, 18d supply, fill #4

## 2021-07-09 NOTE — Patient Instructions (Signed)
It was a pleasure seeing you in clinic today. Please do not hesitate to contact me if you have questions or concerns.  ? ?Please sign up for MyChart. This is a communication tool that allows you to send an email directly to me. This can be used for questions, prescriptions and blood sugar reports. We will also release labs to you with instructions on MyChart. Please do not use MyChart if you need immediate or emergency assistance. Ask our wonderful front office staff if you need assistance.  ? ?- I have sent in 3 months of supplies for Metformin, Humalog U200 and Tresiba U200.  ?- Good luck with everything! You are going to do great with adult endocrine.  ? ?_ labs today.  ? ? ?

## 2021-07-09 NOTE — Progress Notes (Signed)
Pediatric Endocrinology Diabetes Consultation Follow-up Visit ? ?Lydia Estrada ?June 15, 2000 ?[id number] ? ?Chief Complaint: Follow-up Type 1 Diabetes  ? ?Lydia Estrada, Lydia Barthel, DO ? ? ?HPI: ?Lydia Estrada  is a 21 y.o. female presenting for follow-up of Type 1 Diabetes ? ? she attended this visit alone. ? ?1. Lydia Estrada was admitted to the PICU at [facility] on 03/29/17 for DKA and new-onset T1DM.  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  She was given several boluses of NS and started on an insulin infusion. She was then transferred to our PICU where she was also started on iv antibiotics for several draining skin lesions. Skin cultures subsequently grew out MRSA. Omia was started on an omnipod dash pump on 09/29/17. ?  ?2. Since last visit to [facility] on 01/2020 , she has been well.   ? ?She will be transitioning to Adult endocrinology at [facility] which is closer to where she lives in [place]. She is currently living with her boyfriend. She has started seeing a therapist and also psychiatrist. She is taking Trazodone, Zoloft and buspirone. She also takes hydroxyzine for sleep at night.  ? ?She has stopped using Omnipod insulin pump. She uses 81 of Tresiba U200 night. Uses Humalog U200 for carb and blood sugar correction. She is using 1 unit per 8 grams of carbs and 1 unit for every 50 above 120. This results in about 15 units of Humalog u200 per meal. She is wearing Dexcom CGm consistently. Hypoglycemia occurs "a  few here and there. The lowest is 60".  ? ?Blood sugars tend to run higher after she eats dinner, she thinks that she underestimates her carbs.  ? ?She has started doing more exercise, going on long walks which she thinks has helped decrease her insulin resistance. She is also doing "less depression  eating".  ? ?Take 1500 mg of Metformin XR daily with breakfast.  ? ?Insulin regimen: ?Humalog U200 120/50/8 plan  ?Taking 81 units of Tresiba U200  ?Metformin 1500 mg  ? ?Hypoglycemia: Not really having many lows, able to feel most when awake.  No pattern.  No glucagon needed. ?Pump download: ? Did not bring to clinic   ?CGM download: Using dexcom G6 ? ? ?Med-alert ID: Wearing today. ?Injection/Pump sites: Abdomen, arms, legs, butt, back ?Annual labs due: Ordered  ?Ophthalmology due: 2022 ? ? ? ?Periods: ?Had secondary amenorrhea after dx of DM.  Had a period 10/2017, and then 01/2018.  Most recent period was awful, extremely heavy, lasted 8 days.  Interested in birth control for sexual activity and to help with menstrual cycles.  She does have a history of migraine headaches with sensitivity to light, improved some with ibuprofen, sometimes requires sleep.  Has more headaches when BGs are low and high, sometimes has spotty vision.  It is difficult to determine whether she has migraines with aura.  + family history of migraines.    ? ?ROS:  ?All systems reviewed with pertinent positives listed below; otherwise negative. ?Constitutional: Sleeping well. + weight gain  ?HEENT: Wears glasses as needed. No blurry vision.  ?Respiratory: No increased work of breathing currently ?Cardiac: no palpitations no tachycardia.  ?GI: No constipation or diarrhea ?GU: periods as above. On OCP.  ?Musculoskeletal: No joint deformity ?Neuro: Normal affect. No headaches.  ?Endocrine: As above ? ?  Past Medical History:   ?Past Medical History:  ?Diagnosis Date  ? Herpes   ? type 1 and 2  ? Type 1 diabetes mellitus ([place])   ? Dx 03/2017, A1c 11%, presented in DKA. GAD antibodies markedly positive at 1493 (<5)  ? ? ?Medications:  ?Outpatient Encounter Medications as of 07/09/2021  ?Medication Sig  ? amphetamine-dextroamphetamine (ADDERALL XR) 20 MG 24 hr capsule Take 1 capsule (20 mg total) by mouth every morning.  ? buPROPion (WELLBUTRIN XL) 300  MG 24 hr tablet Take 1 tablet (300 mg total) by mouth daily.  ? busPIRone (BUSPAR) 10 MG tablet Take 1 tablet (10 mg total) by mouth 3 (three) times daily.  ? EPINEPHrine 0.3 mg/0.3 mL IJ SOAJ injection See admin instructions.  ? Glucagon (GVOKE HYPOPEN 1-PACK) 1 MG/0.2ML SOAJ Use in case of severe hypoglycemia  ? glucose blood (FREESTYLE LITE) test strip See admin instructions.  ? hydrOXYzine (ATARAX) 25 MG tablet 1 tablet as needed  ? Insulin Pen Needle (UNIFINE PENTIPS) 32G X 4 MM MISC USE TO INJECT INSULIN 6 TIMES A DAY  ? prazosin (MINIPRESS) 2 MG capsule Take 1 capsule (2 mg total) by mouth at bedtime.  ? sertraline (ZOLOFT) 100 MG tablet Take 2 tablets (200 mg total) by mouth daily.  ? [DISCONTINUED] insulin degludec (TRESIBA FLEXTOUCH) 200 UNIT/ML FlexTouch Pen INJECT 100 UNITS UNDER THE SKIN DAILY AS DIRECTED  ? [DISCONTINUED] insulin lispro (HUMALOG KWIKPEN) 200 UNIT/ML KwikPen INJECT 150 UNITS INTO THE SKIN DAILY.  ? [DISCONTINUED] metFORMIN (GLUCOPHAGE-XR) 500 MG 24 hr tablet TAKE 3 TABLETS BY MOUTH AT BREAKFAST  ? insulin degludec (TRESIBA FLEXTOUCH) 200 UNIT/ML FlexTouch Pen Inject up to 100 units per day (81 units currently)  ? Insulin Human (INSULIN PUMP) SOLN Inject 1 each into the skin 4 (four) times daily -  before meals and at bedtime. (Patient not taking: Reported on 07/09/2021)  ? insulin lispro (HUMALOG KWIKPEN) 200 UNIT/ML KwikPen Inject up to 100 units daily.  ? metFORMIN (GLUCOPHAGE-XR) 500 MG 24 hr tablet TAKE 3 TABLETS BY MOUTH AT BREAKFAST  ? [DISCONTINUED] amphetamine-dextroamphetamine (ADDERALL XR) 20 MG 24 hr capsule Take 1 capsule by mouth every morning.  **04/21/21  ? [DISCONTINUED] amphetamine-dextroamphetamine (ADDERALL XR) 20 MG 24 hr capsule Take 1 capsule (20 mg total) by mouth every morning.  **06/21/2021  ? [DISCONTINUED] ELINEST 0.3-30 MG-MCG tablet Take 0.3 mg by mouth at bedtime. (Patient not taking: No sig reported)  ? [DISCONTINUED] glucagon 1 MG injection Follow package  directions for low blood sugar.  ? [DISCONTINUED] lurasidone (LATUDA) 40 MG TABS tablet Take 1 tablet (40 mg total) by mouth daily with supper.  ? [DISCONTINUED] nicotine polacrilex (NICORETTE) 4 MG gum Chew up to 24 pieces daily as directed (Patient not taking: Reported on 10/09/2020)  ? [DISCONTINUED] sertraline (ZOLOFT) 100 MG tablet Take 1 and 1/2 tablets (150 mg total) by mouth daily.  ? ?No facility-administered encounter medications on file as of 07/09/2021.  ? ? ?Allergies: ?Allergies  ?Allergen Reactions  ? Bee Venom Anaphylaxis  ? Grapefruit Concentrate   ? Ibuprofen   ?  Throat swelling  ? Naproxen   ? Other   ?  Seaweed, bee sting (anaphalytic)   ?  ?Allergic to seaweed ? ?Surgical History: ?Past Surgical History:  ?Procedure Laterality Date  ? WISDOM TOOTH EXTRACTION    ? ? ?Family History:  ?Family History  ?Problem Relation Age of Onset  ? Schizophrenia Sister   ? Bipolar disorder Brother   ? Diabetes  Maternal Grandfather   ? Diabetes Maternal Grandmother   ? ?Has 12 siblings.  ?  ?Social History: ?Lives with: mother, father and siblings (1of 61 children) ? ? ?Physical Exam:  ?Vitals:  ? 07/09/21 1331  ?BP: 118/74  ?Pulse: 84  ?Weight: (!) 336 lb 9.6 oz (152.7 kg)  ? ? ?BP 118/74   Pulse 84   Wt (!) 336 lb 9.6 oz (152.7 kg)   LMP 06/21/2021   BMI 51.19 kg/m?  ?Body mass index: body mass index is 51.19 kg/m?Marland Kitchen ?Growth percentile SmartLinks can only be used for patients less than 31 years old. ? ?Ht Readings from Last 3 Encounters:  ?05/02/20 5' 7.99" (1.727 m)  ?01/31/20 5\' 8"  (1.727 m) (93 %, Z= 1.45)*  ?05/10/19 5\' 8"  (1.727 m) (93 %, Z= 1.46)*  ? ?* Growth percentiles are based on CDC (Girls, 2-20 Years) data.  ? ?Wt Readings from Last 3 Encounters:  ?07/09/21 (!) 336 lb 9.6 oz (152.7 kg)  ?10/09/20 (!) 315 lb 9.6 oz (143.2 kg)  ?05/02/20 (!) 327 lb 12.8 oz (148.7 kg)  ? ?General: obese female in no acute distress.  ?Head: Normocephalic, atraumatic.   ?Eyes:  Pupils equal and round. EOMI.   Sclera  white.  No eye drainage.   ?Ears/Nose/Mouth/Throat: Nares patent, no nasal drainage.  Normal dentition, mucous membranes moist.   ?Neck: supple, no cervical lymphadenopathy, no thyromegaly ?Cardiovascular: r

## 2021-07-10 LAB — HEMOGLOBIN A1C
Hgb A1c MFr Bld: 7.9 % of total Hgb — ABNORMAL HIGH (ref <5.7)
Mean Plasma Glucose: 180 mg/dL
eAG (mmol/L): 10 mmol/L

## 2021-07-10 LAB — LIPID PANEL
Cholesterol: 185 mg/dL (ref <200)
HDL: 37 mg/dL — ABNORMAL LOW (ref >=50)
LDL Cholesterol (Calc): 109 mg/dL (calc) — ABNORMAL HIGH
Non-HDL Cholesterol (Calc): 148 mg/dL (calc) — ABNORMAL HIGH (ref <130)
Total CHOL/HDL Ratio: 5 (calc) — ABNORMAL HIGH (ref <5.0)
Triglycerides: 274 mg/dL — ABNORMAL HIGH (ref <150)

## 2021-07-10 LAB — TSH: TSH: 0.63 mIU/L

## 2021-07-10 LAB — MICROALBUMIN / CREATININE URINE RATIO
Creatinine, Urine: 426 mg/dL — ABNORMAL HIGH (ref 20–275)
Microalb Creat Ratio: 7 mcg/mg creat (ref <30)
Microalb, Ur: 3 mg/dL

## 2021-07-10 LAB — T4, FREE: Free T4: 1.2 ng/dL (ref 0.8–1.8)
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
## 2021-07-16 ENCOUNTER — Other Ambulatory Visit (HOSPITAL_COMMUNITY): Payer: Self-pay

## 2021-07-16 ENCOUNTER — Encounter: Payer: Self-pay | Admitting: Psychiatry

## 2021-07-16 ENCOUNTER — Ambulatory Visit (INDEPENDENT_AMBULATORY_CARE_PROVIDER_SITE_OTHER): Payer: 59 | Admitting: Psychiatry

## 2021-07-16 VITALS — BP 132/84 | HR 103 | Temp 97.0°F | Wt 339.8 lb

## 2021-07-16 DIAGNOSIS — R41840 Attention and concentration deficit: Secondary | ICD-10-CM | POA: Diagnosis not present

## 2021-07-16 DIAGNOSIS — F332 Major depressive disorder, recurrent severe without psychotic features: Secondary | ICD-10-CM

## 2021-07-16 DIAGNOSIS — F4310 Post-traumatic stress disorder, unspecified: Secondary | ICD-10-CM | POA: Diagnosis not present

## 2021-07-16 DIAGNOSIS — F1290 Cannabis use, unspecified, uncomplicated: Secondary | ICD-10-CM | POA: Diagnosis not present

## 2021-07-16 MED ORDER — PRAZOSIN HCL 2 MG PO CAPS
2.0000 mg | ORAL_CAPSULE | Freq: Every day | ORAL | 2 refills | Status: DC
  Filled 2021-07-16: qty 30, 30d supply, fill #0
  Filled 2021-09-09: qty 30, 30d supply, fill #1
  Filled 2021-10-11: qty 30, 30d supply, fill #2

## 2021-07-16 MED ORDER — SERTRALINE HCL 100 MG PO TABS
200.0000 mg | ORAL_TABLET | Freq: Every day | ORAL | 0 refills | Status: DC
Start: 2021-07-25 — End: 2021-08-21
  Filled 2021-07-16: qty 60, 30d supply, fill #0

## 2021-07-16 MED ORDER — BUPROPION HCL ER (XL) 300 MG PO TB24
300.0000 mg | ORAL_TABLET | Freq: Every day | ORAL | 2 refills | Status: DC
  Filled 2021-07-16: qty 30, 30d supply, fill #0
  Filled 2021-09-09: qty 30, 30d supply, fill #1
  Filled 2021-10-11: qty 30, 30d supply, fill #2

## 2021-07-16 NOTE — Patient Instructions (Signed)
?  Increase sertraline 200 mg daily ?Continue bupropion 300 mg daily ?Continue Buspar 10 mg three times a day ?Continue Adderall 20 mg daily. After the next refill this will be decreased to 10 mg daily ?Continue prazosin 2 mg at night  ?Next appointment- 6/21 at 8:30, video ?

## 2021-07-18 ENCOUNTER — Other Ambulatory Visit (HOSPITAL_COMMUNITY): Payer: Self-pay

## 2021-07-22 ENCOUNTER — Other Ambulatory Visit (HOSPITAL_COMMUNITY): Payer: Self-pay

## 2021-08-05 DIAGNOSIS — E10649 Type 1 diabetes mellitus with hypoglycemia without coma: Secondary | ICD-10-CM | POA: Diagnosis not present

## 2021-08-05 DIAGNOSIS — E1065 Type 1 diabetes mellitus with hyperglycemia: Secondary | ICD-10-CM | POA: Diagnosis not present

## 2021-08-06 DIAGNOSIS — Z794 Long term (current) use of insulin: Secondary | ICD-10-CM | POA: Diagnosis not present

## 2021-08-06 DIAGNOSIS — E109 Type 1 diabetes mellitus without complications: Secondary | ICD-10-CM | POA: Diagnosis not present

## 2021-08-06 DIAGNOSIS — E1065 Type 1 diabetes mellitus with hyperglycemia: Secondary | ICD-10-CM | POA: Diagnosis not present
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
## 2021-08-21 ENCOUNTER — Telehealth (INDEPENDENT_AMBULATORY_CARE_PROVIDER_SITE_OTHER): Payer: 59 | Admitting: Psychiatry

## 2021-08-21 ENCOUNTER — Encounter: Payer: Self-pay | Admitting: Psychiatry

## 2021-08-21 ENCOUNTER — Other Ambulatory Visit (HOSPITAL_COMMUNITY): Payer: Self-pay

## 2021-08-21 DIAGNOSIS — R41840 Attention and concentration deficit: Secondary | ICD-10-CM

## 2021-08-21 DIAGNOSIS — F1290 Cannabis use, unspecified, uncomplicated: Secondary | ICD-10-CM

## 2021-08-21 DIAGNOSIS — F4310 Post-traumatic stress disorder, unspecified: Secondary | ICD-10-CM | POA: Diagnosis not present

## 2021-08-21 DIAGNOSIS — F330 Major depressive disorder, recurrent, mild: Secondary | ICD-10-CM

## 2021-08-21 MED ORDER — AMPHETAMINE-DEXTROAMPHET ER 10 MG PO CP24
10.0000 mg | ORAL_CAPSULE | ORAL | 0 refills | Status: DC
  Filled 2021-08-21: qty 30, 30d supply, fill #0

## 2021-08-21 MED ORDER — AMPHETAMINE-DEXTROAMPHET ER 10 MG PO CP24: 10.0000 mg | ORAL_CAPSULE | ORAL | 0 refills | Status: DC

## 2021-08-21 MED ORDER — SERTRALINE HCL 100 MG PO TABS
200.0000 mg | ORAL_TABLET | Freq: Every day | ORAL | 2 refills | Status: DC
Start: 2021-08-25 — End: 2021-10-23
  Filled 2021-08-21: qty 60, 30d supply, fill #0
  Filled 2021-09-16: qty 60, 30d supply, fill #1
  Filled 2021-10-11: qty 60, 30d supply, fill #2

## 2021-08-21 MED ORDER — BUPROPION HCL ER (XL) 150 MG PO TB24
150.0000 mg | ORAL_TABLET | Freq: Every day | ORAL | 1 refills | Status: DC
  Filled 2021-08-21: qty 30, 30d supply, fill #0
  Filled 2021-09-16: qty 30, 30d supply, fill #1

## 2021-08-21 NOTE — Addendum Note (Signed)
Addended by: Neysa Hotter on: 08/21/2021 10:54 AM   Modules accepted: Orders

## 2021-08-21 NOTE — Patient Instructions (Signed)
Continue sertraline 200 mg daily Increase bupropion 450 mg daily Continue Buspar 10 mg three times a day Decrease Adderall XR 10 mg daily Continue prazosin 2 mg at night  Next appointment- 7/26 at 1:30

## 2021-08-30 DIAGNOSIS — Z794 Long term (current) use of insulin: Secondary | ICD-10-CM | POA: Diagnosis not present

## 2021-08-30 DIAGNOSIS — E109 Type 1 diabetes mellitus without complications: Secondary | ICD-10-CM | POA: Diagnosis not present

## 2021-08-30 DIAGNOSIS — E1065 Type 1 diabetes mellitus with hyperglycemia: Secondary | ICD-10-CM | POA: Diagnosis not present

## 2021-09-09 ENCOUNTER — Other Ambulatory Visit: Payer: Self-pay | Admitting: Psychiatry

## 2021-09-09 ENCOUNTER — Other Ambulatory Visit (HOSPITAL_COMMUNITY): Payer: Self-pay

## 2021-09-10 ENCOUNTER — Other Ambulatory Visit (HOSPITAL_COMMUNITY): Payer: Self-pay

## 2021-09-16 ENCOUNTER — Other Ambulatory Visit (HOSPITAL_COMMUNITY): Payer: Self-pay

## 2021-09-16 ENCOUNTER — Other Ambulatory Visit (INDEPENDENT_AMBULATORY_CARE_PROVIDER_SITE_OTHER): Payer: Self-pay | Admitting: Family

## 2021-09-16 ENCOUNTER — Other Ambulatory Visit: Payer: Self-pay | Admitting: Psychiatry

## 2021-09-16 MED ORDER — GVOKE HYPOPEN 1-PACK 1 MG/0.2ML ~~LOC~~ SOAJ
SUBCUTANEOUS | 1 refills | Status: DC
  Filled 2021-09-16: qty 0.2, 1d supply, fill #0
  Filled 2021-10-11: qty 0.2, 1d supply, fill #1

## 2021-09-17 ENCOUNTER — Other Ambulatory Visit (HOSPITAL_COMMUNITY): Payer: Self-pay

## 2021-09-18 ENCOUNTER — Other Ambulatory Visit (HOSPITAL_COMMUNITY): Payer: Self-pay

## 2021-09-18 ENCOUNTER — Encounter (INDEPENDENT_AMBULATORY_CARE_PROVIDER_SITE_OTHER): Payer: Self-pay

## 2021-09-18 DIAGNOSIS — Z6841 Body Mass Index (BMI) 40.0 and over, adult: Secondary | ICD-10-CM | POA: Diagnosis not present

## 2021-09-18 DIAGNOSIS — E119 Type 2 diabetes mellitus without complications: Secondary | ICD-10-CM | POA: Diagnosis not present

## 2021-09-18 MED ORDER — OZEMPIC (0.25 OR 0.5 MG/DOSE) 2 MG/3ML ~~LOC~~ SOPN
PEN_INJECTOR | SUBCUTANEOUS | 5 refills | Status: DC
  Filled 2021-09-18: qty 3, 42d supply, fill #0
  Filled 2021-10-23: qty 3, 28d supply, fill #1
  Filled 2021-11-27: qty 3, 28d supply, fill #2
  Filled 2021-12-26: qty 3, 28d supply, fill #3

## 2021-09-18 MED ORDER — HUMALOG KWIKPEN 200 UNIT/ML ~~LOC~~ SOPN
PEN_INJECTOR | SUBCUTANEOUS | 1 refills | Status: DC
  Filled 2021-09-18 – 2021-10-11 (×2): qty 30, 85d supply, fill #0
  Filled 2021-10-23: qty 36, 90d supply, fill #0
  Filled 2021-10-25: qty 36, fill #0
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
## 2021-09-25 ENCOUNTER — Telehealth: Payer: 59 | Admitting: Psychiatry

## 2021-09-25 ENCOUNTER — Telehealth: Payer: Self-pay | Admitting: Psychiatry

## 2021-09-25 NOTE — Telephone Encounter (Signed)
Sent link for video visit through Epic. Patient did not sign in. Called the patient for appointment scheduled today. The patient did not answer the phone. Left voice message to contact the office ([phone]).   ?

## 2021-09-27 ENCOUNTER — Other Ambulatory Visit (HOSPITAL_COMMUNITY): Payer: Self-pay

## 2021-09-27 ENCOUNTER — Telehealth (INDEPENDENT_AMBULATORY_CARE_PROVIDER_SITE_OTHER): Payer: 59 | Admitting: Psychiatry

## 2021-09-27 ENCOUNTER — Encounter: Payer: Self-pay | Admitting: Psychiatry

## 2021-09-27 DIAGNOSIS — F4310 Post-traumatic stress disorder, unspecified: Secondary | ICD-10-CM | POA: Diagnosis not present

## 2021-09-27 DIAGNOSIS — R41840 Attention and concentration deficit: Secondary | ICD-10-CM

## 2021-09-27 DIAGNOSIS — R5383 Other fatigue: Secondary | ICD-10-CM | POA: Diagnosis not present

## 2021-09-27 DIAGNOSIS — F330 Major depressive disorder, recurrent, mild: Secondary | ICD-10-CM

## 2021-09-27 MED ORDER — AMPHETAMINE-DEXTROAMPHET ER 10 MG PO CP24
10.0000 mg | ORAL_CAPSULE | ORAL | 0 refills | Status: DC
  Filled 2021-09-27: qty 30, 30d supply, fill #0

## 2021-09-27 MED ORDER — BUPROPION HCL ER (XL) 150 MG PO TB24
150.0000 mg | ORAL_TABLET | Freq: Every day | ORAL | 2 refills | Status: DC
Start: 2021-10-21 — End: 2022-01-19
  Filled 2021-09-27 – 2021-10-23 (×2): qty 30, 30d supply, fill #0
  Filled 2021-11-27: qty 30, 30d supply, fill #1
  Filled 2021-12-26: qty 30, 30d supply, fill #2
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
## 2021-09-29 DIAGNOSIS — E109 Type 1 diabetes mellitus without complications: Secondary | ICD-10-CM | POA: Diagnosis not present

## 2021-09-29 DIAGNOSIS — E1065 Type 1 diabetes mellitus with hyperglycemia: Secondary | ICD-10-CM | POA: Diagnosis not present

## 2021-09-29 DIAGNOSIS — Z794 Long term (current) use of insulin: Secondary | ICD-10-CM | POA: Diagnosis not present

## 2021-10-01 ENCOUNTER — Ambulatory Visit: Payer: 59 | Admitting: Professional

## 2021-10-03 ENCOUNTER — Encounter (INDEPENDENT_AMBULATORY_CARE_PROVIDER_SITE_OTHER): Payer: Self-pay

## 2021-10-11 ENCOUNTER — Other Ambulatory Visit: Payer: Self-pay | Admitting: Psychiatry

## 2021-10-11 ENCOUNTER — Other Ambulatory Visit (HOSPITAL_COMMUNITY): Payer: Self-pay

## 2021-10-14 ENCOUNTER — Other Ambulatory Visit (HOSPITAL_COMMUNITY): Payer: Self-pay
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
## 2021-10-23 ENCOUNTER — Other Ambulatory Visit (INDEPENDENT_AMBULATORY_CARE_PROVIDER_SITE_OTHER): Payer: Self-pay | Admitting: Family

## 2021-10-23 ENCOUNTER — Other Ambulatory Visit: Payer: Self-pay | Admitting: Psychiatry

## 2021-10-23 ENCOUNTER — Other Ambulatory Visit (HOSPITAL_COMMUNITY): Payer: Self-pay

## 2021-10-23 ENCOUNTER — Telehealth (INDEPENDENT_AMBULATORY_CARE_PROVIDER_SITE_OTHER): Payer: 59 | Admitting: Psychiatry

## 2021-10-23 ENCOUNTER — Encounter: Payer: Self-pay | Admitting: Psychiatry

## 2021-10-23 DIAGNOSIS — F4310 Post-traumatic stress disorder, unspecified: Secondary | ICD-10-CM

## 2021-10-23 DIAGNOSIS — F330 Major depressive disorder, recurrent, mild: Secondary | ICD-10-CM

## 2021-10-23 MED ORDER — PRAZOSIN HCL 2 MG PO CAPS
2.0000 mg | ORAL_CAPSULE | Freq: Every day | ORAL | 5 refills | Status: DC
  Filled 2021-10-23 (×2): qty 30, 30d supply, fill #0
  Filled 2021-12-26 – 2022-01-04 (×2): qty 30, 30d supply, fill #1
  Filled 2022-01-19 – 2022-01-28 (×2): qty 30, 30d supply, fill #2
  Filled 2022-02-21: qty 30, 30d supply, fill #3
  Filled 2022-04-01: qty 30, 30d supply, fill #4
  Filled 2022-05-01: qty 30, 30d supply, fill #5

## 2021-10-23 MED ORDER — BUPROPION HCL ER (XL) 300 MG PO TB24
300.0000 mg | ORAL_TABLET | Freq: Every day | ORAL | 2 refills | Status: DC
  Filled 2021-10-23 – 2021-11-27 (×3): qty 30, 30d supply, fill #0
  Filled 2021-12-26: qty 30, 30d supply, fill #1
  Filled 2022-01-19: qty 30, 30d supply, fill #2

## 2021-10-23 MED ORDER — SERTRALINE HCL 100 MG PO TABS
200.0000 mg | ORAL_TABLET | Freq: Every day | ORAL | 2 refills | Status: DC
Start: 2021-11-24 — End: 2022-01-29
  Filled 2021-10-23 – 2021-11-27 (×2): qty 60, 30d supply, fill #0
  Filled 2021-12-26: qty 60, 30d supply, fill #1
  Filled 2022-01-19: qty 60, 30d supply, fill #2

## 2021-10-24 ENCOUNTER — Other Ambulatory Visit (HOSPITAL_COMMUNITY): Payer: Self-pay

## 2021-10-25 ENCOUNTER — Other Ambulatory Visit: Payer: Self-pay | Admitting: Psychiatry

## 2021-10-25 ENCOUNTER — Other Ambulatory Visit (HOSPITAL_COMMUNITY): Payer: Self-pay

## 2021-10-25 ENCOUNTER — Ambulatory Visit: Payer: 59 | Admitting: Professional

## 2021-10-26 ENCOUNTER — Other Ambulatory Visit (HOSPITAL_COMMUNITY): Payer: Self-pay

## 2021-10-28 ENCOUNTER — Other Ambulatory Visit (HOSPITAL_COMMUNITY): Payer: Self-pay

## 2021-10-29 ENCOUNTER — Other Ambulatory Visit (HOSPITAL_COMMUNITY): Payer: Self-pay

## 2021-10-29 DIAGNOSIS — E1065 Type 1 diabetes mellitus with hyperglycemia: Secondary | ICD-10-CM | POA: Diagnosis not present

## 2021-10-29 DIAGNOSIS — E109 Type 1 diabetes mellitus without complications: Secondary | ICD-10-CM | POA: Diagnosis not present

## 2021-10-29 DIAGNOSIS — Z794 Long term (current) use of insulin: Secondary | ICD-10-CM | POA: Diagnosis not present

## 2021-10-29 MED ORDER — HUMALOG KWIKPEN 200 UNIT/ML ~~LOC~~ SOPN
PEN_INJECTOR | SUBCUTANEOUS | 1 refills | Status: DC
  Filled 2021-10-29 – 2021-11-05 (×2): qty 30, 85d supply, fill #0
  Filled 2022-01-07: qty 30, 85d supply, fill #1
  Filled 2022-02-21 – 2022-03-04 (×2): qty 30, 85d supply, fill #2

## 2021-10-31 DIAGNOSIS — E10649 Type 1 diabetes mellitus with hypoglycemia without coma: Secondary | ICD-10-CM | POA: Diagnosis not present

## 2021-10-31 DIAGNOSIS — E1065 Type 1 diabetes mellitus with hyperglycemia: Secondary | ICD-10-CM | POA: Diagnosis not present

## 2021-11-02 ENCOUNTER — Other Ambulatory Visit (HOSPITAL_COMMUNITY): Payer: Self-pay

## 2021-11-05 ENCOUNTER — Other Ambulatory Visit (HOSPITAL_COMMUNITY): Payer: Self-pay

## 2021-11-18 ENCOUNTER — Ambulatory Visit: Payer: 59 | Admitting: Professional

## 2021-11-23 DIAGNOSIS — Z794 Long term (current) use of insulin: Secondary | ICD-10-CM | POA: Diagnosis not present

## 2021-11-23 DIAGNOSIS — E109 Type 1 diabetes mellitus without complications: Secondary | ICD-10-CM | POA: Diagnosis not present

## 2021-11-23 DIAGNOSIS — E1065 Type 1 diabetes mellitus with hyperglycemia: Secondary | ICD-10-CM | POA: Diagnosis not present

## 2021-11-25 ENCOUNTER — Encounter: Payer: Self-pay | Admitting: Professional

## 2021-11-25 ENCOUNTER — Ambulatory Visit (INDEPENDENT_AMBULATORY_CARE_PROVIDER_SITE_OTHER): Payer: 59 | Admitting: Professional

## 2021-11-25 DIAGNOSIS — F330 Major depressive disorder, recurrent, mild: Secondary | ICD-10-CM | POA: Diagnosis not present

## 2021-11-25 DIAGNOSIS — F411 Generalized anxiety disorder: Secondary | ICD-10-CM | POA: Diagnosis not present

## 2021-11-25 DIAGNOSIS — F4310 Post-traumatic stress disorder, unspecified: Secondary | ICD-10-CM

## 2021-11-25 NOTE — Progress Notes (Unsigned)
° ° ° ° ° ° ° ° ° ° ° ° ° ° °  Orbie Grupe, LCMHC °
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
## 2021-11-26 DIAGNOSIS — F330 Major depressive disorder, recurrent, mild: Secondary | ICD-10-CM | POA: Insufficient documentation

## 2021-11-27 ENCOUNTER — Other Ambulatory Visit (HOSPITAL_COMMUNITY): Payer: Self-pay
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
## 2021-12-04 ENCOUNTER — Encounter: Payer: Self-pay | Admitting: Psychiatry

## 2021-12-04 ENCOUNTER — Telehealth (INDEPENDENT_AMBULATORY_CARE_PROVIDER_SITE_OTHER): Payer: 59 | Admitting: Psychiatry

## 2021-12-04 DIAGNOSIS — F17210 Nicotine dependence, cigarettes, uncomplicated: Secondary | ICD-10-CM | POA: Diagnosis not present

## 2021-12-04 DIAGNOSIS — F1290 Cannabis use, unspecified, uncomplicated: Secondary | ICD-10-CM | POA: Diagnosis not present

## 2021-12-04 DIAGNOSIS — F4310 Post-traumatic stress disorder, unspecified: Secondary | ICD-10-CM

## 2021-12-04 DIAGNOSIS — R41840 Attention and concentration deficit: Secondary | ICD-10-CM | POA: Diagnosis not present

## 2021-12-04 DIAGNOSIS — F330 Major depressive disorder, recurrent, mild: Secondary | ICD-10-CM

## 2021-12-04 DIAGNOSIS — F17200 Nicotine dependence, unspecified, uncomplicated: Secondary | ICD-10-CM

## 2021-12-06 ENCOUNTER — Ambulatory Visit (INDEPENDENT_AMBULATORY_CARE_PROVIDER_SITE_OTHER): Payer: 59 | Admitting: Professional

## 2021-12-06 ENCOUNTER — Other Ambulatory Visit (HOSPITAL_COMMUNITY): Payer: Self-pay

## 2021-12-06 ENCOUNTER — Encounter: Payer: Self-pay | Admitting: Professional

## 2021-12-06 DIAGNOSIS — F330 Major depressive disorder, recurrent, mild: Secondary | ICD-10-CM

## 2021-12-06 DIAGNOSIS — F900 Attention-deficit hyperactivity disorder, predominantly inattentive type: Secondary | ICD-10-CM

## 2021-12-06 DIAGNOSIS — F4310 Post-traumatic stress disorder, unspecified: Secondary | ICD-10-CM

## 2021-12-06 DIAGNOSIS — F411 Generalized anxiety disorder: Secondary | ICD-10-CM | POA: Diagnosis not present

## 2021-12-06 NOTE — Progress Notes (Signed)
° ° ° ° ° ° ° ° ° ° ° ° ° ° °  Lunetta Marina, LCMHC °
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
## 2021-12-10 ENCOUNTER — Ambulatory Visit: Payer: 59 | Admitting: Professional

## 2021-12-13 DIAGNOSIS — E119 Type 2 diabetes mellitus without complications: Secondary | ICD-10-CM | POA: Diagnosis not present

## 2021-12-13 DIAGNOSIS — Z6841 Body Mass Index (BMI) 40.0 and over, adult: Secondary | ICD-10-CM | POA: Diagnosis not present

## 2021-12-18 ENCOUNTER — Encounter: Payer: Self-pay | Admitting: Professional

## 2021-12-18 ENCOUNTER — Ambulatory Visit (INDEPENDENT_AMBULATORY_CARE_PROVIDER_SITE_OTHER): Payer: 59 | Admitting: Professional

## 2021-12-18 DIAGNOSIS — F411 Generalized anxiety disorder: Secondary | ICD-10-CM

## 2021-12-18 DIAGNOSIS — F4310 Post-traumatic stress disorder, unspecified: Secondary | ICD-10-CM

## 2021-12-18 DIAGNOSIS — F330 Major depressive disorder, recurrent, mild: Secondary | ICD-10-CM | POA: Diagnosis not present
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
## 2021-12-19 DIAGNOSIS — N926 Irregular menstruation, unspecified: Secondary | ICD-10-CM | POA: Diagnosis not present

## 2021-12-19 DIAGNOSIS — E109 Type 1 diabetes mellitus without complications: Secondary | ICD-10-CM | POA: Diagnosis not present

## 2021-12-19 DIAGNOSIS — E669 Obesity, unspecified: Secondary | ICD-10-CM | POA: Diagnosis not present

## 2021-12-19 DIAGNOSIS — F39 Unspecified mood [affective] disorder: Secondary | ICD-10-CM | POA: Diagnosis not present

## 2021-12-20 DIAGNOSIS — Z3201 Encounter for pregnancy test, result positive: Secondary | ICD-10-CM | POA: Diagnosis not present

## 2021-12-20 DIAGNOSIS — N926 Irregular menstruation, unspecified: Secondary | ICD-10-CM | POA: Diagnosis not present

## 2021-12-23 DIAGNOSIS — E109 Type 1 diabetes mellitus without complications: Secondary | ICD-10-CM | POA: Diagnosis not present

## 2021-12-23 DIAGNOSIS — E1065 Type 1 diabetes mellitus with hyperglycemia: Secondary | ICD-10-CM | POA: Diagnosis not present

## 2021-12-23 DIAGNOSIS — Z794 Long term (current) use of insulin: Secondary | ICD-10-CM | POA: Diagnosis not present

## 2021-12-26 ENCOUNTER — Other Ambulatory Visit (HOSPITAL_COMMUNITY): Payer: Self-pay

## 2021-12-26 ENCOUNTER — Other Ambulatory Visit (INDEPENDENT_AMBULATORY_CARE_PROVIDER_SITE_OTHER): Payer: Self-pay | Admitting: Family

## 2021-12-27 ENCOUNTER — Other Ambulatory Visit (HOSPITAL_COMMUNITY): Payer: Self-pay

## 2022-01-01 ENCOUNTER — Other Ambulatory Visit (HOSPITAL_COMMUNITY): Payer: Self-pay

## 2022-01-01 ENCOUNTER — Ambulatory Visit: Payer: 59 | Admitting: Professional

## 2022-01-01 DIAGNOSIS — Z6841 Body Mass Index (BMI) 40.0 and over, adult: Secondary | ICD-10-CM | POA: Diagnosis not present

## 2022-01-01 DIAGNOSIS — E119 Type 2 diabetes mellitus without complications: Secondary | ICD-10-CM | POA: Diagnosis not present

## 2022-01-02 ENCOUNTER — Other Ambulatory Visit (HOSPITAL_COMMUNITY): Payer: Self-pay

## 2022-01-03 ENCOUNTER — Other Ambulatory Visit (HOSPITAL_COMMUNITY): Payer: Self-pay

## 2022-01-03 MED ORDER — OMNIPOD 5 DEXG7G6 INTRO GEN 5 KIT
PACK | 0 refills | Status: DC
  Filled 2022-01-03: qty 1, 30d supply, fill #0

## 2022-01-03 MED ORDER — BAQSIMI TWO PACK 3 MG/DOSE NA POWD
1.0000 | NASAL | 2 refills | Status: DC
Start: 2022-01-01 — End: 2022-03-14
  Filled 2022-01-03: qty 2, 5d supply, fill #0
  Filled 2022-01-07 – 2022-01-19 (×2): qty 2, 5d supply, fill #1
  Filled 2022-02-04 – 2022-02-26 (×2): qty 2, 5d supply, fill #2

## 2022-01-03 MED ORDER — OMNIPOD 5 DEXG7G6 PODS GEN 5 MISC
5 refills | Status: DC
  Filled 2022-01-03 – 2022-01-07 (×3): qty 15, 30d supply, fill #0

## 2022-01-03 MED ORDER — GVOKE HYPOPEN 1-PACK 1 MG/0.2ML ~~LOC~~ SOAJ
SUBCUTANEOUS | 2 refills | Status: DC
  Filled 2022-01-03: qty 0.2, 5d supply, fill #0
  Filled 2022-01-07 – 2022-01-19 (×2): qty 0.2, 5d supply, fill #1
  Filled 2022-02-04: qty 0.2, 5d supply, fill #2

## 2022-01-04 ENCOUNTER — Other Ambulatory Visit (HOSPITAL_COMMUNITY): Payer: Self-pay

## 2022-01-06 ENCOUNTER — Other Ambulatory Visit (HOSPITAL_COMMUNITY): Payer: Self-pay

## 2022-01-07 ENCOUNTER — Other Ambulatory Visit (HOSPITAL_COMMUNITY): Payer: Self-pay

## 2022-01-07 MED ORDER — OMNIPOD 5 DEXG7G6 PODS GEN 5 MISC
5 refills | Status: DC
  Filled 2022-01-07: qty 15, 30d supply, fill #0

## 2022-01-07 MED ORDER — OMNIPOD 5 DEXG7G6 INTRO GEN 5 KIT
PACK | 0 refills | Status: DC
  Filled 2022-01-07: qty 1, 30d supply, fill #0

## 2022-01-08 ENCOUNTER — Encounter: Payer: Self-pay | Admitting: Professional

## 2022-01-08 ENCOUNTER — Ambulatory Visit (INDEPENDENT_AMBULATORY_CARE_PROVIDER_SITE_OTHER): Payer: 59 | Admitting: Professional

## 2022-01-08 DIAGNOSIS — F900 Attention-deficit hyperactivity disorder, predominantly inattentive type: Secondary | ICD-10-CM | POA: Diagnosis not present

## 2022-01-08 DIAGNOSIS — F4310 Post-traumatic stress disorder, unspecified: Secondary | ICD-10-CM

## 2022-01-08 DIAGNOSIS — F411 Generalized anxiety disorder: Secondary | ICD-10-CM | POA: Diagnosis not present

## 2022-01-08 DIAGNOSIS — F330 Major depressive disorder, recurrent, mild: Secondary | ICD-10-CM | POA: Diagnosis not present

## 2022-01-08 NOTE — Progress Notes (Signed)
[facility] Counselor/Therapist Progress Note  Patient ID: Lydia Estrada, MRN: [id number],    Date: 01/08/2022  Time Spent: 53 minutes 1-153pm  Treatment Type: Individual Therapy  Risk Assessment: Danger to Self:  No Self-injurious Behavior: No Danger to Others: No  Subjective: This session was held via video teletherapy. The patient consented to video teletherapy and was located at her home during this session. She is aware it is the responsibility of the patient to secure confidentiality on her end of the session. The provider was in a private home office for the duration of this session.    The patient arrived on time for her webex appointment.   Issues addressed:  1-personal a-pt has gotten a confirmation on her pregnancy -she is halfway through her pregnancy -pt will have her anatomy scan and first check-up with Ob who will deliver b-relationships -"I don't know how to trust people" -was not socialized outside of family or restrictive church members -pt's general pattern in relationships has been that she stays for a few months and then leaves the relationship -her partner Fara Olden does not help her in any way   -the pt feels overwhelmed at the thought of her keeping up with an infant and keeping up with the home. -pt frustrated with Joel's lack of investment in their home -what are Joel's positives   -he is easy to talk to   -he doesn't judge me   -he helps with her insulin shots and dosing 2-communication -how to better communicate thoughts and feelings    Treatment Plan Problems Addressed  Bipolar Disorder - Depression, Obsessive-Compulsive Disorder (OCD), Posttraumatic Stress Disorder (PTSD) Goals 1. Accept the presence of obsessive thoughts without acting on them and commit to a value-driven life. 2. Alleviate depressive symptoms and return to previous level of effective functioning. 3. Appropriately grieve the loss in order to normalize mood and to  return to previously adaptive level of functioning. 4. Develop an awareness of how childhood issues have affected and continue to affect one's family life. Objective Describe what it was like to grow up in the home environment. Target Date: 2022-12-08 Frequency: Weekly  Progress: 0 Modality: individual  Related Interventions Develop the client's family genogram and/or symptom line and help identify patterns of dysfunction within the family. Objective Acknowledge any dissociative phenomena that have resulted from childhood trauma. Target Date: 2022-12-08 Frequency: Weekly  Progress: 0 Modality: individual  Related Interventions Assist the client in understanding the role of dissociation in protecting himself/herself from the pain of childhood abusive betrayals (see the Dissociation chapter in this Planner). Assess the severity of the client's dissociation phenomena and hospitalize as necessary for his/her protection. Objective Describe each family member and identify the role each played within the family. Target Date: 2022-12-08 Frequency: Weekly  Progress: 0 Modality: individual  Related Interventions Assist the client in clarifying his/her role within the family and his/her feelings connected to that role. Objective Identify patterns of abuse, neglect, or abandonment within the family of origin, both current and historical, nuclear and extended. Target Date: 2022-12-08 Frequency: Weekly  Progress: 0 Modality: individual  Related Interventions Explore the client's painful childhood experiences (or assign "Share the Painful Memory" in the Adult Psychotherapy Homework Planner by Bryn Gulling). Objective Identify feelings associated with major traumatic incidents in childhood and with parental child-rearing patterns. Target Date: 2022-12-08 Frequency: Weekly  Progress: 0 Modality: individual  Related Interventions Support and encourage the client when he/she begins to express feelings of  rage, sadness, fear, and rejection  relating to family abuse or neglect. Assign the client to record feelings in a journal that describes memories, behavior, and emotions tied to his/her traumatic childhood experiences (or assign "How the Trauma Affects Me" in the Adult Psychotherapy Homework Planner by Bryn Gulling). Ask the client to read books on the emotional effects of neglect and abuse in childhood (e.g., It Will Never Happen to Me by [facility]; Outgrowing the Pain by Artis Delay; Healing the Child Within by [facility]); process insights attained. Objective Decrease feelings of shame by being able to verbally affirm self as not responsible for abuse. Target Date: 2022-12-08 Frequency: Weekly  Progress: 0 Modality: individual  Related Interventions Assign writing a letter to mother, father, or other abuser in which the client expresses his/her feelings regarding the abuse. Guide the client in an empty chair exercise with a key figure connected to the abuse (i.e., perpetrator, sibling, or parent); reinforce the client for placing responsibility for the abuse or neglect on the caretaker. Consistently reiterate that responsibility for the abuse falls on the abusive adults, not the surviving child (for deserving the abuse), and reinforce statements that accurately reflect placing blame on perpetrators and on nonprotective, nonnurturant adults. Objective Increase level of trust of others as shown by more socialization and greater intimacy tolerance. Target Date: 2022-12-08 Frequency: Weekly  Progress: 0 Modality: individual  Related Interventions Teach the client the share-check method of building trust in relationships (sharing a little information and checking as to the recipient's sensitivity in reacting to that information). Teach the client the advantages of treating people as trustworthy given a reasonable amount of time to assess their character. 5. Develop healthy cognitive patterns and beliefs about self and  the world that lead to alleviation and help prevent the relapse of mood episodes. 6. Develop healthy interpersonal relationships that lead to the alleviation and help prevent the relapse of depression. 7. Develop healthy thinking patterns and beliefs about self, others, and the world that lead to the alleviation and help prevent the relapse of depression. Objective Maintain a pattern of regular rhythm to daily activities. Target Date: 2022-12-08 Frequency: Weekly  Progress: 0 Modality: individual  Related Interventions Assist the client in establishing a more routine pattern of daily activities such as sleeping, eating, solitary and social activities, and exercise; use and review a form to schedule, assess, and modify these activities so that they occur in a predictable rhythm every day. Teach the client about the importance of good sleep hygiene (see "Sleep Pattern Record" in the Adult Psychotherapy Homework Planner by [facility]); assess and intervene accordingly (see the Sleep Disturbance chapter in this Planner). Engage the client in a balanced schedule of "behavioral activation" by scheduling rewarding activities while not over-stimulating; (see "Identify and Schedule Pleasant Activities" in the Adult Psychotherapy Homework Planner by [facility]); use activity and mood monitoring to facilitate an optimal balance of activity; reinforce success. Objective Describe mood state, energy level, amount of control over thoughts, and sleeping pattern. Target Date: 2022-12-08 Frequency: Weekly  Progress: 0 Modality: individual  Related Interventions Encourage the client to share his/her thoughts and feelings; express empathy and build rapport while assessing primary cognitive, behavioral, interpersonal, or other symptoms of the mood disorder. Objective Discuss and resolve troubling personal and interpersonal issues. Target Date: 2022-12-08 Frequency: Weekly  Progress: 0 Modality: individual  Related  Interventions Establish a "rescue protocol" with the client and significant others to identify and manage clinical deterioration; include medication use, sleep pattern restoration, maintaining a daily routine and conflict-free social support. Objective Differentiate between  real and imagined losses, rejections, and abandonments. Target Date: 2022-12-08 Frequency: Weekly  Progress: 0 Modality: individual  Related Interventions Help the client differentiate between real and imagined, actual and exaggerated losses. Explore the client's fears of abandonment by sources of love and nurturance. Objective Use mindfulness and acceptance strategies to reduce experiential and cognitive avoidance and increase value-based behavior. Target Date: 2022-12-08 Frequency: Weekly  Progress: 0 Modality: individual  Related Interventions Conduct Acceptance and Commitment Therapy (see ACT for Depression by [employer]) including mindfulness strategies to help the client decrease experiential avoidance, disconnect thoughts from actions, accept one's experience rather than change or control symptoms, and behave according to his/her broader life values; assist the client in clarifying his/her goals and values and commit to behaving accordingly). Objective Increasingly verbalize hopeful and positive statements regarding self, others, and the future. Target Date: 2022-12-08 Frequency: Weekly  Progress: 0 Modality: individual  Related Interventions Assign the client to write at least one positive affirmation statement daily regarding himself/herself and the future (see "Positive Self-Talk" in the Adult Psychotherapy Homework Planner by Bryn Gulling). Teach the client more about depression and how to recognize and accept some sadness as a normal variation in feeling. 8. Eliminate or reduce the negative impact trauma related symptoms have on social, occupational, and family functioning. Objective Describe in as much detail as  comfort allows the nature and history of the PTSD symptoms. Target Date: 2022-12-08 Frequency: Weekly  Progress: 0 Modality: individual  Related Interventions Gently and sensitively explore the client's recollection of the facts of the traumatic incident and his/her cognitive and emotional reactions at the time; assess frequency, intensity, duration, and history of the client's PTSD symptoms and their impact on functioning (see "How the Trauma Affects Me" in the Adult Psychotherapy Homework Planner by Jongsma); supplement with semi-structured assessment instrument if desired (see The Anxiety Disorders Interview Schedule - Adult Version). Objective Verbalize any symptoms of depression, including any suicidal thoughts. Target Date: 2022-12-08 Frequency: Weekly  Progress: 0 Modality: individual  Related Interventions Assess the client's depth of depression and suicide potential and treat appropriately, taking the necessary safety precautions as indicated (see the Suicidal Ideation chapter in this Planner). Objective Verbalize an accurate understanding of PTSD and how it develops. Target Date: 2022-12-08 Frequency: Weekly  Progress: 0 Modality: individual  Related Interventions Discuss how PTSD results from exposure to trauma; results in intrusive recollection, unwarranted fears, anxiety, and a vulnerability to other negative emotions such as shame, anger, and guilt; and results in avoidance of thoughts, feelings, and activities associated with the trauma. Objective Learn and implement calming skills. Target Date: 2022-12-08 Frequency: Weekly  Progress: 0 Modality: individual  Related Interventions Teach the client calming skills (e.g., breathing retraining, relaxation, calming self-talk) to use in and between sessions when feeling overly distressed. Objective Participate in Cognitive Therapy to help identify, challenge, and replace biased, negative, and self-defeating thoughts resulting from the  trauma. Target Date: 2022-12-08 Frequency: Weekly  Progress: 0 Modality: individual  Related Interventions Assign the client to keep a daily log of automatic thoughts (e.g., "Negative Thoughts Trigger Negative Feelings" in the Adult Psychotherapy Homework Planner by [facility]); process the journal material to challenge distorted thinking patterns with reality-based thoughts and to generate predictions for behavioral experiments. Using Cognitive Therapy techniques, explore the client's self-talk and beliefs about self, others, and the future that are a consequence of the trauma (e.g., themes of safety, trust, power, control, esteem, and intimacy); identify and challenge biases; assist him/her in generating appraisals that correct for the biases;  test biased and alternatives predictions through behavioral experiments. Objective Learn and implement approaches for addressing shame and self-disparagement. Target Date: 2022-12-08 Frequency: Weekly  Progress: 0 Modality: individual  Related Interventions Use a Compassionate Mind Training to help the client identify and change self-attacking and personal shaming resulting from the trauma (see Focused Therapies and Compassionate Mind Training for Shame and Self-Attacking by Jasmine Pang). Objective Sleep without being disturbed by dreams of the trauma. Target Date: 2022-12-08 Frequency: Weekly  Progress: 0 Modality: individual  Related Interventions Monitor the client's sleep pattern (or assign "Sleep Pattern Record" in the Adult Psychotherapy Homework Planner by [facility]) and encourage use of relaxation, positive imagery, and sleep hygiene as aids to sleep (see the Sleep Disturbance chapter in this Planner). 9. Function daily at a consistent level with minimal interference from obsessions and compulsions. 10. Let go of key thoughts, beliefs, and past life events in order to maximize time free from obsessions and compulsions. 11. No longer avoids  persons, places, activities, and objects that are reminiscent of the traumatic event. 12. No longer experiences intrusive event recollections, avoidance of event reminders, intense arousal, or disinterest in activities or relationships. 13. Normalize energy level and return to usual activities, good judgment, stable mood, more realistic expectations, and goal-directed behavior. 14. Reduce the frequency, intensity, and duration of obsessions and/or compulsions. Objective Describe the history and nature of obsessions and compulsions. Target Date: 2022-12-08 Frequency: Weekly  Progress: 0 Modality: individual  Related Interventions Assess the frequency, intensity, duration, and history of the client's obsessions and compulsions (consider using a structured interview such as The Anxiety Disorders Interview Schedule-Adult Version). Objective Keep a daily journal of obsessions, compulsions, and triggers; record thoughts, feelings, and actions taken. Target Date: 2022-12-08 Frequency: Weekly  Progress: 0 Modality: individual  Related Interventions Ask the client to self-monitor obsessions, compulsions, and triggers; record thoughts, feelings, and actions taken; routinely process the data to facilitate the accomplishment of therapeutic objectives (or assign "Analyze the Probability of a Feared Event" in the Adult Psychotherapy Homework Planner by Stephannie Li). Objective Identify and replace biased, fearful self-talk and beliefs. Target Date: 2022-12-08 Frequency: Weekly  Progress: 0 Modality: individual  Related Interventions Explore the client's biased schema and self-talk that mediate his/her obsessional fears and compulsions; assist him/her in generating thoughts that correct for the biases; use rational disputation and behavioral experiments to test fearful versus alternative predictions (see "Obsessive-Compulsive Disorder" by Jolyn Lent). Assign the client a homework exercise in which he/she  identifies fearful self-talk, identifies biases in the self-talk, generates alternatives, and tests though behavioral experiments (or assign "Journal and Replace Self-Defeating Thoughts" or "Reducing the Strength of Compulsive Behaviors" in the Adult Psychotherapy Homework Planner by [facility]); review and reinforce success, providing corrective feedback toward improvement. Objective Participate in imaginal or in vivo exposure to feared internal and/or external cues. Target Date: 2022-12-08 Frequency: Weekly  Progress: 0 Modality: individual  Related Interventions Assess the nature of any internal cues (thoughts, images, and impulses) and external cues (e.g., persons, objects, and situations) that precipitate the client's obsessions and compulsions. Assist the client in the construction of hierarchies of feared internal and external fear cues. Objective Identify situations at risk for a lapse and strategies for managing these risk situations. Target Date: 2022-12-08 Frequency: Weekly  Progress: 0 Modality: individual  Related Interventions Identify high-risk situations and rehearse the management of future situations or circumstances in which lapses could occur. Instruct the client to routinely use strategies learned in therapy (e.g., continued everyday exposure, cognitive  restructuring, problem-solving), building them into his/her life as much as possible. Develop a "coping card" or other reminder on which coping strategies and other helpful information can be kept and consulted by the client as needed (e.g., steps in problem-solving, positive coping statements, other strategies that were helpful to the client during therapy). Objective Gain insight into how childhood experiences might influence current struggles with OCD and take appropriate actions. Target Date: 2022-12-08 Frequency: Weekly  Progress: 0 Modality: individual  Related Interventions Use an insight-oriented approach to explore how  current obsessive themes (e.g., cleanliness, symmetry, aggressive impulses) may be related to unresolved developmental conflicts (e.g., psychosexual, interpersonal); process toward the goal of insight and change. 15. Release the emotions associated with past childhood/family issues, resulting in less resentment and more serenity. 16. Resolve key life conflicts and the emotional stress that fuels obsessive-compulsive behavior patterns. 65. Resolve past childhood/family issues, leading to less anger and depression, greater self-esteem, security, and confidence. 72. Talk about underlying feelings of low self-esteem or guilt and fears of rejection, dependency, and abandonment. 60. Thinks about or openly discusses the traumatic event with others without experiencing psychological or physiological distress.  Diagnosis: MDD (major depressive disorder), recurrent episode, mild (HCC)   Post traumatic stress disorder (PTSD)   Generalized anxiety disorder  Hx of Bipolar I Disorder Hx of Obsessive Compulsive Disorder  Plan:  -try and talk to Fara Olden about her needs -meet again on Monday, January 13, 2022 at 1pm.

## 2022-01-13 ENCOUNTER — Ambulatory Visit: Payer: 59 | Admitting: Professional

## 2022-01-13 ENCOUNTER — Other Ambulatory Visit (HOSPITAL_COMMUNITY): Payer: Self-pay

## 2022-01-13 DIAGNOSIS — E109 Type 1 diabetes mellitus without complications: Secondary | ICD-10-CM | POA: Diagnosis not present

## 2022-01-13 DIAGNOSIS — O99212 Obesity complicating pregnancy, second trimester: Secondary | ICD-10-CM | POA: Diagnosis not present

## 2022-01-13 DIAGNOSIS — Z3A20 20 weeks gestation of pregnancy: Secondary | ICD-10-CM | POA: Diagnosis not present

## 2022-01-13 DIAGNOSIS — Z363 Encounter for antenatal screening for malformations: Secondary | ICD-10-CM | POA: Diagnosis not present

## 2022-01-13 DIAGNOSIS — O99342 Other mental disorders complicating pregnancy, second trimester: Secondary | ICD-10-CM | POA: Diagnosis not present

## 2022-01-13 DIAGNOSIS — O24012 Pre-existing diabetes mellitus, type 1, in pregnancy, second trimester: Secondary | ICD-10-CM | POA: Diagnosis not present

## 2022-01-15 ENCOUNTER — Ambulatory Visit: Payer: 59 | Admitting: Professional

## 2022-01-15 DIAGNOSIS — R7689 Other specified abnormal immunological findings in serum: Secondary | ICD-10-CM | POA: Insufficient documentation

## 2022-01-15 DIAGNOSIS — Z3A20 20 weeks gestation of pregnancy: Secondary | ICD-10-CM | POA: Diagnosis not present

## 2022-01-15 DIAGNOSIS — O99212 Obesity complicating pregnancy, second trimester: Secondary | ICD-10-CM | POA: Diagnosis not present

## 2022-01-15 DIAGNOSIS — R768 Other specified abnormal immunological findings in serum: Secondary | ICD-10-CM | POA: Insufficient documentation

## 2022-01-15 DIAGNOSIS — Z3689 Encounter for other specified antenatal screening: Secondary | ICD-10-CM | POA: Diagnosis not present

## 2022-01-15 DIAGNOSIS — O24012 Pre-existing diabetes mellitus, type 1, in pregnancy, second trimester: Secondary | ICD-10-CM | POA: Diagnosis not present

## 2022-01-15 DIAGNOSIS — E109 Type 1 diabetes mellitus without complications: Secondary | ICD-10-CM | POA: Diagnosis not present

## 2022-01-15 DIAGNOSIS — E669 Obesity, unspecified: Secondary | ICD-10-CM | POA: Diagnosis not present

## 2022-01-15 LAB — OB RESULTS CONSOLE HIV ANTIBODY (ROUTINE TESTING): HIV: NONREACTIVE

## 2022-01-15 LAB — OB RESULTS CONSOLE ANTIBODY SCREEN: Antibody Screen: NEGATIVE

## 2022-01-15 LAB — OB RESULTS CONSOLE RPR: RPR: NONREACTIVE

## 2022-01-15 LAB — OB RESULTS CONSOLE VARICELLA ZOSTER ANTIBODY, IGG: Varicella: IMMUNE

## 2022-01-15 LAB — OB RESULTS CONSOLE PLATELET COUNT: Platelets: 314

## 2022-01-15 LAB — OB RESULTS CONSOLE RUBELLA ANTIBODY, IGM: Rubella: IMMUNE

## 2022-01-15 LAB — OB RESULTS CONSOLE HGB/HCT, BLOOD
HCT: 38 (ref 29–41)
Hemoglobin: 12.4

## 2022-01-15 LAB — OB RESULTS CONSOLE HEPATITIS B SURFACE ANTIGEN: Hepatitis B Surface Ag: NEGATIVE

## 2022-01-15 LAB — SICKLE CELL SCREEN: Sickle Cell Screen: NORMAL

## 2022-01-15 LAB — OB RESULTS CONSOLE ABO/RH: RH Type: POSITIVE

## 2022-01-15 LAB — OB RESULTS CONSOLE TSH: TSH: 1.24

## 2022-01-19 ENCOUNTER — Other Ambulatory Visit (INDEPENDENT_AMBULATORY_CARE_PROVIDER_SITE_OTHER): Payer: Self-pay | Admitting: Family

## 2022-01-19 ENCOUNTER — Other Ambulatory Visit: Payer: Self-pay | Admitting: Psychiatry

## 2022-01-20 ENCOUNTER — Other Ambulatory Visit (HOSPITAL_COMMUNITY): Payer: Self-pay

## 2022-01-20 MED ORDER — BUPROPION HCL ER (XL) 150 MG PO TB24
150.0000 mg | ORAL_TABLET | Freq: Every day | ORAL | 2 refills | Status: DC
  Filled 2022-01-20: qty 30, 30d supply, fill #0
  Filled 2022-02-21: qty 30, 30d supply, fill #1
  Filled 2022-04-01: qty 30, 30d supply, fill #2

## 2022-01-21 ENCOUNTER — Other Ambulatory Visit (HOSPITAL_COMMUNITY): Payer: Self-pay

## 2022-01-22 ENCOUNTER — Ambulatory Visit: Payer: 59 | Admitting: Professional

## 2022-01-22 DIAGNOSIS — Z794 Long term (current) use of insulin: Secondary | ICD-10-CM | POA: Diagnosis not present

## 2022-01-22 DIAGNOSIS — E109 Type 1 diabetes mellitus without complications: Secondary | ICD-10-CM | POA: Diagnosis not present

## 2022-01-22 DIAGNOSIS — E1065 Type 1 diabetes mellitus with hyperglycemia: Secondary | ICD-10-CM | POA: Diagnosis not present

## 2022-01-27 ENCOUNTER — Other Ambulatory Visit (HOSPITAL_BASED_OUTPATIENT_CLINIC_OR_DEPARTMENT_OTHER): Payer: Self-pay

## 2022-01-27 ENCOUNTER — Other Ambulatory Visit (HOSPITAL_COMMUNITY): Payer: Self-pay

## 2022-01-27 DIAGNOSIS — Z23 Encounter for immunization: Secondary | ICD-10-CM | POA: Diagnosis not present

## 2022-01-27 DIAGNOSIS — E119 Type 2 diabetes mellitus without complications: Secondary | ICD-10-CM | POA: Diagnosis not present

## 2022-01-27 DIAGNOSIS — Z3A22 22 weeks gestation of pregnancy: Secondary | ICD-10-CM | POA: Diagnosis not present

## 2022-01-27 DIAGNOSIS — Z3689 Encounter for other specified antenatal screening: Secondary | ICD-10-CM | POA: Diagnosis not present

## 2022-01-27 DIAGNOSIS — O24112 Pre-existing diabetes mellitus, type 2, in pregnancy, second trimester: Secondary | ICD-10-CM | POA: Diagnosis not present

## 2022-01-27 LAB — OB RESULTS CONSOLE GC/CHLAMYDIA
Chlamydia: NEGATIVE
Neisseria Gonorrhea: NEGATIVE

## 2022-01-27 MED ORDER — ASPIRIN 81 MG PO TBEC
81.0000 mg | DELAYED_RELEASE_TABLET | Freq: Every day | ORAL | 11 refills | Status: AC
  Filled 2022-01-27: qty 30, 30d supply, fill #0
  Filled 2022-01-28 – 2022-02-21 (×2): qty 30, 30d supply, fill #1
  Filled 2022-03-17 – 2022-04-01 (×2): qty 30, 30d supply, fill #2
  Filled 2022-05-01: qty 30, 30d supply, fill #3
  Filled 2022-06-02: qty 30, 30d supply, fill #4
  Filled 2022-07-08: qty 30, 30d supply, fill #5
  Filled 2022-08-14 (×2): qty 30, 30d supply, fill #6
  Filled 2022-09-08: qty 30, 30d supply, fill #7
  Filled 2022-12-15: qty 30, 30d supply, fill #8
  Filled 2023-01-13: qty 30, 30d supply, fill #9
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
## 2022-01-28 ENCOUNTER — Other Ambulatory Visit (INDEPENDENT_AMBULATORY_CARE_PROVIDER_SITE_OTHER): Payer: Self-pay | Admitting: Family

## 2022-01-28 ENCOUNTER — Other Ambulatory Visit (HOSPITAL_COMMUNITY): Payer: Self-pay

## 2022-01-29 ENCOUNTER — Telehealth (INDEPENDENT_AMBULATORY_CARE_PROVIDER_SITE_OTHER): Payer: 59 | Admitting: Psychiatry

## 2022-01-29 ENCOUNTER — Ambulatory Visit: Payer: 59 | Admitting: Professional

## 2022-01-29 ENCOUNTER — Encounter: Payer: Self-pay | Admitting: Psychiatry

## 2022-01-29 ENCOUNTER — Other Ambulatory Visit (HOSPITAL_COMMUNITY): Payer: Self-pay

## 2022-01-29 DIAGNOSIS — F3341 Major depressive disorder, recurrent, in partial remission: Secondary | ICD-10-CM

## 2022-01-29 DIAGNOSIS — F4310 Post-traumatic stress disorder, unspecified: Secondary | ICD-10-CM | POA: Diagnosis not present

## 2022-01-29 MED ORDER — BUPROPION HCL ER (XL) 300 MG PO TB24
300.0000 mg | ORAL_TABLET | Freq: Every day | ORAL | 2 refills | Status: DC
  Filled 2022-01-29 – 2022-02-21 (×2): qty 30, 30d supply, fill #0
  Filled 2022-04-01: qty 30, 30d supply, fill #1
  Filled 2022-05-01: qty 30, 30d supply, fill #2

## 2022-01-29 MED ORDER — SERTRALINE HCL 100 MG PO TABS
200.0000 mg | ORAL_TABLET | Freq: Every day | ORAL | 5 refills | Status: DC
  Filled 2022-01-29 – 2022-03-04 (×2): qty 60, 30d supply, fill #0
  Filled 2022-04-01: qty 60, 30d supply, fill #1
  Filled 2022-05-01: qty 60, 30d supply, fill #2
  Filled 2022-06-02: qty 60, 30d supply, fill #3
  Filled 2022-07-05: qty 60, 30d supply, fill #4
  Filled 2022-08-14 (×2): qty 60, 30d supply, fill #5

## 2022-01-29 NOTE — Patient Instructions (Signed)
Continue sertraline 200 mg daily Continue bupropion 450 mg daily Continue Buspar 10 mg three times a day Continue prazosin 2 mg at night  Next appointment- 1/24 at 2 PM

## 2022-01-30 ENCOUNTER — Other Ambulatory Visit (HOSPITAL_COMMUNITY): Payer: Self-pay

## 2022-01-30 ENCOUNTER — Other Ambulatory Visit (INDEPENDENT_AMBULATORY_CARE_PROVIDER_SITE_OTHER): Payer: Self-pay | Admitting: Family

## 2022-01-30 ENCOUNTER — Encounter (INDEPENDENT_AMBULATORY_CARE_PROVIDER_SITE_OTHER): Payer: Self-pay

## 2022-02-03 ENCOUNTER — Ambulatory Visit: Payer: 59 | Admitting: Professional

## 2022-02-04 ENCOUNTER — Other Ambulatory Visit (HOSPITAL_COMMUNITY): Payer: Self-pay

## 2022-02-04 ENCOUNTER — Other Ambulatory Visit (INDEPENDENT_AMBULATORY_CARE_PROVIDER_SITE_OTHER): Payer: Self-pay | Admitting: Family

## 2022-02-04 MED ORDER — TRESIBA FLEXTOUCH 200 UNIT/ML ~~LOC~~ SOPN
120.0000 [IU] | PEN_INJECTOR | Freq: Every day | SUBCUTANEOUS | 5 refills | Status: DC
  Filled 2022-02-04: qty 18, 30d supply, fill #0
  Filled 2022-02-21 – 2022-02-28 (×2): qty 18, 30d supply, fill #1
  Filled 2022-03-04 – 2022-04-01 (×2): qty 18, 30d supply, fill #2

## 2022-02-04 MED ORDER — BAQSIMI TWO PACK 3 MG/DOSE NA POWD
1.0000 | Freq: Once | NASAL | 2 refills | Status: AC
  Filled 2022-02-04: qty 2, 10d supply, fill #0
  Filled 2022-02-21: qty 2, 10d supply, fill #1
  Filled 2022-04-01: qty 2, 10d supply, fill #2

## 2022-02-04 MED ORDER — GVOKE HYPOPEN 1-PACK 1 MG/0.2ML ~~LOC~~ SOAJ
SUBCUTANEOUS | 2 refills | Status: AC
  Filled 2022-02-04: qty 0.2, 30d supply, fill #0
  Filled 2022-02-21: qty 0.2, 14d supply, fill #0
  Filled 2022-03-04 – 2022-05-31 (×4): qty 0.2, 14d supply, fill #1
  Filled 2022-07-05 – 2022-07-30 (×2): qty 0.2, 14d supply, fill #2

## 2022-02-05 ENCOUNTER — Other Ambulatory Visit (HOSPITAL_COMMUNITY): Payer: Self-pay

## 2022-02-06 ENCOUNTER — Other Ambulatory Visit (HOSPITAL_COMMUNITY): Payer: Self-pay

## 2022-02-08 ENCOUNTER — Other Ambulatory Visit (HOSPITAL_COMMUNITY): Payer: Self-pay

## 2022-02-10 ENCOUNTER — Other Ambulatory Visit (HOSPITAL_COMMUNITY): Payer: Self-pay

## 2022-02-11 ENCOUNTER — Other Ambulatory Visit (HOSPITAL_COMMUNITY): Payer: Self-pay

## 2022-02-12 ENCOUNTER — Telehealth (INDEPENDENT_AMBULATORY_CARE_PROVIDER_SITE_OTHER): Payer: Self-pay | Admitting: Family

## 2022-02-12 NOTE — Telephone Encounter (Signed)
  Name of who is calling: Kat from [employer] contact number: [phone]  Provider they see: Gretchen Short  Reason for call: She is calling following up on a order that was faxed over on December 11 for sensors.

## 2022-02-12 NOTE — Telephone Encounter (Signed)
Returned call. Let them know that she is no longer a patient here.

## 2022-02-15 ENCOUNTER — Other Ambulatory Visit (HOSPITAL_COMMUNITY): Payer: Self-pay

## 2022-02-21 ENCOUNTER — Other Ambulatory Visit (HOSPITAL_COMMUNITY): Payer: Self-pay

## 2022-02-21 ENCOUNTER — Other Ambulatory Visit (INDEPENDENT_AMBULATORY_CARE_PROVIDER_SITE_OTHER): Payer: Self-pay | Admitting: Family

## 2022-02-25 ENCOUNTER — Other Ambulatory Visit (HOSPITAL_COMMUNITY): Payer: Self-pay

## 2022-02-25 ENCOUNTER — Other Ambulatory Visit: Payer: Self-pay

## 2022-02-26 ENCOUNTER — Other Ambulatory Visit (HOSPITAL_COMMUNITY): Payer: Self-pay

## 2022-02-28 ENCOUNTER — Other Ambulatory Visit: Payer: Self-pay

## 2022-03-04 ENCOUNTER — Other Ambulatory Visit: Payer: Self-pay

## 2022-03-05 ENCOUNTER — Other Ambulatory Visit (HOSPITAL_BASED_OUTPATIENT_CLINIC_OR_DEPARTMENT_OTHER): Payer: Self-pay

## 2022-03-05 ENCOUNTER — Other Ambulatory Visit (HOSPITAL_COMMUNITY): Payer: Self-pay

## 2022-03-06 ENCOUNTER — Other Ambulatory Visit (HOSPITAL_BASED_OUTPATIENT_CLINIC_OR_DEPARTMENT_OTHER): Payer: Self-pay

## 2022-03-06 ENCOUNTER — Other Ambulatory Visit (HOSPITAL_COMMUNITY): Payer: Self-pay

## 2022-03-06 ENCOUNTER — Other Ambulatory Visit: Payer: Self-pay

## 2022-03-06 MED ORDER — HUMALOG KWIKPEN 200 UNIT/ML ~~LOC~~ SOPN
70.0000 [IU] | PEN_INJECTOR | Freq: Every day | SUBCUTANEOUS | 0 refills | Status: DC
  Filled 2022-03-06: qty 36, 102d supply, fill #0
  Filled 2022-03-06: qty 36, 85d supply, fill #0
  Filled 2022-03-06: qty 30, 85d supply, fill #0
  Filled 2022-03-11: qty 36, 85d supply, fill #0

## 2022-03-07 ENCOUNTER — Other Ambulatory Visit: Payer: Self-pay

## 2022-03-07 ENCOUNTER — Other Ambulatory Visit (HOSPITAL_COMMUNITY): Payer: Self-pay

## 2022-03-10 ENCOUNTER — Other Ambulatory Visit (HOSPITAL_COMMUNITY): Payer: Self-pay

## 2022-03-11 ENCOUNTER — Other Ambulatory Visit (HOSPITAL_COMMUNITY): Payer: Self-pay

## 2022-03-12 DIAGNOSIS — E109 Type 1 diabetes mellitus without complications: Secondary | ICD-10-CM | POA: Diagnosis not present

## 2022-03-12 DIAGNOSIS — Z794 Long term (current) use of insulin: Secondary | ICD-10-CM | POA: Diagnosis not present

## 2022-03-12 DIAGNOSIS — E1065 Type 1 diabetes mellitus with hyperglycemia: Secondary | ICD-10-CM | POA: Diagnosis not present

## 2022-03-13 ENCOUNTER — Other Ambulatory Visit (HOSPITAL_COMMUNITY): Payer: Self-pay

## 2022-03-13 MED ORDER — HUMALOG KWIKPEN 200 UNIT/ML ~~LOC~~ SOPN
150.0000 [IU] | PEN_INJECTOR | Freq: Every day | SUBCUTANEOUS | 0 refills | Status: DC
  Filled 2022-03-13: qty 12, 28d supply, fill #0
  Filled 2022-03-13: qty 69, 90d supply, fill #0

## 2022-03-14 ENCOUNTER — Other Ambulatory Visit: Payer: Self-pay

## 2022-03-14 ENCOUNTER — Ambulatory Visit (INDEPENDENT_AMBULATORY_CARE_PROVIDER_SITE_OTHER): Payer: Commercial Managed Care - PPO | Admitting: Family Medicine

## 2022-03-14 ENCOUNTER — Encounter: Payer: Self-pay | Admitting: Family Medicine

## 2022-03-14 ENCOUNTER — Other Ambulatory Visit (HOSPITAL_COMMUNITY): Payer: Self-pay

## 2022-03-14 VITALS — BP 121/82 | HR 83 | Wt 335.1 lb

## 2022-03-14 DIAGNOSIS — O24019 Pre-existing diabetes mellitus, type 1, in pregnancy, unspecified trimester: Secondary | ICD-10-CM

## 2022-03-14 DIAGNOSIS — O24013 Pre-existing diabetes mellitus, type 1, in pregnancy, third trimester: Secondary | ICD-10-CM | POA: Diagnosis not present

## 2022-03-14 DIAGNOSIS — F330 Major depressive disorder, recurrent, mild: Secondary | ICD-10-CM | POA: Diagnosis not present

## 2022-03-14 DIAGNOSIS — O0993 Supervision of high risk pregnancy, unspecified, third trimester: Secondary | ICD-10-CM

## 2022-03-14 DIAGNOSIS — Z3A29 29 weeks gestation of pregnancy: Secondary | ICD-10-CM | POA: Diagnosis not present

## 2022-03-14 DIAGNOSIS — O368130 Decreased fetal movements, third trimester, not applicable or unspecified: Secondary | ICD-10-CM

## 2022-03-14 DIAGNOSIS — Z23 Encounter for immunization: Secondary | ICD-10-CM

## 2022-03-14 DIAGNOSIS — O0990 Supervision of high risk pregnancy, unspecified, unspecified trimester: Secondary | ICD-10-CM | POA: Insufficient documentation

## 2022-03-14 DIAGNOSIS — F332 Major depressive disorder, recurrent severe without psychotic features: Secondary | ICD-10-CM | POA: Diagnosis not present

## 2022-03-14 DIAGNOSIS — O368120 Decreased fetal movements, second trimester, not applicable or unspecified: Secondary | ICD-10-CM

## 2022-03-14 MED ORDER — HUMALOG KWIKPEN 200 UNIT/ML ~~LOC~~ SOPN
150.0000 [IU] | PEN_INJECTOR | Freq: Every day | SUBCUTANEOUS | 3 refills | Status: DC
  Filled 2022-03-14: qty 69, 161d supply, fill #0
  Filled 2022-04-04: qty 12, 28d supply, fill #0
  Filled 2022-05-03: qty 12, 28d supply, fill #1

## 2022-03-14 MED ORDER — PROMETHAZINE HCL 25 MG PO TABS
25.0000 mg | ORAL_TABLET | Freq: Four times a day (QID) | ORAL | 1 refills | Status: AC | PRN
  Filled 2022-03-14: qty 30, 8d supply, fill #0

## 2022-03-14 NOTE — Progress Notes (Signed)
PRENATAL VISIT NOTE  Subjective:  Lydia Estrada is a 22 y.o. G2P0010 at [redacted]w[redacted]d being seen today for transferring prenatal care. Initially at [facility] and due to need to be in network, has transferred here.  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; Acanthosis nigricans, acquired; Dyspepsia; Type 1 diabetes mellitus without complication ([place]); Severe recurrent major depression without psychotic features ([place]); Attention deficit hyperactivity disorder (ADHD), predominantly inattentive type; Morbid obesity ([place]); Positive test for herpes simplex virus (HSV) antibody; and Supervision of high-risk pregnancy on their problem list.  Patient reports no complaints.   . Vag. Bleeding: None.  Movement: Present. Denies leaking of fluid.   The following portions of the patient's history were reviewed and updated as appropriate: allergies, current medications, past family history, past medical history, past social history, past surgical history and problem list.   Objective:   Vitals:   03/14/22 1025  BP: 121/82  Pulse: 83  Weight: (!) 335 lb 1.6 oz (152 kg)    Fetal Status: Fetal Heart Rate (bpm): 126 Fundal Height: 33 cm Movement: Present     General:  Alert, oriented and cooperative. Patient is in no acute distress.  Skin: Skin is warm and dry. No rash noted.   Cardiovascular: Normal heart rate noted  Respiratory: Normal respiratory effort, no problems with respiration noted  Abdomen: Soft, gravid, appropriate for gestational age.  Pain/Pressure: Present     Pelvic: Cervical exam deferred        Extremities: Normal range of motion.  Edema: None  Mental Status: Normal mood and affect. Normal behavior. Normal judgment and thought content.  NST:  Baseline: 140 bpm, Variability: Good {> 6 bpm), Accelerations: Reactive, and Decelerations: Absent  Assessment and Plan:  Pregnancy: G2P0010 at [redacted]w[redacted]d 1. Supervision of  high risk pregnancy in third trimester 28 wk labs today - CBC - HIV antibody (with reflex) - RPR - [place] MFM OB DETAIL +14 WK; Future - Ambulatory referral to Ophthalmology - Tdap vaccine greater than or equal to 7yo IM  2. Type 1 diabetes mellitus affecting pregnancy, antepartum Reports good control, last A1C is 6.9--will repeat this today Reports CBGs > 400 recently and poor fetal movement. Needs u/s for growth and fetal echo. She carb counts and does sliding scale for meal coverage. On metformin and Tresiba. No true lows. - Hemoglobin A1c - Ambulatory referral to Pediatric Cardiology - insulin lispro (HUMALOG KWIKPEN) 200 UNIT/ML KwikPen; Inject 150 Units into the skin daily. Carb counting sliding scale  Dispense: 69 mL; Refill: 3  3. Morbid obesity ([place])   4. Severe recurrent major depression without psychotic features ([place]) On Zoloft and Wellbutrin  Preterm labor symptoms and general obstetric precautions including but not limited to vaginal bleeding, contractions, leaking of fluid and fetal movement were reviewed in detail with the patient. Please refer to After Visit Summary for other counseling recommendations.   Return in 1 week (on 03/21/2022) for [facility], OB visit and NST.  Future Appointments  Date Time Provider [place]  03/20/2022  2:55 PM Griffin Basil, MD [facility]  03/26/2022  2:00 PM Norman Clay, MD [facility] None  04/03/2022  9:35 AM Danielle Rankin [facility]  04/10/2022  1:30 PM [facility] NURSE [facility]  04/10/2022  1:45 PM [facility] US5 [facility]  04/18/2022  8:55 AM Donnamae Jude, MD [facility]  05/01/2022  8:55 AM Donnamae Jude, MD [facility]  05/08/2022  8:55 AM Donnamae Jude, MD [facility]  05/15/2022  8:55 AM Aletha Halim, MD [facility]  05/22/2022  8:55 AM Griffin Basil, MD [facility]  05/28/2022  8:55 AM Aletha Halim, MD [facility]    Donnamae Jude, MD

## 2022-03-15 LAB — CBC
Hematocrit: 35.9 % (ref 34.0–46.6)
Hemoglobin: 12.2 g/dL (ref 11.1–15.9)
MCH: 27.5 pg (ref 26.6–33.0)
MCHC: 34 g/dL (ref 31.5–35.7)
MCV: 81 fL (ref 79–97)
Platelets: 305 10*3/uL (ref 150–450)
RBC: 4.44 x10E6/uL (ref 3.77–5.28)
RDW: 12.2 % (ref 11.7–15.4)
WBC: 13.5 10*3/uL — ABNORMAL HIGH (ref 3.4–10.8)

## 2022-03-15 LAB — HEMOGLOBIN A1C
Est. average glucose Bld gHb Est-mCnc: 169 mg/dL
Hgb A1c MFr Bld: 7.5 % — ABNORMAL HIGH (ref 4.8–5.6)

## 2022-03-15 LAB — RPR: RPR Ser Ql: NONREACTIVE

## 2022-03-15 LAB — HIV ANTIBODY (ROUTINE TESTING W REFLEX): HIV Screen 4th Generation wRfx: NONREACTIVE

## 2022-03-17 ENCOUNTER — Other Ambulatory Visit (HOSPITAL_COMMUNITY): Payer: Self-pay

## 2022-03-17 ENCOUNTER — Other Ambulatory Visit: Payer: Self-pay

## 2022-03-17 ENCOUNTER — Telehealth: Payer: Self-pay

## 2022-03-17 NOTE — Addendum Note (Signed)
Addended by: Louisa Second E on: 03/17/2022 10:34 AM   Modules accepted: Orders

## 2022-03-17 NOTE — Telephone Encounter (Signed)
Referral placed by provider was cancelled; front office unable to work this referral. [place] office notified via inbasket message to fax referral records to [facility] for fetal echo.

## 2022-03-20 ENCOUNTER — Ambulatory Visit (INDEPENDENT_AMBULATORY_CARE_PROVIDER_SITE_OTHER): Payer: Commercial Managed Care - PPO | Admitting: Obstetrics and Gynecology

## 2022-03-20 ENCOUNTER — Other Ambulatory Visit: Payer: Self-pay

## 2022-03-20 VITALS — BP 133/72 | HR 95 | Wt 337.1 lb

## 2022-03-20 DIAGNOSIS — F332 Major depressive disorder, recurrent severe without psychotic features: Secondary | ICD-10-CM

## 2022-03-20 DIAGNOSIS — Z3A30 30 weeks gestation of pregnancy: Secondary | ICD-10-CM

## 2022-03-20 DIAGNOSIS — O24013 Pre-existing diabetes mellitus, type 1, in pregnancy, third trimester: Secondary | ICD-10-CM

## 2022-03-20 DIAGNOSIS — O0933 Supervision of pregnancy with insufficient antenatal care, third trimester: Secondary | ICD-10-CM

## 2022-03-20 DIAGNOSIS — O0993 Supervision of high risk pregnancy, unspecified, third trimester: Secondary | ICD-10-CM

## 2022-03-20 NOTE — Progress Notes (Signed)
   PRENATAL VISIT NOTE  Subjective:  Lydia Estrada is a 22 y.o. G2P0010 at [redacted]w[redacted]d being seen today for ongoing prenatal care.  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; Acanthosis nigricans, acquired; Dyspepsia; Type 1 diabetes mellitus without complication ([name]); Severe recurrent major depression without psychotic features ([name]); Attention deficit hyperactivity disorder (ADHD), predominantly inattentive type; Morbid obesity ([name]); Positive test for herpes simplex virus (HSV) antibody; Supervision of high-risk pregnancy; Type 1 diabetes mellitus during pregnancy, third trimester; and Late prenatal care affecting pregnancy in third trimester on their problem list.  Patient doing well with no acute concerns today. She reports no complaints.  Contractions: Irritability. Vag. Bleeding: None.  Movement: Present. Denies leaking of fluid.   The following portions of the patient's history were reviewed and updated as appropriate: allergies, current medications, past family history, past medical history, past social history, past surgical history and problem list. Problem list updated.  Objective:   Vitals:   03/20/22 1516  BP: 133/72  Pulse: 95  Weight: (!) 337 lb 1.6 oz (152.9 kg)    Fetal Status: Fetal Heart Rate (bpm): 147 Fundal Height: 31 cm Movement: Present     General:  Alert, oriented and cooperative. Patient is in no acute distress.  Skin: Skin is warm and dry. No rash noted.   Cardiovascular: Normal heart rate noted  Respiratory: Normal respiratory effort, no problems with respiration noted  Abdomen: Soft, gravid, appropriate for gestational age.  Pain/Pressure: Present     Pelvic: Cervical exam deferred        Extremities: Normal range of motion.  Edema: Trace  Mental Status:  Normal mood and affect. Normal behavior. Normal judgment and thought content.   Assessment and Plan:  Pregnancy: G2P0010  at [redacted]w[redacted]d  1. [redacted] weeks gestation of pregnancy   2. Supervision of high risk pregnancy in third trimester Continue routine prenatal care  3. Morbid obesity ([place])   4. Type 1 diabetes mellitus during pregnancy, third trimester PT did not bring in blood sugars today.  Notes fasting blood sugars have been around 180.  PPBS have been in low 200's.  She will see new endocrinologist on Monday and will have her insulin adjusted then. She is still scheduling the fetal echo. Pt needs to schedule with ophthalmologist. Will start weekly BPP/NST at 32 weeks. Detailed scan with MFM on 04/08/21  5. Severe recurrent major depression without psychotic features ([name]) Pt continues with current meds  6. Late prenatal care affecting pregnancy in third trimester   Preterm labor symptoms and general obstetric precautions including but not limited to vaginal bleeding, contractions, leaking of fluid and fetal movement were reviewed in detail with the patient.  Please refer to After Visit Summary for other counseling recommendations.   Return in about 1 week (around 03/27/2022) for [name], in person.   Lynnda Shields, MD Faculty Attending Center for [facility]

## 2022-03-20 NOTE — Progress Notes (Signed)
Pt states has been having more severe abdominal pain that shoots from front to back.

## 2022-03-24 ENCOUNTER — Other Ambulatory Visit (HOSPITAL_COMMUNITY): Payer: Self-pay

## 2022-03-24 ENCOUNTER — Other Ambulatory Visit: Payer: Self-pay

## 2022-03-24 DIAGNOSIS — Z331 Pregnant state, incidental: Secondary | ICD-10-CM | POA: Diagnosis not present

## 2022-03-24 DIAGNOSIS — E1065 Type 1 diabetes mellitus with hyperglycemia: Secondary | ICD-10-CM | POA: Diagnosis not present

## 2022-03-24 MED ORDER — HUMALOG KWIKPEN 200 UNIT/ML ~~LOC~~ SOPN
30.0000 [IU] | PEN_INJECTOR | Freq: Three times a day (TID) | SUBCUTANEOUS | 3 refills | Status: DC
  Filled 2022-03-24 – 2022-04-01 (×2): qty 18, 24d supply, fill #0
  Filled 2022-05-01: qty 21, 38d supply, fill #0
  Filled 2022-05-05 – 2022-05-08 (×2): qty 12, 28d supply, fill #0

## 2022-03-24 MED ORDER — GVOKE HYPOPEN 2-PACK 1 MG/0.2ML ~~LOC~~ SOAJ
SUBCUTANEOUS | 1 refills | Status: DC
  Filled 2022-03-24: qty 0.4, 7d supply, fill #0
  Filled 2022-05-01 – 2022-08-14 (×4): qty 0.4, 30d supply, fill #0
  Filled 2022-09-08: qty 0.4, 30d supply, fill #1

## 2022-03-25 ENCOUNTER — Other Ambulatory Visit: Payer: Self-pay

## 2022-03-25 ENCOUNTER — Encounter: Payer: Self-pay | Admitting: Pharmacist
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
## 2022-03-26 ENCOUNTER — Other Ambulatory Visit: Payer: Self-pay

## 2022-03-26 ENCOUNTER — Encounter: Payer: Self-pay | Admitting: Medical

## 2022-03-26 ENCOUNTER — Other Ambulatory Visit (HOSPITAL_COMMUNITY): Payer: Self-pay

## 2022-03-26 ENCOUNTER — Telehealth: Payer: Commercial Managed Care - PPO | Admitting: Psychiatry

## 2022-03-26 ENCOUNTER — Ambulatory Visit (INDEPENDENT_AMBULATORY_CARE_PROVIDER_SITE_OTHER): Payer: Commercial Managed Care - PPO | Admitting: Medical

## 2022-03-26 ENCOUNTER — Encounter: Payer: Self-pay | Admitting: Psychiatry

## 2022-03-26 VITALS — BP 119/106 | HR 109 | Wt 332.0 lb

## 2022-03-26 DIAGNOSIS — Z3A30 30 weeks gestation of pregnancy: Secondary | ICD-10-CM

## 2022-03-26 DIAGNOSIS — R768 Other specified abnormal immunological findings in serum: Secondary | ICD-10-CM

## 2022-03-26 DIAGNOSIS — F4310 Post-traumatic stress disorder, unspecified: Secondary | ICD-10-CM

## 2022-03-26 DIAGNOSIS — F332 Major depressive disorder, recurrent severe without psychotic features: Secondary | ICD-10-CM

## 2022-03-26 DIAGNOSIS — O0933 Supervision of pregnancy with insufficient antenatal care, third trimester: Secondary | ICD-10-CM

## 2022-03-26 DIAGNOSIS — O0993 Supervision of high risk pregnancy, unspecified, third trimester: Secondary | ICD-10-CM

## 2022-03-26 DIAGNOSIS — O24013 Pre-existing diabetes mellitus, type 1, in pregnancy, third trimester: Secondary | ICD-10-CM

## 2022-03-26 DIAGNOSIS — F3341 Major depressive disorder, recurrent, in partial remission: Secondary | ICD-10-CM

## 2022-03-26 NOTE — Patient Instructions (Addendum)
AREA PEDIATRIC/FAMILY PRACTICE PHYSICIANS  Central/Southeast [place] ([address]) [facility] Chambliss, MD; Eniola, MD; Hale, MD; Hensel, MD; McDiarmid, MD; McIntyer, MD; Neal, MD; Walden, MD [address] [place], [place] [address] [phone] Mon-Fri 8:30-12:30, 1:30-5:00 Providers come to see babies at [facility] Accepting Medicaid [facility] providers who accept newborns: Koirala, MD; Morrow, MD; Wolters, MD [address], [place], [place] [address] [phone] Mon-Fri 8:00-5:30 Babies seen by providers at [facility] Does NOT accept Medicaid Please call early in hospitalization for appointment (limited availability)  [facility] Mulberry, MD [address] [place], [place] [address] [phone] Mon, Tue, Thur, Fri 8:30-5:00, Wed 10:00-7:00 (closed 1-2pm) Babies seen by [facility] providers Accepting Medicaid Rubin - Pediatrician Rubin, MD [address], [place], [place] [address] [phone] Mon-Fri 8:30-5:00, Sat 8:30-12:00 Provider comes to see babies at [facility] Accepting Medicaid Must have been referred from current patients or contacted office prior to delivery [facility] ([facility]) Brown, MD; Chandler, MD; Ettefagh, MD; Grant, MD; Lester, MD; McCormick, MD; McQueen, MD; Prose, MD; Simha, MD; Stanley, MD; Stryffeler, NP; Tebben, NP [address], [place], [place] [address] [phone] Mon, Tue, Thur, Fri 8:30-5:30, Wed 9:30-5:30, Sat 8:30-12:30 Babies seen by [facility] providers Accepting Medicaid Only accepting infants of first-time parents or siblings of current patients Hospital discharge coordinator will make follow-up appointment Jack Amos [address], [place], [place]  [address] [phone]   Fax - [phone] [facility] [address], [place], [place]  [address] Phone - [phone]   Fax - [phone] Shilpa Gosrani [address], [place], [place]  [address] [phone]  East/Northeast [place] ([address]) [facility] Bates, MD; [place], MD; Cooper, Cox, MD; MD; Davis, MD; Dovico, MD; Ettefaugh, MD; Little, MD; Lowe, MD; Keiffer, MD; Melvin, MD; Sumner, MD; Williams, MD [address], [place], [place] [address] [phone] Mon-Fri 8:30-5:00 (extended evenings Mon-Thur as needed), Sat-Sun 10:00-1:00 Providers come to see babies at [facility] Accepting Medicaid for families of first-time babies and families with all children in the household age 3 and under. Must register with office prior to making appointment (M-F only). [facility] Henson, NP; Knapp, MD; Lalonde, MD; Tysinger, PA [address] [place], [place] [address] [phone] Mon-Fri 8:00-5:00 Babies seen by providers at [facility] Does NOT accept Medicaid/Commercial Insurance Only [facility] ([facility])  Artis, MD; Barnes, MD; Bratton, MD; Coccaro, MD; Lockett Gardner, MD; Kramer, MD; Marshall, MD; Netherton, MD; Poleto, MD; Skinner, MD [address] [place], [place] [address] [phone] Mon-Fri 8:30-5:30, Sat (Oct.-Mar.) 9:00-1:00 Babies seen by providers at [facility] Accepting Medicaid  [place] ([address]) [facility] Reid, MD; Warner, MD [address], [place], [place] [address] [phone] Mon-Fri 8:30-5:00, Sat 8:30-12:00 Providers come to see babies at [facility] Does NOT accept Medicaid [facility] Becker, PA; Hagler, MD; Scifres, PA; Sun, MD; Swayne, MD [address], [place],  [address] [phone] Mon-Fri 8:00-5:00 Babies seen by providers at [facility] Does NOT accept Medicaid Only accepting babies of parents who  are patients Please call early in hospitalization for appointment (limited availability) [facility] Clark, MD; Frye, MD; Kelleher, MD; Mack, NP; Miller, MD; O'Keller, MD; Patterson, NP; Pudlo, MD; Puzio, MD; Thomas, MD; Tucker, MD; Twiselton, MD [address], [place], [place] [address] [phone] Mon-Fri 8:00-5:00, Sat 9:00-12:00 Providers come to see babies at [facility] Does NOT accept Medicaid  [place] ([address]) [facility] providers accepting new patients: Brake, NP; Wharton, PA [address], [place], [place] [address] [phone] Mon-Fri 8:00-5:00 Babies seen by providers at [facility] Does NOT accept Medicaid Only accepting babies of parents who are patients Please call early in hospitalization for appointment (limited availability) [facility] Gay, MD; Quinlan, MD [address] [place], [place] [address] [phone] (press 1 to schedule appointment) Mon-Fri 8:00-5:00 Providers come to see babies at [facility] Does NOT accept Medicaid [facility] Mazer, MD [address] [place], [place] [address] [phone] Mon-Fri 8:30-5:00 (lunch 12:30-1:00), extended hours by appointment only Wed 5:00-6:30 Babies seen by [facility] providers Accepting Medicaid [facility] Banks, MD; Jordan, MD; Koberlein, MD [address], [place], [place] [address] [phone] Mon-Fri 8:00-5:00 Babies seen by [facility] providers Does NOT accept Medicaid [facility] Parker, MD; Hunter, MD; Wallace, DO [address] [place], [place] [address] [phone] Mon-Fri 8:00-5:00 Babies seen by [facility] providers Does NOT accept Medicaid [facility] Brandon, PA; Brecken, PA; Christy, NP; Dees, MD; DeClaire, MD; DeWeese, MD; Hansen, NP; Mills, NP; Parrish, NP; Smoot, NP; Summer, MD; Vapne,  MD [address] [place], [place] [address] [phone] Mon-Fri 8:30-5:00, Sat 10:00-1:00 Providers come to see babies at [facility] Does NOT accept Medicaid Free prenatal information session Tuesdays at 4:45pm [facility] Bouska, MD; Gordon, PA; Jeffery, PA; Weber, PA [address] [place] [address] [phone] Mon-Fri 7:30-5:30 Babies seen by [facility] providers [facility] [address], [place]  [address] [phone]   Fax - [phone]  [place] ([address] & [address]) [facility] [place] [address] [place], [place] [address] [phone] Mon-Thur 8:00-6:00 Providers come to see babies at [facility] Accepting Medicaid [facility] Anderson, NP; Badger, MD; Beal, PA; Spencer, PA [address] [place], [place] [address] [phone] Mon-Thur 7:30-7:30, Fri 7:30-4:30 Babies seen by [facility] providers Accepting Medicaid [facility] Agbuya, MD; Klett, NP; Romgoolam, MD [address], [place], [place] [address] [phone] Mon-Fri 8:30-5:00, Sat 8:30-12:00 Providers come to see babies at [facility] Accepting Medicaid Must have "Meet & Greet" appointment at office prior to delivery [facility] ([facility]) McCord, MD; Wallace, MD; Wood, MD [address], [place], [place] [address] [phone] Mon-Wed 8:00-6:00, Thur-Fri 8:00-5:00, Sat 9:00-12:00 Providers come to see babies at [facility] Does NOT accept Medicaid Only accepting siblings of current patients [facility]  [address], [place], [place]  [address] [phone]   Fax - [phone] [facility] [address], [place], [place]  [address] [phone]   Fax -  [phone]  [place]west  ([address] & [address])  [facility] Cirigliano, DO; Matthews, DO [address] , [place] [address] [phone] Mon-Fri 7:00-5:00 Babies seen by [facility] providers Does NOT accept Medicaid [facility] Briscoe, MD; Howley, PA; Moreira, PA [address], [place], [place] [address] [phone] Mon-Fri 8:00-5:00 Babies seen by [facility] providers Accepting Medicaid [facility] Boyd, MD; Church, PA; Jones, NP; Osborn, PA [address], ,  [address] [phone] Mon-Fri 8:00-5:00 Babies seen by providers at [facility] Accepting Medicaid  [place] ([address]) [facility] [place] Wendling, DO [address] [place], [place] [address] [phone] Mon-Fri 8:00-5:00 Babies seen by [facility] providers Does NOT accept Medicaid Limited availability, please call early in hospitalization to schedule follow-up [facility] Calderon, PA; Cummings, MD; Dillard, MD; Martin, PA; Olson, MD; VanDeven, PA [address], [place], [place] [address] [phone] Mon-Fri 8:30-5:00, Sat 9:00-12:00 Babies seen by providers at [facility] Accepting Medicaid Please register online then schedule online or call office [email or web address] ([facility]) Hunter, NP; Kumar, MD; Martin Rogers, PA [address], [place], [place] [address] [phone] Mon-Fri 8:00-5:00 Babies seen by providers at [facility] Accepting Medicaid [facility] ([facility]) [place], MD; Kristi Fleenor, NP; West, MD [address], [place], [place] [address] [phone] Mon-Fri 8:00-5:30, Sat&Sun by appointment (phones open at  8:30) Babies seen by [facility] providers Accepting Medicaid Must be a first-time baby or sibling of current patient [facility] [place]  [address], [place], [place]  [address] [phone]   Fax - [phone]  [place] ([address]) [facility] Brown, PA; Cowen, PA; Rice, MD; Helton, PA; Spry, MD [address] [place], [place] [address] [phone] Mon-Thur 8:00-7:00, Fri 8:00-5:00, Sat 8:00-12:00, Sun 9:00-12:00 Babies seen by [facility] providers Accepting Medicaid [facility] [place] Coe-Goins, MD; [place], MD; Pierre-Louis, MD [address] [place], [place], [place] [address] [phone] Mon-Thur 8:00-5:00 Babies seen by providers at [facility] Accepting Medicaid [facility] Bratton, MD; Coe-Goins, MD; Hayes, MD; Lewis, MD; List, MD; Lott, MD; [place], MD; Moran, MD; O'Neal, MD; Pierre-Louis, MD; Pitonzo, MD; Scholer, MD; Spangle, MD [address] [place], [place] [address] [phone] Mon-Fri 8:00-5:30, Sat (Oct.-Mar.) 9:00-1:00 Babies seen by providers at [facility] Accepting Medicaid Must fill out new patient packet, available online at [email or web address] [facility] ([facility]) Friddle, NP; Harris, NP; Kelly, NP; Logan, MD; Melvin, PA; Poth, MD; Ramadoss, MD; Stanton, NP [address], [place], [place] [address] [phone] Mon-Thur 8:00-5:30, Fri 8:00-5:00 Babies seen by providers at [facility] Accepting Medicaid  [place] ([address]) [facility] Dixon, PA; [place]. [place], MD; Pickard, MD; Tapia, PA [address], [place], [place] [address] [phone] Mon-Fri 8:00-5:00 Babies seen by providers at [facility] Accepting Medicaid   [place] ([address]) [facility] [place] Masneri, DO; Meyers, MD; Nelson, PA [address], [place], [place] [address] [phone] Mon-Fri 8:00-5:00 Babies seen by providers at [facility] Does NOT accept Medicaid Limited appointment availability, please call early in hospitalization   [facility] [place] Kunedd, DO; McGowen, MD [address], [place], [place] [address] [phone] Mon-Fri 8:00-5:00 Babies seen by [facility] providers Does NOT accept Medicaid [facility] [place] Cameron, MD; MacDonald, MD; Michaels, PA; Nayak, MD [address] [place] [address], [place],  [address] [phone] Mon-Fri 8:00-5:00 After hours clinic ([address] [place],  [address]) [phone] Mon-Fri 5:00-8:00, Sat 12:00-6:00, Sun 10:00-4:00 Babies seen by [facility] providers Accepting Medicaid [facility] [place] [address], [place], [place]  [address] [phone]   Fax - [phone]  [place] [phone]) [place] [facility], [address] [place], [place], [place] [address] [phone] Mon-Fri 8:00-5:00 Babies seen by [facility] providers Does NOT accept Medicaid [place] [facility]) Daron Offer, [place] [address], [place], [place] [address] [phone] Mon-Thur 8:00-7:00, Fri 8:00-5:00, Sat 8:00-12:00 Babies seen by providers at [facility] Accepting Medicaid - but does not have vaccinations in office (must be received elsewhere) Limited availability, please call early in hospitalization  [place] [phone]) [place], MD [address] [place] [address] [phone]  Fax [phone]  [facility]  Lauretta Chester, MD, [place], [place], [place], [place], [place], [place]  [address] [facility]  [place], [place], [place] [address] [address], [place], [place] [address] [phone] ([place])  [facility] [address], [place], [place] [address] [place], [place], [place] [address] [phone] [facility] [address], [place], [place], [place] [address] [phone] [facility] [address], [place], [place] [address] [place], [place], [place] [address] [facility] [address] [place], [place] [address] [phone] [place]. [address], [place], [place] [address] [phone] Dr. Okey Regal. [place] [address], [place], [place] [address] [phone] [facility] [address], [place] [address], [place],  [address] [facility] [address] [place],  [address] [phone]   Childbirth Education Options: [facility] Department Classes:  Childbirth education classes can help you get ready for a positive parenting experience. You can also meet other expectant parents and get free stuff for your baby. Each class runs for five weeks on the same night and costs $45 for the mother-to-be and her support person. Medicaid covers the cost if you are eligible. Call [phone] to register. [place] Childbirth Education: Classes can vary in availability and schedule is subject to change. For most up-to-date information please visit [email or web address] to review and register.

## 2022-03-26 NOTE — Progress Notes (Signed)
PRENATAL VISIT NOTE  Subjective:  Lydia Estrada is a 22 y.o. G2P0010 at [redacted]w[redacted]d being seen today for ongoing prenatal care.  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  Patient reports  occasional round ligament and low back pain .  Contractions: Not present. Vag. Bleeding: None.  Movement: Present. Denies leaking of fluid.   The following portions of the patient's history were reviewed and updated as appropriate: allergies, current medications, past family history, past medical history, past social history, past surgical history and problem list.   Objective:   Vitals:   03/26/22 1548  BP: (!) 119/106  Pulse: (!) 109  Weight: (!) 332 lb (150.6 kg)    Fetal Status: Fetal Heart Rate (bpm): 150   Movement: Present     General:  Alert, oriented and cooperative. Patient is in no acute distress.  Skin: Skin is warm and dry. No rash noted.   Cardiovascular: Normal heart rate noted  Respiratory: Normal respiratory effort, no problems with respiration noted  Abdomen: Soft, gravid, appropriate for gestational age.  Pain/Pressure: Present     Pelvic: Cervical exam deferred        Extremities: Normal range of motion.  Edema: None  Mental Status: Normal mood and affect. Normal behavior. Normal judgment and thought content.   Assessment and Plan:  Pregnancy: G2P0010 at [redacted]w[redacted]d 1. Supervision of high  risk pregnancy in third trimester - Peds list given and importance of choosing Peds prior to delivery discussed  - [place] scheduled 2/8 - Considering IUD for MOC  2. Type 1 diabetes mellitus during pregnancy, third trimester - Has Dexcom, saw endocrine on 1/22, meds were adjusted values on Dexcom today are better than prior to adjustment  - Next endocrine appt 2/8  3. Severe recurrent major depression without psychotic features ([place]) - Has established psychiatry   4. Morbid obesity ([place])  5. Late prenatal care affecting pregnancy in third trimester  6. HSV-2 seropositive  7. Round ligament pain  - recommended pregnancy belt, hydrotherapy, heat for back pain and Tyleno   8. [redacted] weeks gestation of pregnancy  Preterm labor symptoms and general obstetric precautions including but not limited to vaginal bleeding, contractions, leaking of fluid and fetal movement were reviewed in detail with the patient. Please refer to After Visit Summary for other counseling recommendations.   Return in about 2 weeks (around 04/09/2022) for [facility] MD only, In-Person.  Future Appointments  Date Time Provider [place]  04/03/2022 11:15 AM [facility] NST [facility]  04/10/2022  1:30 PM [facility] NURSE [facility]  04/10/2022  1:45 PM [facility] US5 [facility]  04/18/2022  8:55 AM Donnamae Jude, MD [facility]  05/01/2022  8:55 AM Donnamae Jude, MD [facility]  05/08/2022  8:55 AM Donnamae Jude, MD [facility]  05/15/2022  8:55 AM Aletha Halim, MD [facility]  05/22/2022  8:55 AM Griffin Basil, MD [facility]  05/23/2022  9:00 AM Norman Clay, MD [facility] None  05/28/2022  8:55 AM Aletha Halim, MD [facility]  Kerry Hough, PA-C

## 2022-03-26 NOTE — Patient Instructions (Signed)
Continue sertraline 200 mg daily Continue bupropion 450 mg daily Continue Buspar 10 mg three times a day Continue prazosin 2 mg at night  Next appointment- 3/22 at 9 AM

## 2022-03-31 ENCOUNTER — Other Ambulatory Visit: Payer: Self-pay

## 2022-04-01 ENCOUNTER — Other Ambulatory Visit (HOSPITAL_COMMUNITY): Payer: Self-pay

## 2022-04-01 ENCOUNTER — Other Ambulatory Visit (INDEPENDENT_AMBULATORY_CARE_PROVIDER_SITE_OTHER): Payer: Self-pay | Admitting: Family

## 2022-04-01 ENCOUNTER — Other Ambulatory Visit: Payer: Self-pay

## 2022-04-03 ENCOUNTER — Ambulatory Visit (INDEPENDENT_AMBULATORY_CARE_PROVIDER_SITE_OTHER): Payer: Commercial Managed Care - PPO

## 2022-04-03 ENCOUNTER — Encounter: Payer: Self-pay | Admitting: Medical

## 2022-04-03 ENCOUNTER — Ambulatory Visit: Payer: Commercial Managed Care - PPO | Admitting: *Deleted

## 2022-04-03 ENCOUNTER — Other Ambulatory Visit: Payer: Commercial Managed Care - PPO

## 2022-04-03 ENCOUNTER — Other Ambulatory Visit: Payer: Self-pay

## 2022-04-03 VITALS — BP 117/69 | HR 88 | Wt 333.8 lb

## 2022-04-03 DIAGNOSIS — Z3A00 Weeks of gestation of pregnancy not specified: Secondary | ICD-10-CM | POA: Diagnosis not present

## 2022-04-03 DIAGNOSIS — O99213 Obesity complicating pregnancy, third trimester: Secondary | ICD-10-CM | POA: Diagnosis not present

## 2022-04-03 DIAGNOSIS — O24013 Pre-existing diabetes mellitus, type 1, in pregnancy, third trimester: Secondary | ICD-10-CM | POA: Diagnosis not present

## 2022-04-03 NOTE — Progress Notes (Signed)

## 2022-04-04 ENCOUNTER — Other Ambulatory Visit: Payer: Self-pay

## 2022-04-04 ENCOUNTER — Other Ambulatory Visit (INDEPENDENT_AMBULATORY_CARE_PROVIDER_SITE_OTHER): Payer: Self-pay | Admitting: Family

## 2022-04-04 ENCOUNTER — Other Ambulatory Visit (HOSPITAL_COMMUNITY): Payer: Self-pay

## 2022-04-07 ENCOUNTER — Other Ambulatory Visit: Payer: Self-pay

## 2022-04-07 NOTE — Progress Notes (Signed)
Alert received through NCNotify system as follows: Patient identified as having high BP reading (119/106) on 03/26/2022  Chart review: Blood pressure was rechecked on 04/03/2022. BP was 117/69.   No action needed.  Martina Sinner, RN 04/07/2022  9:39 AM

## 2022-04-10 ENCOUNTER — Encounter: Payer: Self-pay | Admitting: Family Medicine

## 2022-04-10 ENCOUNTER — Encounter: Payer: Self-pay | Admitting: *Deleted

## 2022-04-10 ENCOUNTER — Ambulatory Visit: Payer: Commercial Managed Care - PPO | Admitting: *Deleted

## 2022-04-10 ENCOUNTER — Other Ambulatory Visit: Payer: Self-pay | Admitting: *Deleted

## 2022-04-10 ENCOUNTER — Ambulatory Visit: Payer: Commercial Managed Care - PPO | Attending: Family Medicine

## 2022-04-10 DIAGNOSIS — O0993 Supervision of high risk pregnancy, unspecified, third trimester: Secondary | ICD-10-CM | POA: Diagnosis not present

## 2022-04-10 DIAGNOSIS — E1065 Type 1 diabetes mellitus with hyperglycemia: Secondary | ICD-10-CM | POA: Diagnosis not present

## 2022-04-10 DIAGNOSIS — O24013 Pre-existing diabetes mellitus, type 1, in pregnancy, third trimester: Secondary | ICD-10-CM

## 2022-04-10 DIAGNOSIS — O99213 Obesity complicating pregnancy, third trimester: Secondary | ICD-10-CM | POA: Diagnosis not present

## 2022-04-10 DIAGNOSIS — Z3A33 33 weeks gestation of pregnancy: Secondary | ICD-10-CM

## 2022-04-10 DIAGNOSIS — E109 Type 1 diabetes mellitus without complications: Secondary | ICD-10-CM | POA: Diagnosis not present

## 2022-04-10 DIAGNOSIS — E669 Obesity, unspecified: Secondary | ICD-10-CM | POA: Diagnosis not present

## 2022-04-10 DIAGNOSIS — Z331 Pregnant state, incidental: Secondary | ICD-10-CM | POA: Diagnosis not present

## 2022-04-10 DIAGNOSIS — O3660X0 Maternal care for excessive fetal growth, unspecified trimester, not applicable or unspecified: Secondary | ICD-10-CM | POA: Insufficient documentation

## 2022-04-11 ENCOUNTER — Telehealth: Payer: Self-pay

## 2022-04-11 NOTE — Telephone Encounter (Signed)
Pt called office stating she was having pain on L side of abdomen. She states it has gotten better in the last hour. She has taken Tylenol and sat in a warm bath. Pt denies vaginal bleeding, leaking of fluid, endorses positive fetal movement. I encouraged pt to report to [facility] should her pain increase, experience vaginal bleeding, leaking of fluid, or decreased fetal movement. Pt verbalized understanding and denied further questions.

## 2022-04-12 ENCOUNTER — Encounter (HOSPITAL_COMMUNITY): Payer: Self-pay | Admitting: Obstetrics & Gynecology

## 2022-04-12 ENCOUNTER — Inpatient Hospital Stay (HOSPITAL_COMMUNITY)
Admission: AD | Admit: 2022-04-12 | Discharge: 2022-04-12 | Disposition: A | Discharge status: Home / Self Care | Payer: Commercial Managed Care - PPO | Attending: Obstetrics & Gynecology | Admitting: Obstetrics & Gynecology

## 2022-04-12 ENCOUNTER — Inpatient Hospital Stay (HOSPITAL_BASED_OUTPATIENT_CLINIC_OR_DEPARTMENT_OTHER): Payer: Commercial Managed Care - PPO

## 2022-04-12 ENCOUNTER — Inpatient Hospital Stay (HOSPITAL_COMMUNITY): Payer: Commercial Managed Care - PPO

## 2022-04-12 DIAGNOSIS — Z794 Long term (current) use of insulin: Secondary | ICD-10-CM | POA: Diagnosis not present

## 2022-04-12 DIAGNOSIS — Z3A33 33 weeks gestation of pregnancy: Secondary | ICD-10-CM | POA: Diagnosis not present

## 2022-04-12 DIAGNOSIS — O99213 Obesity complicating pregnancy, third trimester: Secondary | ICD-10-CM

## 2022-04-12 DIAGNOSIS — R109 Unspecified abdominal pain: Secondary | ICD-10-CM | POA: Diagnosis not present

## 2022-04-12 DIAGNOSIS — R112 Nausea with vomiting, unspecified: Secondary | ICD-10-CM

## 2022-04-12 DIAGNOSIS — E669 Obesity, unspecified: Secondary | ICD-10-CM

## 2022-04-12 DIAGNOSIS — O24013 Pre-existing diabetes mellitus, type 1, in pregnancy, third trimester: Secondary | ICD-10-CM | POA: Insufficient documentation

## 2022-04-12 DIAGNOSIS — E109 Type 1 diabetes mellitus without complications: Secondary | ICD-10-CM

## 2022-04-12 DIAGNOSIS — O26893 Other specified pregnancy related conditions, third trimester: Secondary | ICD-10-CM | POA: Diagnosis present

## 2022-04-12 DIAGNOSIS — F1290 Cannabis use, unspecified, uncomplicated: Secondary | ICD-10-CM | POA: Diagnosis not present

## 2022-04-12 DIAGNOSIS — R1032 Left lower quadrant pain: Secondary | ICD-10-CM | POA: Diagnosis present

## 2022-04-12 DIAGNOSIS — O212 Late vomiting of pregnancy: Secondary | ICD-10-CM | POA: Insufficient documentation

## 2022-04-12 DIAGNOSIS — O0993 Supervision of high risk pregnancy, unspecified, third trimester: Secondary | ICD-10-CM | POA: Insufficient documentation

## 2022-04-12 DIAGNOSIS — Z7984 Long term (current) use of oral hypoglycemic drugs: Secondary | ICD-10-CM | POA: Diagnosis not present

## 2022-04-12 DIAGNOSIS — O99323 Drug use complicating pregnancy, third trimester: Secondary | ICD-10-CM | POA: Insufficient documentation

## 2022-04-12 DIAGNOSIS — O4703 False labor before 37 completed weeks of gestation, third trimester: Secondary | ICD-10-CM

## 2022-04-12 DIAGNOSIS — Z3689 Encounter for other specified antenatal screening: Secondary | ICD-10-CM

## 2022-04-12 LAB — WET PREP, GENITAL
Clue Cells Wet Prep HPF POC: NONE SEEN
Sperm: NONE SEEN
Trich, Wet Prep: NONE SEEN
WBC, Wet Prep HPF POC: <10 (ref <10)
Yeast Wet Prep HPF POC: NONE SEEN

## 2022-04-12 LAB — CBC WITH DIFFERENTIAL/PLATELET
Abs Immature Granulocytes: 0.05 10*3/uL (ref 0.00–0.07)
Basophils Absolute: 0 10*3/uL (ref 0.0–0.1)
Basophils Relative: 0 %
Eosinophils Absolute: 0 10*3/uL (ref 0.0–0.5)
Eosinophils Relative: 0 %
HCT: 37.5 % (ref 36.0–46.0)
Hemoglobin: 12.3 g/dL (ref 12.0–15.0)
Immature Granulocytes: 0 %
Lymphocytes Relative: 15 %
Lymphs Abs: 2 10*3/uL (ref 0.7–4.0)
MCH: 26.7 pg (ref 26.0–34.0)
MCHC: 32.8 g/dL (ref 30.0–36.0)
MCV: 81.5 fL (ref 80.0–100.0)
Monocytes Absolute: 0.7 10*3/uL (ref 0.1–1.0)
Monocytes Relative: 5 %
Neutro Abs: 10.9 10*3/uL — ABNORMAL HIGH (ref 1.7–7.7)
Neutrophils Relative %: 80 %
Platelets: 337 10*3/uL (ref 150–400)
RBC: 4.6 MIL/uL (ref 3.87–5.11)
RDW: 13.5 % (ref 11.5–15.5)
WBC: 13.6 10*3/uL — ABNORMAL HIGH (ref 4.0–10.5)
nRBC: 0 % (ref 0.0–0.2)

## 2022-04-12 LAB — URINALYSIS, ROUTINE W REFLEX MICROSCOPIC
Bacteria, UA: NONE SEEN
Glucose, UA: >=500 mg/dL — AB
Ketones, ur: 80 mg/dL — AB
Leukocytes,Ua: NEGATIVE
Nitrite: NEGATIVE
Protein, ur: 30 mg/dL — AB
Specific Gravity, Urine: 1.028 (ref 1.005–1.030)
pH: 5 (ref 5.0–8.0)

## 2022-04-12 LAB — COMPREHENSIVE METABOLIC PANEL
ALT: 10 U/L (ref 0–44)
AST: 13 U/L — ABNORMAL LOW (ref 15–41)
Albumin: 2.4 g/dL — ABNORMAL LOW (ref 3.5–5.0)
Alkaline Phosphatase: 107 U/L (ref 38–126)
Anion gap: 11 (ref 5–15)
BUN: 9 mg/dL (ref 6–20)
CO2: 20 mmol/L — ABNORMAL LOW (ref 22–32)
Calcium: 8.6 mg/dL — ABNORMAL LOW (ref 8.9–10.3)
Chloride: 101 mmol/L (ref 98–111)
Creatinine, Ser: 0.68 mg/dL (ref 0.44–1.00)
GFR, Estimated: >60 mL/min (ref >60)
Glucose, Bld: 183 mg/dL — ABNORMAL HIGH (ref 70–99)
Potassium: 3.5 mmol/L (ref 3.5–5.1)
Sodium: 132 mmol/L — ABNORMAL LOW (ref 135–145)
Total Bilirubin: 0.3 mg/dL (ref 0.3–1.2)
Total Protein: 6.4 g/dL — ABNORMAL LOW (ref 6.5–8.1)

## 2022-04-12 MED ORDER — FENTANYL CITRATE (PF) 100 MCG/2ML IJ SOLN
100.0000 ug | Freq: Once | INTRAMUSCULAR | Status: AC
  Administered 2022-04-12: 100 ug via INTRAVENOUS

## 2022-04-12 MED ORDER — FENTANYL CITRATE (PF) 100 MCG/2ML IJ SOLN
INTRAMUSCULAR | Status: AC
  Filled 2022-04-12: qty 2

## 2022-04-12 MED ORDER — LACTATED RINGERS BOLUS PEDS
1000.0000 mL | Freq: Once | INTRAVENOUS | Status: AC
  Administered 2022-04-12: 1000 mL via INTRAVENOUS

## 2022-04-12 MED ORDER — LACTATED RINGERS IV BOLUS
1000.0000 mL | Freq: Once | INTRAVENOUS | Status: AC
  Administered 2022-04-12: 1000 mL via INTRAVENOUS

## 2022-04-12 MED ORDER — SIMETHICONE 80 MG PO CHEW
80.0000 mg | CHEWABLE_TABLET | Freq: Once | ORAL | Status: AC
  Administered 2022-04-12: 80 mg via ORAL
  Filled 2022-04-12: qty 1

## 2022-04-12 MED ORDER — HALOPERIDOL LACTATE 5 MG/ML IJ SOLN
2.0000 mg | Freq: Four times a day (QID) | INTRAMUSCULAR | Status: DC | PRN
  Administered 2022-04-12: 2 mg via INTRAVENOUS
  Filled 2022-04-12: qty 1

## 2022-04-12 MED ORDER — ONDANSETRON HCL 4 MG/2ML IJ SOLN
4.0000 mg | Freq: Once | INTRAMUSCULAR | Status: AC
  Administered 2022-04-12: 4 mg via INTRAVENOUS
  Filled 2022-04-12: qty 2

## 2022-04-12 MED ORDER — HYDROMORPHONE HCL 1 MG/ML IJ SOLN
INTRAMUSCULAR | Status: AC
  Administered 2022-04-12: 1 mg via INTRAVENOUS
  Filled 2022-04-12: qty 1

## 2022-04-12 MED ORDER — HYDROMORPHONE HCL 1 MG/ML IJ SOLN: 1.0000 mg | Freq: Once | INTRAMUSCULAR | Status: AC

## 2022-04-12 NOTE — MAU Provider Note (Signed)
Chief Complaint:  Abdominal Pain and Rupture of Membranes   HPI: Lydia Estrada is a 22 y.o. G2P0010 at 68w2dwho presents to maternity admissions reporting sudden onset severe, intermittent lower left quadrant pain that began within an hour of IC. Happened about q459m, told RN on arrival she also had rectal pressure. Also complaining of nausea - has not been able to keep food down for the past two days with 4 episodes of vomiting since the pain started. Reports small amount of LOF/discharge on the way to MAU. Overall presents very upset/anxious and in pain. Denies vaginal bleeding, decreased fetal movement, fever, falls, or other recent illness.   [facility]  Past Medical History:  Diagnosis Date   Herpes    type 1 and 2   Type 1 diabetes mellitus ([place]   Dx 03/2017, A1c 11%, presented in DKA. GAD antibodies markedly positive at 1493 (<5)   OB History  Gravida Para Term Preterm AB Living  2 0 0 0 1 0  SAB IAB Ectopic Multiple Live Births  1 0 0 0 0    # Outcome Date GA Lbr Len/2nd Weight Sex Delivery Anes PTL Lv  2 Current           1 SAB 2021           Past Surgical History:  Procedure Laterality Date   WISDOM TOOTH EXTRACTION     Family History  Problem Relation Age of Onset   Schizophrenia Sister    Bipolar disorder Brother    Diabetes Maternal Grandfather    Diabetes Maternal Grandmother    Social History   Tobacco Use   Smoking status: Former    Types: Cigarettes    Passive exposure: Current   Smokeless tobacco: Never  Vaping Use   Vaping Use: Every day  Substance Use Topics   Alcohol use: No   Drug use: No   Allergies  Allergen Reactions   Bee Venom Anaphylaxis   Pomegranate ([place] Granatum) Anaphylaxis   Grapefruit Concentrate    Ibuprofen     Throat swelling   Naproxen    Other     Seaweed, bee sting (anaphalytic)    Medications Prior to Admission  Medication Sig Dispense Refill Last Dose    aspirin EC (ASPIRIN 81) 81 MG tablet Take 1 tablet (81 mg total) by mouth daily. 30 tablet 11 04/11/2022   buPROPion (WELLBUTRIN XL) 150 MG 24 hr tablet Take 1 tablet (150 mg total) by mouth daily. 30 tablet 2 04/11/2022   buPROPion (WELLBUTRIN XL) 300 MG 24 hr tablet Take 1 tablet (300 mg total) by mouth daily. Take along with 15050mablet for a total of 450 mg daily. 30 tablet 2 04/11/2022   insulin degludec (TRESIBA FLEXTOUCH) 200 UNIT/ML FlexTouch Pen Inject up to 120 Units into the skin daily as directed per insulin protocol. 18 mL 5 04/12/2022 at 0100   insulin lispro (HUMALOG KWIKPEN) 200 UNIT/ML KwikPen Inject 150 Units into the skin daily. Carb counting sliding scale 69 mL 3 04/11/2022   metFORMIN (GLUCOPHAGE-XR) 500 MG 24 hr tablet TAKE 3 TABLETS BY MOUTH AT BREAKFAST 270 tablet 1 04/11/2022   prazosin (MINIPRESS) 2 MG capsule Take 1 capsule (2 mg total) by mouth at bedtime. 30 capsule 5 04/11/2022   Prenatal Vit-Fe Fumarate-FA (PRENATAL PO) Take 1 tablet by mouth daily.   04/11/2022   promethazine (PHENERGAN) 25 MG tablet Take 1 tablet (25 mg total) by  mouth every 6 (six) hours as needed for nausea or vomiting. 30 tablet 1 04/11/2022   sertraline (ZOLOFT) 100 MG tablet Take 2 tablets (200 mg total) by mouth daily. 60 tablet 5 04/11/2022   EPINEPHrine 0.3 mg/0.3 mL IJ SOAJ injection See admin instructions.      Glucagon (BAQSIMI TWO PACK) 3 MG/DOSE POWD Place 1 spray into one nostril once for 1 dose.  May repeat 1 spray in 15 minutes if inadequate response. 2 each 2    Glucagon (GVOKE HYPOPEN 1-PACK) 1 MG/0.2ML SOAJ inject 1 mg by subcutaneous route once in the abdomen, thigh, or upper arm may repeat in 15 minutes if inadequate response 0.2 mL 2    Glucagon (GVOKE HYPOPEN 2-PACK) 1 MG/0.2ML SOAJ Use as directed for hypoglycemia 0.4 mL 1    glucose blood (FREESTYLE LITE) test strip See admin instructions. (Patient not taking: Reported on 03/14/2022)      hydrOXYzine (ATARAX) 25 MG tablet 1 tablet as needed       insulin lispro (HUMALOG KWIKPEN) 200 UNIT/ML KwikPen Inject 30-40 Units into the skin 3 (three) times daily with meals plus correctional insulin. Max 150 units/day 18 mL 3    Insulin Pen Needle (UNIFINE PENTIPS) 32G X 4 MM MISC USE TO INJECT INSULIN 6 TIMES A DAY 600 each 1    I have reviewed patient's Past Medical Hx, Surgical Hx, Family Hx, Social Hx, medications and allergies.   ROS:  Pertinent items noted in HPI and remainder of comprehensive ROS otherwise negative.   Physical Exam  Patient Vitals for the past 24 hrs:  BP Temp Temp src Pulse Resp Height Weight  04/12/22 0540 (!) 110/45 -- -- 90 -- -- --  04/12/22 0508 (!) 121/100 -- -- 86 -- -- --  04/12/22 0414 124/72 97.9 F (36.6 C) Oral 100 18 5' 8"$  (1.727 m) (!) 330 lb 8 oz (149.9 kg)   Constitutional: Well-developed, well-nourished female in acute distress.  Cardiovascular: normal rhythm/tachycardic but warm and well-perfused Respiratory: normal effort, no problems with respiration noted GI: Abd soft, non-tender (pt actively pushing on lower left side), gravid appropriate for gestational age MS: Extremities nontender, no edema, normal ROM Neurologic: Alert and oriented x 4.  GU: no CVA tenderness Pelvic: NEFG, physiologic discharge, no blood/fluid noted   Dilation: Closed Exam by:: Gaylan Gerold CNM  Fetal Tracing: reactive Baseline: 140 (short monitoring due to pt discomfort) Variability: moderate Accelerations: 15x15 Decelerations: none (one that appears is when pt sitting straight up) Toco: relaxed   Labs: Results for orders placed or performed during the hospital encounter of 04/12/22 (from the past 24 hour(s))  CBC with Differential/Platelet     Status: Abnormal   Collection Time: 04/12/22  4:55 AM  Result Value Ref Range   WBC 13.6 (H) 4.0 - 10.5 K/uL   RBC 4.60 3.87 - 5.11 MIL/uL   Hemoglobin 12.3 12.0 - 15.0 g/dL   HCT 37.5 36.0 - 46.0 %   MCV 81.5 80.0 - 100.0 fL   MCH 26.7 26.0 - 34.0 pg   MCHC  32.8 30.0 - 36.0 g/dL   RDW 13.5 11.5 - 15.5 %   Platelets 337 150 - 400 K/uL   nRBC 0.0 0.0 - 0.2 %   Neutrophils Relative % 80 %   Neutro Abs 10.9 (H) 1.7 - 7.7 K/uL   Lymphocytes Relative 15 %   Lymphs Abs 2.0 0.7 - 4.0 K/uL   Monocytes Relative 5 %   Monocytes Absolute 0.7 0.1 -  1.0 K/uL   Eosinophils Relative 0 %   Eosinophils Absolute 0.0 0.0 - 0.5 K/uL   Basophils Relative 0 %   Basophils Absolute 0.0 0.0 - 0.1 K/uL   Immature Granulocytes 0 %   Abs Immature Granulocytes 0.05 0.00 - 0.07 K/uL  Comprehensive metabolic panel     Status: Abnormal   Collection Time: 04/12/22  4:55 AM  Result Value Ref Range   Sodium 132 (L) 135 - 145 mmol/L   Potassium 3.5 3.5 - 5.1 mmol/L   Chloride 101 98 - 111 mmol/L   CO2 20 (L) 22 - 32 mmol/L   Glucose, Bld 183 (H) 70 - 99 mg/dL   BUN 9 6 - 20 mg/dL   Creatinine, Ser 0.68 0.44 - 1.00 mg/dL   Calcium 8.6 (L) 8.9 - 10.3 mg/dL   Total Protein 6.4 (L) 6.5 - 8.1 g/dL   Albumin 2.4 (L) 3.5 - 5.0 g/dL   AST 13 (L) 15 - 41 U/L   ALT 10 0 - 44 U/L   Alkaline Phosphatase 107 38 - 126 U/L   Total Bilirubin 0.3 0.3 - 1.2 mg/dL   GFR, Estimated >60 >60 mL/min   Anion gap 11 5 - 15  Urinalysis, Routine w reflex microscopic -Urine, Clean Catch     Status: Abnormal   Collection Time: 04/12/22  6:00 AM  Result Value Ref Range   Color, Urine AMBER (A) YELLOW   APPearance HAZY (A) CLEAR   Specific Gravity, Urine 1.028 1.005 - 1.030   pH 5.0 5.0 - 8.0   Glucose, UA >=500 (A) NEGATIVE mg/dL   Hgb urine dipstick MODERATE (A) NEGATIVE   Bilirubin Urine SMALL (A) NEGATIVE   Ketones, ur 80 (A) NEGATIVE mg/dL   Protein, ur 30 (A) NEGATIVE mg/dL   Nitrite NEGATIVE NEGATIVE   Leukocytes,Ua NEGATIVE NEGATIVE   RBC / HPF 21-50 0 - 5 RBC/hpf   WBC, UA 0-5 0 - 5 WBC/hpf   Bacteria, UA NONE SEEN NONE SEEN   Squamous Epithelial / HPF 0-5 0 - 5 /HPF   Mucus PRESENT    Ca Oxalate Crys, UA PRESENT   Wet prep, genital     Status: None   Collection Time:  04/12/22  6:18 AM  Result Value Ref Range   Yeast Wet Prep HPF POC NONE SEEN NONE SEEN   Trich, Wet Prep NONE SEEN NONE SEEN   Clue Cells Wet Prep HPF POC NONE SEEN NONE SEEN   WBC, Wet Prep HPF POC <10 <10   Sperm NONE SEEN    Imaging:  Limited OB U/S normal  US RENAL  Result Date: 04/12/2022 CLINICAL DATA:  Abdominal pain, left flank pain EXAM: RENAL / URINARY TRACT ULTRASOUND COMPLETE COMPARISON:  CT abdomen/pelvis 01/09/2019 FINDINGS: Right Kidney: Renal measurements: 11.2 x 5.4 x 5.2 cm = volume: 164.1 mL. Echogenicity within normal limits. No mass or hydronephrosis visualized. Left Kidney: Renal measurements: 14.7 x 6.2 x 6.1 cm = volume: 292 mL. Echogenicity within normal limits. No mass or hydronephrosis visualized. Bladder: Decompressed bladder. Other: None. IMPRESSION: 1. Normal renal ultrasound. Electronically Signed   By: Kathreen Devoid M.D.   On: 04/12/2022 07:16    MAU Course: Orders Placed This Encounter  Procedures   Wet prep, genital   [place] MFM OB LIMITED   US RENAL   CBC with Differential/Platelet   Comprehensive metabolic panel   Urinalysis, Routine w reflex microscopic -Urine, Clean Catch   Discharge patient   Meds ordered this encounter  Medications   lactated ringers bolus 1,000 mL   ondansetron (ZOFRAN) injection 4 mg   simethicone (MYLICON) chewable tablet 80 mg   fentaNYL (SUBLIMAZE) injection 100 mcg   fentaNYL (SUBLIMAZE) 100 MCG/2ML injection    Faucett, Kristen: cabinet override   HYDROmorphone (DILAUDID) injection 1 mg   lactated ringers bolus PEDS   HYDROmorphone (DILAUDID) 1 MG/ML injection    Faucett, Kristen: cabinet override   haloperidol lactate (HALDOL) injection 2 mg   Patient very uncomfortable, sweaty and crying upon arrival to MAU and complaining of lower abdominal cramping and stated "now I feel like I need to sh--". RN called CNM to room for urgent cervical exam, cervix closed/thick/posterior.  LR bolus, zofran and fentanyl ordered for  pain relief, labs drawn, swabs collected and pt sent to ultrasound. OB ultrasound normal, fentanyl quickly wore off and pt complaining of left lower quadrant/side pain again. UA had resulted which looked suspect of nephrolithiasis so renal ultrasound ordered and 102m dilaudid given+additional LR bolus. Sonographer came to report renal U/S normal but pt crying, wretching and clutching her side through most of exam.   Questioned if pt had any recent MJ use, they divulged very recent use, 276mhaldol ordered which completely relieved symptoms. Discussed cannabinoid hyperemesis syndrome and advised abstinence from future MJ use while pregnant. Pt and FOB agreed.   Stable for discharge home.  MDM: High  Assessment: 1. Supervision of high risk pregnancy in third trimester   2. Cannabinoid hyperemesis syndrome   3. NST (non-stress test) reactive   4. [redacted] weeks gestation of pregnancy    Plan: Discharge home in stable condition with return precautions     [facility].   Specialty: Obstetrics and Gynecology Why: as scheduled for ongoing prenatal care Contact information: [phone]              Allergies as of 04/12/2022       Reactions   Bee Venom Anaphylaxis   Pomegranate (punica Granatum) Anaphylaxis   Grapefruit Concentrate    Ibuprofen    Throat swelling   Naproxen    Other    Seaweed, bee sting (anaphalytic)         Medication List     TAKE these medications    aspirin EC 81 MG tablet Commonly known as: Aspirin 81 Take 1 tablet (81 mg total) by mouth daily.   Baqsimi Two Pack 3 MG/DOSE Powd Generic drug: Glucagon Place 1 spray into one nostril once for 1 dose.  May repeat 1 spray in 15 minutes if inadequate response.   Gvoke HypoPen 1-Pack 1 MG/0.2ML Soaj Generic drug: Glucagon inject 1 mg by subcutaneous route once  in the abdomen, thigh, or upper arm may repeat in 15 minutes if inadequate response   Gvoke HypoPen 2-Pack 1 MG/0.2ML Soaj Generic drug: Glucagon Use as directed for hypoglycemia   buPROPion 150 MG 24 hr tablet Commonly known as: WELLBUTRIN XL Take 1 tablet (150 mg total) by mouth daily.   buPROPion 300 MG 24 hr tablet Commonly known as: WELLBUTRIN XL Take 1 tablet (300 mg total) by mouth daily. Take along with 15061mablet for a total of 450 mg daily.   EPINEPHrine 0.3 mg/0.3 mL Soaj injection Commonly known as: EPI-PEN See admin instructions.   FREESTYLE LITE test strip Generic drug: glucose blood See admin instructions.   HumaLOG  KwikPen 200 UNIT/ML KwikPen Generic drug: insulin lispro Inject 150 Units into the skin daily. Carb counting sliding scale   HumaLOG KwikPen 200 UNIT/ML KwikPen Generic drug: insulin lispro Inject 30-40 Units into the skin 3 (three) times daily with meals plus correctional insulin. Max 150 units/day   hydrOXYzine 25 MG tablet Commonly known as: ATARAX 1 tablet as needed   metFORMIN 500 MG 24 hr tablet Commonly known as: GLUCOPHAGE-XR TAKE 3 TABLETS BY MOUTH AT BREAKFAST   prazosin 2 MG capsule Commonly known as: MINIPRESS Take 1 capsule (2 mg total) by mouth at bedtime.   PRENATAL PO Take 1 tablet by mouth daily.   promethazine 25 MG tablet Commonly known as: PHENERGAN Take 1 tablet (25 mg total) by mouth every 6 (six) hours as needed for nausea or vomiting.   sertraline 100 MG tablet Commonly known as: ZOLOFT Take 2 tablets (200 mg total) by mouth daily.   TechLite Pen Needles 32G X 4 MM Misc Generic drug: Insulin Pen Needle USE TO INJECT INSULIN 6 TIMES A DAY   Tresiba FlexTouch 200 UNIT/ML FlexTouch Pen Generic drug: insulin degludec Inject up to 120 Units into the skin daily as directed per insulin protocol.       Gaylan Gerold, CNM, MSN, [place] Certified Nurse Midwife, [facility]

## 2022-04-12 NOTE — MAU Note (Signed)
Pt says she has been having UC's  since 2300. Then at 0100- saw fluid from her vag - clear.  [facility]- [place]- Dr Kennon Rounds

## 2022-04-14 DIAGNOSIS — O24019 Pre-existing diabetes mellitus, type 1, in pregnancy, unspecified trimester: Secondary | ICD-10-CM | POA: Diagnosis not present

## 2022-04-14 LAB — GC/CHLAMYDIA PROBE AMP (~~LOC~~) NOT AT ARMC
Chlamydia: NEGATIVE
Comment: NEGATIVE
Comment: NORMAL
Neisseria Gonorrhea: NEGATIVE

## 2022-04-17 ENCOUNTER — Ambulatory Visit: Payer: Commercial Managed Care - PPO

## 2022-04-18 ENCOUNTER — Other Ambulatory Visit (HOSPITAL_COMMUNITY)
Admission: RE | Admit: 2022-04-18 | Discharge: 2022-04-18 | Disposition: A | Discharge status: Home / Self Care | Payer: Commercial Managed Care - PPO | Source: Ambulatory Visit | Attending: Family Medicine | Admitting: Family Medicine

## 2022-04-18 ENCOUNTER — Ambulatory Visit (INDEPENDENT_AMBULATORY_CARE_PROVIDER_SITE_OTHER): Payer: Commercial Managed Care - PPO | Admitting: Family Medicine

## 2022-04-18 ENCOUNTER — Other Ambulatory Visit: Payer: Self-pay

## 2022-04-18 ENCOUNTER — Other Ambulatory Visit (HOSPITAL_COMMUNITY): Payer: Self-pay

## 2022-04-18 VITALS — BP 113/79 | HR 100 | Wt 343.7 lb

## 2022-04-18 DIAGNOSIS — O0993 Supervision of high risk pregnancy, unspecified, third trimester: Secondary | ICD-10-CM | POA: Diagnosis not present

## 2022-04-18 DIAGNOSIS — O24013 Pre-existing diabetes mellitus, type 1, in pregnancy, third trimester: Secondary | ICD-10-CM

## 2022-04-18 DIAGNOSIS — R894 Abnormal immunological findings in specimens from other organs, systems and tissues: Secondary | ICD-10-CM

## 2022-04-18 DIAGNOSIS — Z3A34 34 weeks gestation of pregnancy: Secondary | ICD-10-CM

## 2022-04-18 DIAGNOSIS — O3663X0 Maternal care for excessive fetal growth, third trimester, not applicable or unspecified: Secondary | ICD-10-CM

## 2022-04-18 DIAGNOSIS — F332 Major depressive disorder, recurrent severe without psychotic features: Secondary | ICD-10-CM

## 2022-04-18 DIAGNOSIS — A630 Anogenital (venereal) warts: Secondary | ICD-10-CM

## 2022-04-18 MED ORDER — IMIQUIMOD 5 % EX CREA
TOPICAL_CREAM | CUTANEOUS | 0 refills | Status: AC
  Filled 2022-04-18: qty 12, 28d supply, fill #0

## 2022-04-18 MED ORDER — VALACYCLOVIR HCL 500 MG PO TABS
500.0000 mg | ORAL_TABLET | Freq: Two times a day (BID) | ORAL | 1 refills | Status: AC
  Filled 2022-04-18: qty 60, 30d supply, fill #0
  Filled 2022-05-16: qty 60, 30d supply, fill #1

## 2022-04-18 MED ORDER — TRESIBA FLEXTOUCH 200 UNIT/ML ~~LOC~~ SOPN
60.0000 [IU] | PEN_INJECTOR | Freq: Two times a day (BID) | SUBCUTANEOUS | 5 refills | Status: DC
  Filled 2022-04-18 – 2022-05-01 (×2): qty 18, 30d supply, fill #0

## 2022-04-18 NOTE — Progress Notes (Signed)
PRENATAL VISIT NOTE  Subjective:  Lydia Estrada is a 22 y.o. G2P0010 at 75w1dbeing seen today for ongoing prenatal care.  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; Acanthosis nigricans, acquired; Dyspepsia; Type 1 diabetes mellitus without complication ([place]; Severe recurrent major depression without psychotic features (HM[place]; Attention deficit hyperactivity disorder (ADHD), predominantly inattentive type; Morbid obesity ([place]West [place]; Positive test for herpes simplex virus (HSV) antibody; Supervision of high-risk pregnancy; Type 1 diabetes mellitus during pregnancy, third trimester; Late prenatal care affecting pregnancy in third trimester; HSV-2 seropositive; and LGA (large for gestational age) fetus affecting management of mother on their problem list.  Patient reports  bumps on vagina .  Contractions: Irritability. Vag. Bleeding: None.  Movement: Present. Denies leaking of fluid.   The following portions of the patient's history were reviewed and updated as appropriate: allergies, current medications, past family history, past medical history, past social history, past surgical history and problem list.   Objective:   Vitals:   04/18/22 0901  BP: 113/79  Pulse: 100  Weight: (!) 343 lb 11.2 oz (155.9 kg)    Fetal Status: Fetal Heart Rate (bpm): 143   Movement: Present     General:  Alert, oriented and cooperative. Patient is in no acute distress.  Skin: Skin is warm and dry. No rash noted.   Cardiovascular: Normal heart rate noted  Respiratory: Normal respiratory effort, no problems with respiration noted  Abdomen: Soft, gravid, appropriate for gestational age.  Pain/Pressure: Present     Pelvic: Cervical exam performed in the presence of a chaperone      small genital wart @ introitus and right labia majora  Extremities: Normal range of motion.  Edema: None  Mental Status: Normal mood and affect.  Normal behavior. Normal judgment and thought content.   Assessment and Plan:  Pregnancy: G2P0010 at 328w1d. Type 1 diabetes mellitus during pregnancy, third trimester Followed by Endocrine. Reports some sugars are higher at times She is on metformin, tresiba, now in divided doses and meal coverage with adjustments based on CBG and what she is going to be eating. Has LGA-- IOL scheduled at 37 weeks - insulin degludec (TRESIBA FLEXTOUCH) 200 UNIT/ML FlexTouch Pen; Inject 60 Units into the skin in the morning and at bedtime.  Dispense: 18 mL; Refill: 5  2. Supervision of high risk pregnancy in third trimester Cultures today, due to IOL in 3 weeks - Strep Gp B NAA - Cervicovaginal ancillary only( [place])  3. Severe recurrent major depression without psychotic features (HCBostonOn Wellbutrin and Zoloft  4. Positive test for herpes simplex virus (HSV) antibody Being Ppx - valACYclovir (VALTREX) 500 MG tablet; Take 1 tablet (500 mg total) by mouth 2 (two) times daily.  Dispense: 60 tablet; Refill: 1  5. Excessive fetal growth affecting management of pregnancy in third trimester, single or unspecified fetus With T1DM, will move to IOL @ 37 weeks  6. Genital warts - imiquimod (ALDARA) 5 % cream; Apply topically 3 (three) times a week.  Dispense: 12 each; Refill: 0   Preterm labor symptoms and general obstetric precautions including but not limited to vaginal bleeding, contractions, leaking of fluid and fetal movement were reviewed in detail with the patient. Please refer to After Visit Summary for other counseling recommendations.   Return in 2 weeks (on 05/02/2022).  Future Appointments  Date Time Provider [place]2/23/2024 12:30 PM [facility]2/23/2024  12:45 PM W[facility]-M[facility] US5 [facility]US [facility]  05/01/2022  8:55 AM Donnamae Jude, MD [facility]  05/01/2022 11:15 AM W[facility] NURSE [facility]  05/01/2022 11:30 AM W[facility] US2 [facility]  05/08/2022  8:55 AM Donnamae Jude, MD [facility]  05/08/2022  1:45 PM W[facility]-MFC NURSE [facility]  05/08/2022  2:00 PM W[facility]-MFC US1 [facility]US [facility]  05/15/2022  8:55 AM Aletha Halim, MD [facility]  05/22/2022  8:55 AM Griffin Basil, MD [facility]  05/23/2022  9:00 AM Norman Clay, MD [facility] None  05/28/2022  8:55 AM Aletha Halim, MD [facility]    Donnamae Jude, MD

## 2022-04-20 LAB — STREP GP B NAA: Strep Gp B NAA: POSITIVE — AB

## 2022-04-21 ENCOUNTER — Encounter: Payer: Self-pay | Admitting: Family Medicine

## 2022-04-21 DIAGNOSIS — O99820 Streptococcus B carrier state complicating pregnancy: Secondary | ICD-10-CM | POA: Insufficient documentation

## 2022-04-21 LAB — CERVICOVAGINAL ANCILLARY ONLY
Chlamydia: NEGATIVE
Comment: NEGATIVE
Comment: NORMAL
Neisseria Gonorrhea: NEGATIVE

## 2022-04-22 ENCOUNTER — Other Ambulatory Visit (HOSPITAL_COMMUNITY): Payer: Self-pay

## 2022-04-25 ENCOUNTER — Ambulatory Visit: Payer: Commercial Managed Care - PPO | Admitting: *Deleted

## 2022-04-25 ENCOUNTER — Ambulatory Visit: Payer: Commercial Managed Care - PPO | Attending: Obstetrics

## 2022-04-25 VITALS — BP 113/67 | HR 95

## 2022-04-25 DIAGNOSIS — O0993 Supervision of high risk pregnancy, unspecified, third trimester: Secondary | ICD-10-CM | POA: Diagnosis not present

## 2022-04-25 DIAGNOSIS — Z3A35 35 weeks gestation of pregnancy: Secondary | ICD-10-CM | POA: Diagnosis not present

## 2022-04-25 DIAGNOSIS — O24013 Pre-existing diabetes mellitus, type 1, in pregnancy, third trimester: Secondary | ICD-10-CM

## 2022-04-25 DIAGNOSIS — O99820 Streptococcus B carrier state complicating pregnancy: Secondary | ICD-10-CM | POA: Insufficient documentation

## 2022-04-25 DIAGNOSIS — O99213 Obesity complicating pregnancy, third trimester: Secondary | ICD-10-CM | POA: Diagnosis not present

## 2022-04-25 DIAGNOSIS — E109 Type 1 diabetes mellitus without complications: Secondary | ICD-10-CM | POA: Diagnosis not present

## 2022-04-25 DIAGNOSIS — E669 Obesity, unspecified: Secondary | ICD-10-CM | POA: Diagnosis not present

## 2022-05-01 ENCOUNTER — Other Ambulatory Visit: Payer: Commercial Managed Care - PPO

## 2022-05-01 ENCOUNTER — Other Ambulatory Visit (INDEPENDENT_AMBULATORY_CARE_PROVIDER_SITE_OTHER): Payer: Self-pay | Admitting: Family

## 2022-05-01 ENCOUNTER — Telehealth (HOSPITAL_COMMUNITY): Payer: Self-pay | Admitting: *Deleted

## 2022-05-01 ENCOUNTER — Telehealth (INDEPENDENT_AMBULATORY_CARE_PROVIDER_SITE_OTHER): Payer: Commercial Managed Care - PPO | Admitting: Family Medicine

## 2022-05-01 ENCOUNTER — Other Ambulatory Visit (HOSPITAL_COMMUNITY): Payer: Self-pay

## 2022-05-01 ENCOUNTER — Other Ambulatory Visit: Payer: Self-pay | Admitting: Psychiatry

## 2022-05-01 ENCOUNTER — Encounter (HOSPITAL_COMMUNITY): Payer: Self-pay | Admitting: *Deleted

## 2022-05-01 ENCOUNTER — Ambulatory Visit: Payer: Commercial Managed Care - PPO

## 2022-05-01 ENCOUNTER — Telehealth: Payer: Self-pay | Admitting: Registered"

## 2022-05-01 ENCOUNTER — Encounter (HOSPITAL_COMMUNITY): Payer: Self-pay

## 2022-05-01 ENCOUNTER — Telehealth: Payer: Self-pay | Admitting: *Deleted

## 2022-05-01 ENCOUNTER — Other Ambulatory Visit: Payer: Self-pay

## 2022-05-01 DIAGNOSIS — O0993 Supervision of high risk pregnancy, unspecified, third trimester: Secondary | ICD-10-CM

## 2022-05-01 DIAGNOSIS — O3663X0 Maternal care for excessive fetal growth, third trimester, not applicable or unspecified: Secondary | ICD-10-CM | POA: Diagnosis not present

## 2022-05-01 DIAGNOSIS — O99820 Streptococcus B carrier state complicating pregnancy: Secondary | ICD-10-CM

## 2022-05-01 DIAGNOSIS — Z3A36 36 weeks gestation of pregnancy: Secondary | ICD-10-CM | POA: Diagnosis not present

## 2022-05-01 DIAGNOSIS — E109 Type 1 diabetes mellitus without complications: Secondary | ICD-10-CM

## 2022-05-01 DIAGNOSIS — O24013 Pre-existing diabetes mellitus, type 1, in pregnancy, third trimester: Secondary | ICD-10-CM

## 2022-05-01 MED ORDER — BUPROPION HCL ER (XL) 150 MG PO TB24
150.0000 mg | ORAL_TABLET | Freq: Every day | ORAL | 5 refills | Status: DC
  Filled 2022-05-01: qty 30, 30d supply, fill #0
  Filled 2022-06-02: qty 30, 30d supply, fill #1
  Filled 2022-07-05: qty 30, 30d supply, fill #2
  Filled 2022-08-14 (×2): qty 30, 30d supply, fill #3
  Filled 2022-09-08: qty 30, 30d supply, fill #4
  Filled 2022-10-24: qty 30, 30d supply, fill #5

## 2022-05-01 NOTE — Telephone Encounter (Signed)
Preadmission screen  

## 2022-05-01 NOTE — Telephone Encounter (Addendum)
Pt left VM message stating that she was able to access her Dexcom app. She provided a 52-monthcode for reviewing her blood sugars.  # [id number]. I discussed this with ALevie Heritagewho reached out to pt in a different encounter and reviewed blood sugar readings. ALevada Dyalso sent message to Dr. PKennon Rounds

## 2022-05-01 NOTE — Progress Notes (Signed)
OBSTETRICS PRENATAL VIRTUAL VISIT ENCOUNTER NOTE  Provider location: [facility] [place] [facility]   Patient location: Home  I connected with Lydia Estrada on 05/01/22 at  8:55 AM EST by MyChart Video Encounter and verified that I am speaking with the correct person using two identifiers. I discussed the limitations, risks, security and privacy concerns of performing an evaluation and management service virtually and the availability of in person appointments. I also discussed with the patient that there may be a patient responsible charge related to this service. The patient expressed understanding and agreed to proceed. Subjective:  Lydia Estrada is a 22 y.o. G2P0010 at 109w0dbeing seen today for ongoing prenatal care.  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; Acanthosis nigricans, acquired; Dyspepsia; Severe recurrent major depression without psychotic features ([place]; Attention deficit hyperactivity disorder (ADHD), predominantly inattentive type; Morbid obesity ([place]; Positive test for herpes simplex virus (HSV) antibody; Supervision of high-risk pregnancy; Type 1 diabetes mellitus during pregnancy, third trimester; Late prenatal care affecting pregnancy in third trimester; HSV-2 seropositive; LGA (large for gestational age) fetus affecting management of mother; and Group B Streptococcus carrier, +RV culture, currently pregnant on their problem list.  Patient reports  URI congestion and numbness above her belly button .  Contractions: Not present. Vag. Bleeding: None.  Movement: Present. Denies any leaking of fluid.   The following portions of the patient's history were reviewed and updated as appropriate: allergies, current medications, past family history, past medical history, past social history, past surgical history and problem list.   Objective:  There were no vitals filed  for this visit.  Fetal Status:     Movement: Present     General:  Alert, oriented and cooperative. Patient is in no acute distress.  Respiratory: Normal respiratory effort, no problems with respiration noted  Mental Status: Normal mood and affect. Normal behavior. Normal judgment and thought content.  Rest of physical exam deferred due to type of encounter  Imaging: [place]FM FETAL BPP WO NON STRESS  Result Date: 04/25/2022 ----------------------------------------------------------------------  OBSTETRICS REPORT                       (Signed Final 04/25/2022 01:49 pm) ---------------------------------------------------------------------- Patient Info  ID #:       [id number]                         D.O.B.:  104-08-2000(22 yrs)  Name:       Lydia Estrada                Visit Date: 04/25/2022 12:38 pm ---------------------------------------------------------------------- Performed By  Attending:        VJohnell ComingsMD         Ref. Address:     [address]  [id number]  Performed By:     Nathen May       [facility]                                                             [facility]                                                             [facility]  Referred By:      Donnamae Jude                    MD ---------------------------------------------------------------------- Orders  #  Description                           Code        Ordered By  1  [place] MFM FETAL BPP WO NON               76819.01    YU FANG     STRESS ----------------------------------------------------------------------  #  Order #                     Accession #                Episode #  1  [id number]                   [id number]                 [id number]  ---------------------------------------------------------------------- Indications  Pre-existing diabetes, type 1, in pregnancy,   O24.013  third trimester  Obesity complicating pregnancy, third          O99.213  trimester (BMI 49)  [redacted] weeks gestation of pregnancy                Z3A.35 ---------------------------------------------------------------------- Fetal Evaluation  Num Of Fetuses:         1  Fetal Heart Rate(bpm):  150  Cardiac Activity:       Observed  Presentation:           Breech  Placenta:               Anterior  P. Cord Insertion:      Previously visualized  Amniotic Fluid  AFI FV:      Within normal limits  AFI Sum(cm)     %Tile       Largest Pocket(cm)  15.85           58          7.13  RUQ(cm)       RLQ(cm)  LUQ(cm)        LLQ(cm)  3.81          7.13          1.58           3.33 ---------------------------------------------------------------------- Biophysical Evaluation  Amniotic F.V:   Pocket => 2 cm             F. Tone:        Observed  F. Movement:    Observed                   Score:          8/8  F. Breathing:   Observed ---------------------------------------------------------------------- OB History  Gravidity:    2         Term:   0        Prem:   0        SAB:   1  TOP:          0       Ectopic:  0        Living: 0 ---------------------------------------------------------------------- Gestational Age  LMP:           44w 0d        Date:  06/21/21                  EDD:   03/28/22  Clinical EDD:  35w 1d                                        EDD:   05/29/22  Best:          35w 1d     Det. By:  Clinical EDD             EDD:   05/29/22 ---------------------------------------------------------------------- Comments  This patient was seen for a BPP due to maternal obesity with  a BMI of 49 and pregestational diabetes treated with  metformin and insulin.  She denies any problems since her  last exam and reports feeling fetal movements throughout the  day.  A biophysical profile performed today  was 8/8.  The fetus was in the breech presentation today.  The AFI was 15.85 cm (within normal limits).  She will return in 1 week for another BPP and growth scan.  She already has an induction of labor scheduled on May 08, 2022. ----------------------------------------------------------------------                   Johnell Comings, MD Electronically Signed Final Report   04/25/2022 01:49 pm ----------------------------------------------------------------------  [place] MFM OB LIMITED  Result Date: 04/13/2022 ----------------------------------------------------------------------  OBSTETRICS REPORT                        (Signed Final 04/13/2022 06:33 am) ---------------------------------------------------------------------- Patient Info  ID #:       [id number]                          D.O.B.:  2001/01/25 (21 yrs)  Name:       Lydia Estrada                 Visit Date: 04/12/2022 05:30 am ---------------------------------------------------------------------- Performed By  Attending:        Sander Nephew      Ref. Address:  [place], [place]  Performed By:     Hubert Azure          [facility]                    [place]  Referred By:      Donnamae Jude                    MD ---------------------------------------------------------------------- Orders  #  Description                           Code        Ordered By  1  [place] MFM OB LIMITED                     [id number]    JAMILLA WALKER ----------------------------------------------------------------------  #  Order #                     Accession #                Episode #  1  [id number]                   [id number]                 [id number] ---------------------------------------------------------------------- Indications  Preterm  contractions                            O47.00  Pre-existing diabetes, type 1, in pregnancy,    O24.013  third trimester  Obesity complicating pregnancy, third           O99.213  trimester (BMI 49)  [redacted] weeks gestation of pregnancy                 Z3A.33 ---------------------------------------------------------------------- Fetal Evaluation  Num Of Fetuses:          1  Fetal Heart Rate(bpm):   149  Cardiac Activity:        Observed  Presentation:            Cephalic  Placenta:                Anterior  P. Cord Insertion:       Visualized, central  Amniotic  Fluid  AFI FV:      Within normal limits  AFI Sum(cm)     %Tile       Largest Pocket(cm)  16.4            59          7.5  RUQ(cm)       RLQ(cm)       LUQ(cm)        LLQ(cm)  4.3           3.4           7.5            1.2  Comment:    No placental abruption or previa identified. ---------------------------------------------------------------------- OB History  Gravidity:    2         Term:   0        Prem:   0        SAB:   1  TOP:          0       Ectopic:  0        Living: 0 ---------------------------------------------------------------------- Gestational Age  LMP:           42w 1d        Date:  06/21/21                  EDD:   03/28/22  Clinical EDD:  33w 2d                                        EDD:   05/29/22  Best:          33w 2d     Det. By:  Clinical EDD             EDD:   05/29/22 ---------------------------------------------------------------------- Impression  Limited exam due to T1DM and elevated BMI  Good fetal movement and amniotic fluid volume  No evidence of placenta abruption or previa. ---------------------------------------------------------------------- Recommendations  Clinical correlation recommended. ----------------------------------------------------------------------              Sander Nephew, MD Electronically Signed Final Report   04/13/2022 06:33 am ----------------------------------------------------------------------  US  RENAL  Result Date: 04/12/2022 CLINICAL DATA:  Abdominal pain, left flank pain EXAM: RENAL / URINARY TRACT ULTRASOUND COMPLETE COMPARISON:  CT abdomen/pelvis 01/09/2019 FINDINGS: Right Kidney: Renal measurements: 11.2 x 5.4 x 5.2 cm = volume: 164.1 mL. Echogenicity within normal limits. No mass or hydronephrosis visualized. Left Kidney: Renal measurements: 14.7 x 6.2 x 6.1 cm = volume: 292 mL. Echogenicity within normal limits. No mass or hydronephrosis visualized. Bladder: Decompressed bladder. Other: None. IMPRESSION: 1. Normal renal ultrasound. Electronically Signed   By: Kathreen Devoid M.D.   On: 04/12/2022 07:16   [place] MFM OB DETAIL +14 WK  Result Date: 04/10/2022 ----------------------------------------------------------------------  OBSTETRICS REPORT                       (Signed Final 04/10/2022 04:46 pm) ---------------------------------------------------------------------- Patient Info  ID #:       [id number]                          D.O.B.:  Feb 14, 2001 (21 yrs)  Name:       Gretel Acre  Visit Date: 04/10/2022 02:49 pm ---------------------------------------------------------------------- Performed By  Attending:        Johnell Comings MD         Ref. Address:     [address]                                                             [place], [place]  Performed By:     Milus Glazier,      [facility]                                     [facility] at                                                             [facility] for                                                             Women  Referred By:      [facility] ---------------------------------------------------------------------- Orders  #  Description                           Code        Ordered By  1  [place] MFM OB DETAIL +14 [place]               76811.01    Darron Doom  2  [place] MFM FETAL BPP WO  NON               [id number]    MATTHEW ECKSTAT     STRESS ----------------------------------------------------------------------  #  Order #                     Accession #                Episode #  1  [id number]                   [id number]                 [id number]  2  [id number]  [id number]                 [id number] ---------------------------------------------------------------------- Indications  Pre-existing diabetes, type 1, in pregnancy,   O24.013  third trimester  Obesity complicating pregnancy, third          O99.213  trimester (BMI 49)  [redacted] weeks gestation of pregnancy                Z3A.33 ---------------------------------------------------------------------- Fetal Evaluation  Num Of Fetuses:         1  Fetal Heart Rate(bpm):  150  Cardiac Activity:       Observed  Presentation:           Cephalic  Placenta:               Anterior  P. Cord Insertion:      Visualized  Amniotic Fluid  AFI FV:      Within normal limits  AFI Sum(cm)     %Tile       Largest Pocket(cm)  17.39           64          5.87  RUQ(cm)       RLQ(cm)       LUQ(cm)        LLQ(cm)  5.87          5.41          2.6            3.51 ---------------------------------------------------------------------- Biophysical Evaluation  Amniotic F.V:   Within normal limits       F. Tone:        Observed  F. Movement:    Observed                   Score:          8/8  F. Breathing:   Observed ---------------------------------------------------------------------- Biometry  BPD:      85.9  mm     G. Age:  34w 4d         87  %    CI:        76.05   %    70 - 86                                                          FL/HC:      22.0   %    19.9 - 21.5  HC:      312.2  mm     G. Age:  35w 0d         65  %    HC/AC:      0.94        0.96 - 1.11  AC:      331.8  mm     G. Age:  37w 1d       > 99  %    FL/BPD:     79.9   %    71 - 87  FL:       68.6  mm     G. Age:  35w 2d         90  %    FL/AC:      20.7   %    20 - 24  HUM:      58.8  mm      G. Age:  34w 0d         82  %  CER:        49  mm     G. Age:  36w 4d     > 97.7  %  CM:        5.1  mm  Est. FW:    2852  gm      6 lb 5 oz   > 99  % ---------------------------------------------------------------------- OB History  Gravidity:    2         Term:   0        Prem:   0        SAB:   1  TOP:          0       Ectopic:  0        Living: 0 ---------------------------------------------------------------------- Gestational Age  LMP:           41w 6d        Date:  06/21/21                  EDD:   03/28/22  Clinical EDD:  33w 0d                                        EDD:   05/29/22  U/S Today:     35w 4d                                        EDD:   05/11/22  Best:          33w 0d     Det. By:  Clinical EDD             EDD:   05/29/22 ---------------------------------------------------------------------- Anatomy  Cranium:               Appears normal         LVOT:                   Not well visualized  Cavum:                 Appears normal         Aortic Arch:            Not well visualized  Ventricles:            Not well visualized    Ductal Arch:            Not well visualized  Choroid Plexus:        Not well visualized    Diaphragm:              Appears normal  Cerebellum:            Appears normal         Stomach:                Appears normal, left  sided  Posterior Fossa:       Appears normal         Abdomen:                Appears normal  Nuchal Fold:           Not applicable (Q000111Q    Abdominal Wall:         Not well visualized                         wks GA)  Face:                  Orbits nl; profile not Cord Vessels:           Not well visualized                         well visualized  Lips:                  Not well visualized    Kidneys:                Appear normal  Palate:                Not well visualized    Bladder:                Appears normal  Thoracic:              Appears normal         Spine:                  Not well  visualized  Heart:                 Not well visualized    Upper Extremities:      Appears normal (                                                                        left arm)  RVOT:                  Not well visualized    Lower Extremities:      Appears normal                                                                        (lower legs)  Other:  Cardiac anatomy, Face, N/L, CP, LV, Spine, ACI, 3VC, Rt arm, feet          nonvisualized. Gender nonvisualized. ---------------------------------------------------------------------- Comments  This patient was seen due to pregestational diabetes that is  treated with metformin, Tresiba, and Humalog along with  maternal obesity with a BMI of 49.  The patient recently  transferred her care for delivery at [facility].  Her  hemoglobin A1c was 7.5% last month.  She reports that her  hemoglobin A1c was 7.2% today.  She reports that she  has a fetal echocardiogram scheduled  early next week.  She did not have any screening tests for fetal aneuploidy  drawn in her current pregnancy.  On today's exam, the overall EFW of 6 pounds 5 ounces  measures greater than the 99th percentile for her gestational  age.  There was normal amniotic fluid noted.  The views of the fetal anatomy were limited today due to her  advanced gestational age and maternal body habitus.  The patient was informed that anomalies may be missed due  to technical limitations. If the fetus is in a suboptimal position  or maternal habitus is increased, visualization of the fetus in  the maternal uterus may be impaired.  A BPP performed today was 8 out of 8.  Due to potentially uncontrolled pregestational diabetes, we  will continue to follow her with weekly fetal testing until  delivery.  She will return in 1 week for a BPP.  Due to the large for gestational age fetus and uncontrolled  pregestational diabetes, delivery should be considered at  around 61 weeks.  We will perform another growth scan in 3 weeks.  ----------------------------------------------------------------------                  Johnell Comings, MD Electronically Signed Final Report   04/10/2022 04:46 pm ----------------------------------------------------------------------  [place] MFM FETAL BPP WO NON STRESS  Result Date: 04/10/2022 ----------------------------------------------------------------------  OBSTETRICS REPORT                       (Signed Final 04/10/2022 04:46 pm) ---------------------------------------------------------------------- Patient Info  ID #:       [id number]                          D.O.B.:  May 17, 2000 (21 yrs)  Name:       Lydia Estrada                 Visit Date: 04/10/2022 02:49 pm ---------------------------------------------------------------------- Performed By  Attending:        Johnell Comings MD         Ref. Address:     [address]                                                             [place], [place]  Performed By:     Milus Glazier,      [facility]                                                             [facility]  Referred By:      [facility] ---------------------------------------------------------------------- Orders  #  Description                           Code        Ordered By  1  [place] MFM OB DETAIL +14 WK               [id number]    Darron Doom  2  [place] MFM FETAL BPP WO NON               [id number]    MATTHEW ECKSTAT     STRESS ----------------------------------------------------------------------  #  Order #                     Accession #                Episode #  1  [id number]                   [id number]                 [id number]  2  [id number]                   [id number]                 [id number]  ---------------------------------------------------------------------- Indications  Pre-existing diabetes, type 1, in pregnancy,   O24.013  third trimester  Obesity complicating pregnancy, third          O99.213  trimester (BMI 49)  [redacted] weeks gestation of pregnancy                Z3A.33 ---------------------------------------------------------------------- Fetal Evaluation  Num Of Fetuses:         1  Fetal Heart Rate(bpm):  150  Cardiac Activity:       Observed  Presentation:           Cephalic  Placenta:               Anterior  P. Cord Insertion:      Visualized  Amniotic Fluid  AFI FV:      Within normal limits  AFI Sum(cm)     %Tile       Largest Pocket(cm)  17.39           64          5.87  RUQ(cm)       RLQ(cm)       LUQ(cm)        LLQ(cm)  5.87          5.41          2.6            3.51 ---------------------------------------------------------------------- Biophysical Evaluation  Amniotic F.V:   Within normal limits       F. Tone:        Observed  F. Movement:    Observed  Score:          8/8  F. Breathing:   Observed ---------------------------------------------------------------------- Biometry  BPD:      85.9  mm     G. Age:  34w 4d         87  %    CI:        76.05   %    70 - 86                                                          FL/HC:      22.0   %    19.9 - 21.5  HC:      312.2  mm     G. Age:  35w 0d         65  %    HC/AC:      0.94        0.96 - 1.11  AC:      331.8  mm     G. Age:  37w 1d       > 99  %    FL/BPD:     79.9   %    71 - 87  FL:       68.6  mm     G. Age:  35w 2d         90  %    FL/AC:      20.7   %    20 - 24  HUM:      58.8  mm     G. Age:  34w 0d         82  %  CER:        49  mm     G. Age:  36w 4d     > 97.7  %  CM:        5.1  mm  Est. FW:    2852  gm      6 lb 5 oz   > 99  % ---------------------------------------------------------------------- OB History  Gravidity:    2         Term:   0        Prem:   0        SAB:   1  TOP:          0       Ectopic:  0         Living: 0 ---------------------------------------------------------------------- Gestational Age  LMP:           41w 6d        Date:  06/21/21                  EDD:   03/28/22  Clinical EDD:  33w 0d                                        EDD:   05/29/22  U/S Today:     35w 4d  EDD:   05/11/22  Best:          33w 0d     Det. By:  Clinical EDD             EDD:   05/29/22 ---------------------------------------------------------------------- Anatomy  Cranium:               Appears normal         LVOT:                   Not well visualized  Cavum:                 Appears normal         Aortic Arch:            Not well visualized  Ventricles:            Not well visualized    Ductal Arch:            Not well visualized  Choroid Plexus:        Not well visualized    Diaphragm:              Appears normal  Cerebellum:            Appears normal         Stomach:                Appears normal, left                                                                        sided  Posterior Fossa:       Appears normal         Abdomen:                Appears normal  Nuchal Fold:           Not applicable (Q000111Q    Abdominal Wall:         Not well visualized                         wks GA)  Face:                  Orbits nl; profile not Cord Vessels:           Not well visualized                         well visualized  Lips:                  Not well visualized    Kidneys:                Appear normal  Palate:                Not well visualized    Bladder:                Appears normal  Thoracic:              Appears normal         Spine:  Not well visualized  Heart:                 Not well visualized    Upper Extremities:      Appears normal (                                                                        left arm)  RVOT:                  Not well visualized    Lower Extremities:      Appears normal                                                                         (lower legs)  Other:  Cardiac anatomy, Face, N/L, CP, LV, Spine, ACI, 3VC, Rt arm, feet          nonvisualized. Gender nonvisualized. ---------------------------------------------------------------------- Comments  This patient was seen due to pregestational diabetes that is  treated with metformin, Tresiba, and Humalog along with  maternal obesity with a BMI of 49.  The patient recently  transferred her care for delivery at [facility].  Her  hemoglobin A1c was 7.5% last month.  She reports that her  hemoglobin A1c was 7.2% today.  She reports that she has a fetal echocardiogram scheduled  early next week.  She did not have any screening tests for fetal aneuploidy  drawn in her current pregnancy.  On today's exam, the overall EFW of 6 pounds 5 ounces  measures greater than the 99th percentile for her gestational  age.  There was normal amniotic fluid noted.  The views of the fetal anatomy were limited today due to her  advanced gestational age and maternal body habitus.  The patient was informed that anomalies may be missed due  to technical limitations. If the fetus is in a suboptimal position  or maternal habitus is increased, visualization of the fetus in  the maternal uterus may be impaired.  A BPP performed today was 8 out of 8.  Due to potentially uncontrolled pregestational diabetes, we  will continue to follow her with weekly fetal testing until  delivery.  She will return in 1 week for a BPP.  Due to the large for gestational age fetus and uncontrolled  pregestational diabetes, delivery should be considered at  around 21 weeks.  We will perform another growth scan in 3 weeks. ----------------------------------------------------------------------                  Johnell Comings, MD Electronically Signed Final Report   04/10/2022 04:46 pm ----------------------------------------------------------------------  US FETAL BPP W/NONSTRESS  Result Date:  04/03/2022 ----------------------------------------------------------------------  OBSTETRICS REPORT                       (Signed Final 04/03/2022 03:15 pm) ---------------------------------------------------------------------- Patient Info  ID #:       [id number]  D.O.B.:  2000-07-03 (21 yrs)  Name:       Lydia Estrada                 Visit Date: 04/03/2022 02:52 pm ---------------------------------------------------------------------- Performed By  Attending:        Clayton Lefort MD     Ref. Address:     [address]                                                             [place], [place]  Performed By:     Shauna Hugh Day RNC          Location:         [facility]                                                             [place] [facility]                                                             [place] for                                                             Women  Referred By:      [facility]                    for Women ---------------------------------------------------------------------- Orders  #  Description                           Code        Ordered By  1  US FETAL BPP W/NONSTRESS              [id number]     Clayton Lefort ----------------------------------------------------------------------  #  Order #                     Accession #  Episode #  1  [id number]                   [id number]                 [id number] ---------------------------------------------------------------------- Service(s) Provided  US Fetal BPP W NST                                    [phone] ---------------------------------------------------------------------- Indications  Weeks of gestation of pregnancy not            Z3A.00  specified  Pre-existing diabetes, type 1, in pregnancy,   O24.013  third trimester  Obesity complicating pregnancy, third           O99.213  trimester ---------------------------------------------------------------------- Fetal Evaluation  Num Of Fetuses:         1  Preg. Location:         Intrauterine  Cardiac Activity:       Observed  Presentation:           Cephalic  Amniotic Fluid  AFI FV:      Within normal limits  AFI Sum(cm)                 Largest Pocket(cm)  20.94                       7.15  RUQ(cm)       RLQ(cm)       LUQ(cm)        LLQ(cm)  5.74          3.9           7.15           4.15 ---------------------------------------------------------------------- Biophysical Evaluation  Amniotic F.V:   Pocket => 2 cm             F. Tone:        Observed  F. Movement:    Observed                   N.S.T:          Reactive  F. Breathing:   Observed                   Score:          10/10 ---------------------------------------------------------------------- Impression  Antenatal testing due to type 1 DM.  Testing is reassuring, BPP 10/10. ---------------------------------------------------------------------- Recommendations  Continue weekly antenatal testing till delivery . ----------------------------------------------------------------------               Clayton Lefort, MD Electronically Signed Final Report   04/03/2022 03:15 pm ----------------------------------------------------------------------   Assessment and Plan:  Pregnancy: G2P0010 at 21w0d1. Supervision of high risk pregnancy in third trimester Good fetal movement noted  2. Group B Streptococcus carrier, +RV culture, currently pregnant Will need treatment in labor  3. Type 1 diabetes mellitus during pregnancy, third trimester No book today, cannot access her Dexcom app. Will try to send back via MyChart  4. Excessive fetal growth affecting management of pregnancy in third trimester, single or unspecified fetus For IOL @ 37 weeks   5. URI For cold and allergy symptoms, advised may take Mucinex, (Allegra, Claritin, Zyrtec, Xyzal, Clarinex-any one of these--they  are the same class--same as Benadryl), Sudafed (must be behind the counter, should not be phenylephrine), saline or steroid (Nasacort, Nasonex and Flonase) nasal sprays.  Preterm labor symptoms and general obstetric precautions including but not limited to vaginal bleeding, contractions, leaking of fluid and fetal movement were reviewed in detail with the patient. I discussed the assessment and treatment plan with the patient. The patient was provided an opportunity to ask questions and all were answered. The patient agreed with the plan and demonstrated an understanding of the instructions. The patient was advised to call back or seek an in-person office evaluation/go to [facility] at [facility] for any urgent or concerning symptoms. Please refer to After Visit Summary for other counseling recommendations.   I provided 6 minutes of face-to-face time during this encounter.  Return in 1 week (on 05/08/2022) for [facility].  Future Appointments  Date Time Provider [place]  05/07/2022  3:30 PM [facility] NURSE [facility]  05/07/2022  3:45 PM [facility] US6 [facility]  05/08/2022 12:00 AM [facility] SCHED ROOM [facility] None  05/23/2022  9:00 AM Norman Clay, MD [facility] None    Donnamae Jude, MD Center for [facility] [place]

## 2022-05-01 NOTE — Telephone Encounter (Signed)
Patient shared Dexcom data with [facility]. Pt states she usually feels more confident in using her insulin therapy to control blood sugar, but for the last 4 days patients states she has been having trouble controlling blood sugar due to illness. CGM data shows glucose range 40 mg/dL to high 200s. Her 14-day average was 148 mg/dL with standard deviation of 59 mg/dL.  Pt states that since January Eilene Ghazi, FNP at [facility], has been managing her T1D. Pt reports prior to Jan her A1c was 7.4% and it has come down to 7.2% in a short time. Pt states her next visit with Janett Billow, March 8, is after her scheduled delivery date and wasn't sure if she should reschedule that appointment. I told patient I would relay this information to the Ob who saw her this morning.  Graph to show some trends in her blood sugar past 2 weeks, there is not a clear pattern of when to expect blood excursions. Pt was encouraged to continue meeting with Janett Billow often and that I could also be a resource to help her manage her diabetes.   Best day of control over last 2 weeks:

## 2022-05-02 ENCOUNTER — Telehealth (INDEPENDENT_AMBULATORY_CARE_PROVIDER_SITE_OTHER): Payer: Self-pay | Admitting: Family

## 2022-05-02 NOTE — Telephone Encounter (Signed)
  Name of who is calling: Amador Cunas Relationship to Patient: Lydia Estrada contact number: [phone]  Provider they see: Hermenia Bers   Reason for call: Romie Minus was calling to follow up on a order for [facility] G6 Transmitter. She also states that provider should fill out section "B" on form.      PRESCRIPTION REFILL ONLY  Name of prescription:  Pharmacy:

## 2022-05-03 ENCOUNTER — Other Ambulatory Visit (INDEPENDENT_AMBULATORY_CARE_PROVIDER_SITE_OTHER): Payer: Self-pay | Admitting: Family

## 2022-05-03 ENCOUNTER — Other Ambulatory Visit (HOSPITAL_COMMUNITY): Payer: Self-pay

## 2022-05-05 ENCOUNTER — Other Ambulatory Visit: Payer: Self-pay

## 2022-05-05 ENCOUNTER — Other Ambulatory Visit (HOSPITAL_COMMUNITY): Payer: Self-pay

## 2022-05-05 NOTE — Progress Notes (Signed)
Alert received through NCNotify system as follows: Patient identified as not having a recommended prenatal visit. Patient is 36 weeks and 2 days pregnant. Most recent prenatal visit was on 04/18/2022 at [place].  Chart review: Patient's last visit was on 05/01/2022. Pt scheduled for induction 03/07.  No action needed.  Martina Sinner, RN 05/05/2022  12:09 PM

## 2022-05-05 NOTE — Telephone Encounter (Signed)
Patient is no longer with practice. She is adult. This has been told to [facility] and faxed.

## 2022-05-06 ENCOUNTER — Other Ambulatory Visit (HOSPITAL_COMMUNITY): Payer: Self-pay

## 2022-05-06 ENCOUNTER — Telehealth (INDEPENDENT_AMBULATORY_CARE_PROVIDER_SITE_OTHER): Payer: Self-pay | Admitting: Family

## 2022-05-06 NOTE — Telephone Encounter (Signed)
  Name of who is calling: Romie Minus from [facility] Relationship to Patient:  Best contact number: [phone] or fax [phone]  Provider they see: Leafy Ro  Reason for call: Calling about the status for the detailed written order of the Dexcom G7, it is scheduled to expire in 2 business days. Faxed 2/26. Also req filling out the clinical indication on the other form. Please call back or fax it.  Please provide patient Acct # [id number] when calling back.     PRESCRIPTION REFILL ONLY  Name of prescription:  Pharmacy:

## 2022-05-06 NOTE — Telephone Encounter (Signed)
SHE IS NO LONGER A PATIENT HERE.

## 2022-05-07 ENCOUNTER — Ambulatory Visit: Payer: Commercial Managed Care - PPO | Admitting: *Deleted

## 2022-05-07 ENCOUNTER — Encounter: Payer: Self-pay | Admitting: *Deleted

## 2022-05-07 ENCOUNTER — Ambulatory Visit (HOSPITAL_BASED_OUTPATIENT_CLINIC_OR_DEPARTMENT_OTHER): Payer: Commercial Managed Care - PPO

## 2022-05-07 ENCOUNTER — Other Ambulatory Visit: Payer: Self-pay | Admitting: Advanced Practice Midwife

## 2022-05-07 VITALS — BP 128/71 | HR 69

## 2022-05-07 DIAGNOSIS — O0993 Supervision of high risk pregnancy, unspecified, third trimester: Secondary | ICD-10-CM | POA: Insufficient documentation

## 2022-05-07 DIAGNOSIS — Z3A36 36 weeks gestation of pregnancy: Secondary | ICD-10-CM

## 2022-05-07 DIAGNOSIS — O99820 Streptococcus B carrier state complicating pregnancy: Secondary | ICD-10-CM | POA: Insufficient documentation

## 2022-05-07 DIAGNOSIS — E669 Obesity, unspecified: Secondary | ICD-10-CM | POA: Diagnosis not present

## 2022-05-07 DIAGNOSIS — O99213 Obesity complicating pregnancy, third trimester: Secondary | ICD-10-CM | POA: Diagnosis not present

## 2022-05-07 DIAGNOSIS — E109 Type 1 diabetes mellitus without complications: Secondary | ICD-10-CM | POA: Diagnosis not present

## 2022-05-07 DIAGNOSIS — O24013 Pre-existing diabetes mellitus, type 1, in pregnancy, third trimester: Secondary | ICD-10-CM | POA: Insufficient documentation

## 2022-05-08 ENCOUNTER — Ambulatory Visit: Payer: Commercial Managed Care - PPO

## 2022-05-08 ENCOUNTER — Inpatient Hospital Stay (HOSPITAL_COMMUNITY): Payer: Commercial Managed Care - PPO | Admitting: Anesthesiology

## 2022-05-08 ENCOUNTER — Inpatient Hospital Stay (HOSPITAL_COMMUNITY): Payer: Commercial Managed Care - PPO

## 2022-05-08 ENCOUNTER — Other Ambulatory Visit (HOSPITAL_COMMUNITY): Payer: Self-pay

## 2022-05-08 ENCOUNTER — Encounter: Payer: Self-pay | Admitting: Family Medicine

## 2022-05-08 ENCOUNTER — Other Ambulatory Visit: Payer: Self-pay

## 2022-05-08 ENCOUNTER — Inpatient Hospital Stay (HOSPITAL_COMMUNITY)
Admission: RE | Admit: 2022-05-08 | Discharge: 2022-05-13 | DRG: 787 | Disposition: A | Discharge status: Home / Self Care | Payer: Commercial Managed Care - PPO | Attending: Family Medicine | Admitting: Family Medicine

## 2022-05-08 ENCOUNTER — Encounter (HOSPITAL_COMMUNITY): Payer: Self-pay | Admitting: Family Medicine

## 2022-05-08 DIAGNOSIS — D62 Acute posthemorrhagic anemia: Secondary | ICD-10-CM | POA: Diagnosis not present

## 2022-05-08 DIAGNOSIS — Z3A37 37 weeks gestation of pregnancy: Secondary | ICD-10-CM

## 2022-05-08 DIAGNOSIS — O9902 Anemia complicating childbirth: Secondary | ICD-10-CM | POA: Diagnosis present

## 2022-05-08 DIAGNOSIS — A6000 Herpesviral infection of urogenital system, unspecified: Secondary | ICD-10-CM | POA: Diagnosis present

## 2022-05-08 DIAGNOSIS — O0990 Supervision of high risk pregnancy, unspecified, unspecified trimester: Secondary | ICD-10-CM

## 2022-05-08 DIAGNOSIS — O99214 Obesity complicating childbirth: Secondary | ICD-10-CM | POA: Diagnosis not present

## 2022-05-08 DIAGNOSIS — Z7984 Long term (current) use of oral hypoglycemic drugs: Secondary | ICD-10-CM

## 2022-05-08 DIAGNOSIS — O2402 Pre-existing diabetes mellitus, type 1, in childbirth: Principal | ICD-10-CM | POA: Diagnosis present

## 2022-05-08 DIAGNOSIS — O3660X0 Maternal care for excessive fetal growth, unspecified trimester, not applicable or unspecified: Secondary | ICD-10-CM | POA: Diagnosis present

## 2022-05-08 DIAGNOSIS — F332 Major depressive disorder, recurrent severe without psychotic features: Secondary | ICD-10-CM | POA: Diagnosis not present

## 2022-05-08 DIAGNOSIS — Z98891 History of uterine scar from previous surgery: Secondary | ICD-10-CM

## 2022-05-08 DIAGNOSIS — O9832 Other infections with a predominantly sexual mode of transmission complicating childbirth: Secondary | ICD-10-CM | POA: Diagnosis present

## 2022-05-08 DIAGNOSIS — O99820 Streptococcus B carrier state complicating pregnancy: Secondary | ICD-10-CM

## 2022-05-08 DIAGNOSIS — O24019 Pre-existing diabetes mellitus, type 1, in pregnancy, unspecified trimester: Secondary | ICD-10-CM

## 2022-05-08 DIAGNOSIS — Z794 Long term (current) use of insulin: Secondary | ICD-10-CM | POA: Diagnosis not present

## 2022-05-08 DIAGNOSIS — O99824 Streptococcus B carrier state complicating childbirth: Secondary | ICD-10-CM | POA: Diagnosis present

## 2022-05-08 DIAGNOSIS — F900 Attention-deficit hyperactivity disorder, predominantly inattentive type: Secondary | ICD-10-CM | POA: Diagnosis present

## 2022-05-08 DIAGNOSIS — Z886 Allergy status to analgesic agent status: Secondary | ICD-10-CM | POA: Diagnosis not present

## 2022-05-08 DIAGNOSIS — O99815 Abnormal glucose complicating the puerperium: Secondary | ICD-10-CM | POA: Diagnosis not present

## 2022-05-08 DIAGNOSIS — Z30017 Encounter for initial prescription of implantable subdermal contraceptive: Secondary | ICD-10-CM | POA: Diagnosis not present

## 2022-05-08 DIAGNOSIS — Z87891 Personal history of nicotine dependence: Secondary | ICD-10-CM | POA: Diagnosis not present

## 2022-05-08 DIAGNOSIS — E10649 Type 1 diabetes mellitus with hypoglycemia without coma: Secondary | ICD-10-CM | POA: Diagnosis not present

## 2022-05-08 DIAGNOSIS — R768 Other specified abnormal immunological findings in serum: Secondary | ICD-10-CM | POA: Diagnosis present

## 2022-05-08 DIAGNOSIS — O3663X0 Maternal care for excessive fetal growth, third trimester, not applicable or unspecified: Secondary | ICD-10-CM | POA: Diagnosis not present

## 2022-05-08 DIAGNOSIS — R894 Abnormal immunological findings in specimens from other organs, systems and tissues: Secondary | ICD-10-CM | POA: Diagnosis present

## 2022-05-08 DIAGNOSIS — E669 Obesity, unspecified: Secondary | ICD-10-CM | POA: Diagnosis not present

## 2022-05-08 DIAGNOSIS — O24913 Unspecified diabetes mellitus in pregnancy, third trimester: Principal | ICD-10-CM | POA: Diagnosis present

## 2022-05-08 DIAGNOSIS — Z7982 Long term (current) use of aspirin: Secondary | ICD-10-CM | POA: Diagnosis not present

## 2022-05-08 DIAGNOSIS — O0933 Supervision of pregnancy with insufficient antenatal care, third trimester: Secondary | ICD-10-CM

## 2022-05-08 DIAGNOSIS — Z3A00 Weeks of gestation of pregnancy not specified: Secondary | ICD-10-CM | POA: Diagnosis not present

## 2022-05-08 DIAGNOSIS — O24429 Gestational diabetes mellitus in childbirth, unspecified control: Secondary | ICD-10-CM | POA: Diagnosis not present

## 2022-05-08 DIAGNOSIS — O24424 Gestational diabetes mellitus in childbirth, insulin controlled: Secondary | ICD-10-CM | POA: Diagnosis not present

## 2022-05-08 DIAGNOSIS — O24013 Pre-existing diabetes mellitus, type 1, in pregnancy, third trimester: Secondary | ICD-10-CM

## 2022-05-08 DIAGNOSIS — O99344 Other mental disorders complicating childbirth: Secondary | ICD-10-CM | POA: Diagnosis present

## 2022-05-08 HISTORY — DX: Adjustment disorder, unspecified: F43.20

## 2022-05-08 LAB — COMPREHENSIVE METABOLIC PANEL
ALT: 8 U/L (ref 0–44)
AST: 15 U/L (ref 15–41)
Albumin: 1.9 g/dL — ABNORMAL LOW (ref 3.5–5.0)
Alkaline Phosphatase: 120 U/L (ref 38–126)
Anion gap: 9 (ref 5–15)
BUN: 7 mg/dL (ref 6–20)
CO2: 20 mmol/L — ABNORMAL LOW (ref 22–32)
Calcium: 8.3 mg/dL — ABNORMAL LOW (ref 8.9–10.3)
Chloride: 108 mmol/L (ref 98–111)
Creatinine, Ser: 0.67 mg/dL (ref 0.44–1.00)
GFR, Estimated: >60 mL/min (ref >60)
Glucose, Bld: 84 mg/dL (ref 70–99)
Potassium: 3.6 mmol/L (ref 3.5–5.1)
Sodium: 137 mmol/L (ref 135–145)
Total Bilirubin: 0.3 mg/dL (ref 0.3–1.2)
Total Protein: 5.4 g/dL — ABNORMAL LOW (ref 6.5–8.1)

## 2022-05-08 LAB — CBC
HCT: 36.4 % (ref 36.0–46.0)
Hemoglobin: 12 g/dL (ref 12.0–15.0)
MCH: 27 pg (ref 26.0–34.0)
MCHC: 33 g/dL (ref 30.0–36.0)
MCV: 81.8 fL (ref 80.0–100.0)
Platelets: 246 10*3/uL (ref 150–400)
RBC: 4.45 MIL/uL (ref 3.87–5.11)
RDW: 13.5 % (ref 11.5–15.5)
WBC: 9.9 10*3/uL (ref 4.0–10.5)
nRBC: 0 % (ref 0.0–0.2)

## 2022-05-08 LAB — BASIC METABOLIC PANEL
Anion gap: 8 (ref 5–15)
BUN: 7 mg/dL (ref 6–20)
CO2: 21 mmol/L — ABNORMAL LOW (ref 22–32)
Calcium: 8.3 mg/dL — ABNORMAL LOW (ref 8.9–10.3)
Chloride: 109 mmol/L (ref 98–111)
Creatinine, Ser: 0.77 mg/dL (ref 0.44–1.00)
GFR, Estimated: >60 mL/min (ref >60)
Glucose, Bld: 92 mg/dL (ref 70–99)
Potassium: 3.4 mmol/L — ABNORMAL LOW (ref 3.5–5.1)
Sodium: 138 mmol/L (ref 135–145)

## 2022-05-08 LAB — GLUCOSE, CAPILLARY
Glucose-Capillary: 80 mg/dL (ref 70–99)
Glucose-Capillary: 99 mg/dL (ref 70–99)
Glucose-Capillary: 101 mg/dL — ABNORMAL HIGH (ref 70–99)
Glucose-Capillary: 94 mg/dL (ref 70–99)
Glucose-Capillary: 105 mg/dL — ABNORMAL HIGH (ref 70–99)
Glucose-Capillary: 119 mg/dL — ABNORMAL HIGH (ref 70–99)
Glucose-Capillary: 87 mg/dL (ref 70–99)
Glucose-Capillary: 77 mg/dL (ref 70–99)
Glucose-Capillary: 71 mg/dL (ref 70–99)
Glucose-Capillary: 88 mg/dL (ref 70–99)

## 2022-05-08 LAB — TYPE AND SCREEN
ABO/RH(D): A POS
Antibody Screen: NEGATIVE

## 2022-05-08 LAB — RPR: RPR Ser Ql: NONREACTIVE

## 2022-05-08 MED ORDER — OXYCODONE-ACETAMINOPHEN 5-325 MG PO TABS: 1.0000 | ORAL_TABLET | ORAL | Status: DC | PRN

## 2022-05-08 MED ORDER — DEXTROSE IN LACTATED RINGERS 5 % IV SOLN: INTRAVENOUS | Status: DC

## 2022-05-08 MED ORDER — ACETAMINOPHEN 325 MG PO TABS: 650.0000 mg | ORAL_TABLET | ORAL | Status: DC | PRN

## 2022-05-08 MED ORDER — TERBUTALINE SULFATE 1 MG/ML IJ SOLN
0.2500 mg | Freq: Once | INTRAMUSCULAR | Status: AC | PRN
  Administered 2022-05-09: 0.25 mg via SUBCUTANEOUS

## 2022-05-08 MED ORDER — LACTATED RINGERS IV SOLN
500.0000 mL | INTRAVENOUS | Status: DC | PRN
  Administered 2022-05-08 (×2): 500 mL via INTRAVENOUS

## 2022-05-08 MED ORDER — NALBUPHINE HCL 10 MG/ML IJ SOLN
5.0000 mg | Freq: Once | INTRAMUSCULAR | Status: AC
  Administered 2022-05-08: 5 mg via INTRAVENOUS
  Filled 2022-05-08: qty 1

## 2022-05-08 MED ORDER — LIDOCAINE HCL (PF) 1 % IJ SOLN: 30.0000 mL | INTRAMUSCULAR | Status: DC | PRN

## 2022-05-08 MED ORDER — OXYCODONE-ACETAMINOPHEN 5-325 MG PO TABS: 2.0000 | ORAL_TABLET | ORAL | Status: DC | PRN

## 2022-05-08 MED ORDER — MISOPROSTOL 50MCG HALF TABLET
50.0000 ug | ORAL_TABLET | Freq: Once | ORAL | Status: AC
  Administered 2022-05-08: 50 ug via ORAL
  Filled 2022-05-08: qty 1

## 2022-05-08 MED ORDER — INSULIN REGULAR(HUMAN) IN NACL 100-0.9 UT/100ML-% IV SOLN
INTRAVENOUS | Status: DC
  Administered 2022-05-08: 1 [IU]/h via INTRAVENOUS
  Administered 2022-05-08: 0.8 [IU]/h via INTRAVENOUS
  Filled 2022-05-08: qty 100

## 2022-05-08 MED ORDER — FENTANYL CITRATE (PF) 100 MCG/2ML IJ SOLN
100.0000 ug | INTRAMUSCULAR | Status: DC | PRN
  Administered 2022-05-08 (×2): 100 ug via INTRAVENOUS
  Filled 2022-05-08 (×2): qty 2

## 2022-05-08 MED ORDER — DIPHENHYDRAMINE HCL 50 MG/ML IJ SOLN
12.5000 mg | INTRAMUSCULAR | Status: DC | PRN
  Administered 2022-05-08: 12.5 mg via INTRAVENOUS
  Filled 2022-05-08: qty 1

## 2022-05-08 MED ORDER — PENICILLIN G POT IN DEXTROSE 60000 UNIT/ML IV SOLN
3.0000 10*6.[IU] | INTRAVENOUS | Status: DC
  Administered 2022-05-08 – 2022-05-09 (×7): 3 10*6.[IU] via INTRAVENOUS
  Filled 2022-05-08 (×7): qty 50

## 2022-05-08 MED ORDER — SOD CITRATE-CITRIC ACID 500-334 MG/5ML PO SOLN
30.0000 mL | ORAL | Status: DC | PRN
  Filled 2022-05-08: qty 30

## 2022-05-08 MED ORDER — OXYTOCIN-SODIUM CHLORIDE 30-0.9 UT/500ML-% IV SOLN
1.0000 m[IU]/min | INTRAVENOUS | Status: DC
  Administered 2022-05-08: 1 m[IU]/min via INTRAVENOUS
  Filled 2022-05-08: qty 500

## 2022-05-08 MED ORDER — FENTANYL-BUPIVACAINE-NACL 0.5-0.125-0.9 MG/250ML-% EP SOLN
12.0000 mL/h | EPIDURAL | Status: DC | PRN
  Administered 2022-05-08: 12 mL/h via EPIDURAL
  Filled 2022-05-08 (×2): qty 250

## 2022-05-08 MED ORDER — EPHEDRINE 5 MG/ML INJ: 10.0000 mg | INTRAVENOUS | Status: DC | PRN

## 2022-05-08 MED ORDER — SODIUM CHLORIDE 0.9 % IV SOLN
5.0000 10*6.[IU] | Freq: Once | INTRAVENOUS | Status: AC
  Administered 2022-05-08: 5 10*6.[IU] via INTRAVENOUS
  Filled 2022-05-08: qty 5

## 2022-05-08 MED ORDER — OXYTOCIN-SODIUM CHLORIDE 30-0.9 UT/500ML-% IV SOLN: 2.5000 [IU]/h | INTRAVENOUS | Status: DC

## 2022-05-08 MED ORDER — FLEET ENEMA 7-19 GM/118ML RE ENEM: 1.0000 | ENEMA | RECTAL | Status: DC | PRN

## 2022-05-08 MED ORDER — ONDANSETRON HCL 4 MG/2ML IJ SOLN
4.0000 mg | Freq: Four times a day (QID) | INTRAMUSCULAR | Status: DC | PRN
  Administered 2022-05-08 – 2022-05-09 (×4): 4 mg via INTRAVENOUS
  Filled 2022-05-08 (×4): qty 2

## 2022-05-08 MED ORDER — TERBUTALINE SULFATE 1 MG/ML IJ SOLN
0.2500 mg | Freq: Once | INTRAMUSCULAR | Status: DC | PRN
  Filled 2022-05-08: qty 1

## 2022-05-08 MED ORDER — LACTATED RINGERS IV SOLN: INTRAVENOUS | Status: DC

## 2022-05-08 MED ORDER — MISOPROSTOL 25 MCG QUARTER TABLET
25.0000 ug | ORAL_TABLET | Freq: Once | ORAL | Status: AC
  Administered 2022-05-08: 25 ug via VAGINAL
  Filled 2022-05-08: qty 1

## 2022-05-08 MED ORDER — OXYTOCIN BOLUS FROM INFUSION: 333.0000 mL | Freq: Once | INTRAVENOUS | Status: DC

## 2022-05-08 MED ORDER — DEXTROSE 50 % IV SOLN
0.0000 mL | INTRAVENOUS | Status: DC | PRN
  Administered 2022-05-08: 50 mL via INTRAVENOUS
  Filled 2022-05-08: qty 50

## 2022-05-08 MED ORDER — PHENYLEPHRINE 80 MCG/ML (10ML) SYRINGE FOR IV PUSH (FOR BLOOD PRESSURE SUPPORT)
80.0000 ug | PREFILLED_SYRINGE | INTRAVENOUS | Status: DC | PRN
  Filled 2022-05-08: qty 10

## 2022-05-08 MED ORDER — LIDOCAINE HCL (PF) 1 % IJ SOLN
INTRAMUSCULAR | Status: DC | PRN
  Administered 2022-05-08: 11 mL via EPIDURAL

## 2022-05-08 MED ORDER — PHENYLEPHRINE 80 MCG/ML (10ML) SYRINGE FOR IV PUSH (FOR BLOOD PRESSURE SUPPORT): 80.0000 ug | PREFILLED_SYRINGE | INTRAVENOUS | Status: DC | PRN

## 2022-05-08 MED ORDER — LACTATED RINGERS IV SOLN
500.0000 mL | Freq: Once | INTRAVENOUS | Status: AC
  Administered 2022-05-08: 500 mL via INTRAVENOUS

## 2022-05-08 NOTE — Progress Notes (Signed)
Labor Progress Note Lydia Estrada is a 22 y.o. G2P0010 at 82w0dpresented for IOL T1DM  S: Patient feeling comfortable since epidural. No pain with contractions, only feeling temperature and pressure below the waist.   O:  BP 125/76   Pulse 61   Temp 97.6 F (36.4 C) (Oral)   Resp 17   Ht '5\' 8"'$  (1.727 m)   Wt (!) 154.2 kg   LMP 06/21/2021   SpO2 99%   BMI 51.70 kg/m  EFM: 120/moderate/accelerations present, decels absent  CVE: Dilation: Fingertip Effacement (%): Thick Cervical Position: Posterior Station: -3 Presentation: Vertex Exam by:: Dr. RDenman George  A&P: 22y.o. G2P0010 369w0dOL T1DM  #Labor: Pt remains fingertip on CVE. She is contracting frequently but toco not tracing well. Discussed FB, pt agreeable.  #Pain: Epidural #FWB: Cat I #GBS positive s/p 3 doses IV PCN  LyDarlen RoundMD PGY-1 1:30 PM

## 2022-05-08 NOTE — Anesthesia Preprocedure Evaluation (Addendum)
Anesthesia Evaluation  Patient identified by MRN, date of birth, ID band Patient awake    Reviewed: Allergy & Precautions, H&P , NPO status , Patient's Chart, lab work & pertinent test results  Airway Mallampati: III  TM Distance: >3 FB Neck ROM: Full    Dental no notable dental hx. (+) Teeth Intact, Dental Advisory Given   Pulmonary former smoker   Pulmonary exam normal breath sounds clear to auscultation       Cardiovascular negative cardio ROS Normal cardiovascular exam Rhythm:Regular Rate:Normal     Neuro/Psych  PSYCHIATRIC DISORDERS  Depression    negative neurological ROS     GI/Hepatic ,GERD  Medicated,,(+)     substance abuse    Endo/Other  diabetes, Poorly Controlled, Insulin Dependent  Morbid obesityHyperlipidemia  Renal/GU negative Renal ROS  negative genitourinary   Musculoskeletal negative musculoskeletal ROS (+)    Abdominal  (+) + obese  Peds negative pediatric ROS (+)  Hematology negative hematology ROS (+)   Anesthesia Other Findings   Reproductive/Obstetrics (+) Pregnancy                             Anesthesia Physical Anesthesia Plan  ASA: 3  Anesthesia Plan: Epidural   Post-op Pain Management: Regional block* and Minimal or no pain anticipated   Induction:   PONV Risk Score and Plan: 4 or greater and Treatment may vary due to age or medical condition and Scopolamine patch - Pre-op  Airway Management Planned: Natural Airway  Additional Equipment:   Intra-op Plan:   Post-operative Plan:   Informed Consent: I have reviewed the patients History and Physical, chart, labs and discussed the procedure including the risks, benefits and alternatives for the proposed anesthesia with the patient or authorized representative who has indicated his/her understanding and acceptance.       Plan Discussed with: Anesthesiologist and CRNA  Anesthesia Plan Comments:  (Urgent C/Section for Non reassuring FHR tracing)       Anesthesia Quick Evaluation

## 2022-05-08 NOTE — Progress Notes (Signed)
Labor Progress Note Lydia Estrada is a 22 y.o. G2P0010 at 67w0dpresented for IOL T1DM   S:noted to have low blood sugars, with recent CBG 61 per her dexcom. D50W administered per protocol. She is tired, but feels well otherwise.  O:  BP 125/80   Pulse 61   Temp 97.6 F (36.4 C) (Oral)   Resp 17   Ht '5\' 8"'$  (1.727 m)   Wt (!) 154.2 kg   LMP 06/21/2021   SpO2 99%   BMI 51.70 kg/m   EFM: 120bpm, moderate variability, +accels, no decels  Contractions: every 2-3 mins. coupling  CVE: 1.5cm / 70% / station -3   A&P: 22y.o. G2P0010 335w0dOL T1DM  #Labor: Induction continues, s/p Cytotec X 1 round, now on pitocin capped at 6 units. FB placed successfully. Plan to recheck and consider uptitrating pitocin +/- AROM with FB out.  #Pain: Epidural in place and comfortable #FWB: Cat I #GBS positive, IV PCN #T1DM: on endotool. Hypoglycemia treated with D50. Plan to continue close monitoring. Continue D5LR at 125cc/hr #Decreased UOP: Cr normal. Continue monitoring.  Giovannie Scerbo J Sherrilyn RistMD  8:25 PM

## 2022-05-08 NOTE — Progress Notes (Signed)
Labor Progress Note Lydia Estrada is a 22 y.o. G2P0010 at 57w0dpresented for IOL T1DM   S: Patient sitting up in bed, not feeling contractions. RN reports decreased urine output despite boluses.  O:  BP 131/75   Pulse (!) 55   Temp 97.6 F (36.4 C) (Oral)   Resp 17   Ht '5\' 8"'$  (1.727 m)   Wt (!) 154.2 kg   LMP 06/21/2021   SpO2 97%   BMI 51.70 kg/m  EFM: 130/moderate/accelerations present, decels absent  CVE: Dilation: Fingertip Effacement (%): Thick Cervical Position: Posterior Station: -3 Presentation: Vertex Exam by:: Dr. NTrina Ao  A&P: 22y.o. G2P0010 373w0dOL T1DM  #Labor: Remains fingertip on CVE. Attempted to place FB both with speculum and manually but cervical station not favorable. Pt contracting frequently. Will start pitocin 1x1 to further cervical ripening. #Pain: Epidural #FWB: Cat I #GBS positive, IV PCN #T1DM: Endotool, OK to use Dexcom for every other glucose check.  #Decreased UOP: CMP pending  LyDarlen RoundMD PGY-1 4:06 PM

## 2022-05-08 NOTE — H&P (Signed)
OBSTETRIC ADMISSION HISTORY AND PHYSICAL  Lydia Estrada is a 22 y.o. female G30P0010 with IUP at 71w0dby [place] for IOL T1DM. She reports +FMs, No LOF, no VB, no blurry vision, headaches or peripheral edema, and RUQ pain.  She plans on Breast feeding. She request something for birth control, but usnure. [facility]  Dating: By [place]--->  Estimated Date of Delivery: 05/29/22  Sono:   '@[redacted]w[redacted]d'$ , CWD, normal anatomy, cephalic presentation, [id number] >99% EFW   Prenatal History/Complications:  Patient Active Problem List   Diagnosis Date Noted   Diabetes mellitus in pregnancy in third trimester 05/08/2022   Group B Streptococcus carrier, +RV culture, currently pregnant 04/21/2022   LGA (large for gestational age) fetus affecting management of mother 04/10/2022   Type 1 diabetes mellitus during pregnancy, third trimester 03/20/2022   Late prenatal care affecting pregnancy in third trimester 03/20/2022   Supervision of high-risk pregnancy 03/14/2022   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 GAD antibodies markedly positive at 1493 (<5)    Past Surgical History: Past Surgical History:  Procedure Laterality Date   WISDOM TOOTH EXTRACTION      Obstetrical History: OB History     Gravida  2   Para  0   Term  0   Preterm  0   AB  1   Living  0      SAB  1   IAB  0   Ectopic  0   Multiple  0   Live Births   0           Social History Social History   Socioeconomic History   Marital status: Significant Other    Spouse name: Not on file   Number of children: Not on file   Years of education: Not on file   Highest education level: Not on file  Occupational History   Not on file  Tobacco Use   Smoking status: Former    Types: Cigarettes    Passive exposure: Current   Smokeless tobacco: Never  Vaping Use   Vaping Use: Every day   Substances: Nicotine, THC, CBD  Substance and Sexual Activity   Alcohol use: No   Drug use: Yes    Comment: Marijuana   Sexual activity: Not on file  Other Topics Concern   Not on file  Social History Narrative   Lives with boyfriend and 1 cat   No currently working or school    Social Determinants of Health   Financial Resource Strain: Not on file  Food Insecurity: No Food Insecurity (05/08/2022)   Hunger Vital Sign    Worried About Running Out of Food in the Last Year: Never true    Ran Out of Food in the Last Year: Never true  Recent Concern: Food Insecurity - Food Insecurity Present (03/14/2022)   Hunger Vital Sign    Worried About REstate manager/land agentof Food  in the Last Year: Often true    [place] in the Last Year: Sometimes true  Transportation Needs: No Transportation Needs (05/08/2022)   PRAPARE - Hydrologist (Medical): No    Lack of Transportation (Non-Medical): No  Physical Activity: Not on file  Stress: Not on file  Social Connections: Not on file    Family History: Family History  Problem Relation Age of Onset   Schizophrenia Sister    Bipolar disorder Brother    Diabetes Maternal Grandfather    Diabetes Maternal Grandmother     Allergies: Allergies  Allergen Reactions   Bee Venom Anaphylaxis   Pomegranate ([place] Granatum) Anaphylaxis   Grapefruit Concentrate    Ibuprofen     Throat swelling   Naproxen    Other     Seaweed, bee sting (anaphalytic)     Medications Prior to Admission   Medication Sig Dispense Refill Last Dose   aspirin EC (ASPIRIN 81) 81 MG tablet Take 1 tablet (81 mg total) by mouth daily. 30 tablet 11 05/07/2022   buPROPion (WELLBUTRIN XL) 150 MG 24 hr tablet Take 1 tablet (150 mg total) by mouth daily. 30 tablet 5 05/07/2022   buPROPion (WELLBUTRIN XL) 300 MG 24 hr tablet Take 1 tablet (300 mg total) by mouth daily. Take along with '150mg'$  tablet for a total of 450 mg daily. 30 tablet 2 05/07/2022   metFORMIN (GLUCOPHAGE-XR) 500 MG 24 hr tablet TAKE 3 TABLETS BY MOUTH AT BREAKFAST 270 tablet 1 05/07/2022   prazosin (MINIPRESS) 2 MG capsule Take 1 capsule (2 mg total) by mouth at bedtime. 30 capsule 5 05/07/2022   Prenatal Vit-Fe Fumarate-FA (PRENATAL PO) Take 1 tablet by mouth daily.   05/07/2022   promethazine (PHENERGAN) 25 MG tablet Take 1 tablet (25 mg total) by mouth every 6 (six) hours as needed for nausea or vomiting. 30 tablet 1 05/07/2022   sertraline (ZOLOFT) 100 MG tablet Take 2 tablets (200 mg total) by mouth daily. 60 tablet 5 05/07/2022   valACYclovir (VALTREX) 500 MG tablet Take 1 tablet (500 mg total) by mouth 2 (two) times daily. 60 tablet 1 05/07/2022   EPINEPHrine 0.3 mg/0.3 mL IJ SOAJ injection See admin instructions.      Glucagon (BAQSIMI TWO PACK) 3 MG/DOSE POWD Place 1 spray into one nostril once for 1 dose.  May repeat 1 spray in 15 minutes if inadequate response. 2 each 2    Glucagon (GVOKE HYPOPEN 1-PACK) 1 MG/0.2ML SOAJ inject 1 mg by subcutaneous route once in the abdomen, thigh, or upper arm may repeat in 15 minutes if inadequate response 0.2 mL 2    Glucagon (GVOKE HYPOPEN 2-PACK) 1 MG/0.2ML SOAJ Use as directed for hypoglycemia 0.4 mL 1    glucose blood (FREESTYLE LITE) test strip See admin instructions.      hydrOXYzine (ATARAX) 25 MG tablet 1 tablet as needed      imiquimod (ALDARA) 5 % cream Apply topically 3 (three) times a week. 12 each 0    insulin degludec (TRESIBA FLEXTOUCH) 200 UNIT/ML FlexTouch Pen Inject 60 Units into the skin in the  morning and at bedtime. 18 mL 5    insulin lispro (HUMALOG KWIKPEN) 200 UNIT/ML KwikPen Inject 150 Units into the skin daily. Carb counting sliding scale 69 mL 3    insulin lispro (HUMALOG KWIKPEN) 200 UNIT/ML KwikPen Inject 30-40 Units into the skin 3 (three) times daily with meals plus correctional insulin. Max 150 units/day 18  mL 3    Insulin Pen Needle (UNIFINE PENTIPS) 32G X 4 MM MISC USE TO INJECT INSULIN 6 TIMES A DAY 600 each 1      Review of Systems   All systems reviewed and negative except as stated in HPI  Blood pressure (!) 101/47, pulse 93, temperature 98.3 F (36.8 C), temperature source Oral, resp. rate 17, height '5\' 8"'$  (1.727 m), weight (!) 154.2 kg, last menstrual period 06/21/2021, SpO2 97 %. General appearance: alert, cooperative, and appears stated age Lungs: clear to auscultation bilaterally Heart: regular rate and rhythm Abdomen: soft, non-tender; bowel sounds normal Pelvic: no lesions Extremities: Homans sign is negative, no sign of DVT Presentation: cephalic Fetal monitoringBaseline: 140 bpm, Variability: Good {> 6 bpm), Accelerations: Reactive, and Decelerations: Absent Uterine activityFrequency: Every 5 minutes Dilation: Closed Effacement (%):  (thick) Station: Ballotable Exam by:: Dr Dorena Cookey   Prenatal labs: ABO, Rh: --/--/A POS (03/07 0100) Antibody: NEG (03/07 0100) Rubella: Immune (11/15 1012) RPR: Non Reactive (01/12 1100)  HBsAg: Negative (11/15 1012)  HIV: Non Reactive (01/12 1100)  GBS: Positive/-- (02/16 1125)  1 hr Glucola  T1DM Genetic screening  declined Anatomy US LGA  Prenatal Transfer Tool  Maternal Diabetes: Yes:  Diabetes Type:  Pre-pregnancy, Insulin/Medication controlled Genetic Screening: Declined Maternal Ultrasounds/Referrals: Other: LGA Fetal Ultrasounds or other Referrals:  Fetal echo, Referred to Materal Fetal Medicine  Maternal Substance Abuse:  No Significant Maternal Medications:  Meds include: Zoloft Other:   welbutrin Significant Maternal Lab Results:  Group B Strep positive Number of Prenatal Visits:greater than 3 verified prenatal visits Other Comments:  None  Results for orders placed or performed during the hospital encounter of 05/08/22 (from the past 24 hour(s))  CBC   Collection Time: 05/08/22  1:00 AM  Result Value Ref Range   WBC 9.9 4.0 - 10.5 K/uL   RBC 4.45 3.87 - 5.11 MIL/uL   Hemoglobin 12.0 12.0 - 15.0 g/dL   HCT 36.4 36.0 - 46.0 %   MCV 81.8 80.0 - 100.0 fL   MCH 27.0 26.0 - 34.0 pg   MCHC 33.0 30.0 - 36.0 g/dL   RDW 13.5 11.5 - 15.5 %   Platelets 246 150 - 400 K/uL   nRBC 0.0 0.0 - 0.2 %  Type and screen   Collection Time: 05/08/22  1:00 AM  Result Value Ref Range   ABO/RH(D) A POS    Antibody Screen NEG    Sample Expiration      05/11/2022,2359 Performed at [facility], [place] [address], [place], [place] [address]   Glucose, capillary   Collection Time: 05/08/22  1:18 AM  Result Value Ref Range   Glucose-Capillary 80 70 - 99 mg/dL    Patient Active Problem List   Diagnosis Date Noted   Diabetes mellitus in pregnancy in third trimester 05/08/2022   Group B Streptococcus carrier, +RV culture, currently pregnant 04/21/2022   LGA (large for gestational age) fetus affecting management of mother 04/10/2022   Type 1 diabetes mellitus during pregnancy, third trimester 03/20/2022   Late prenatal care affecting pregnancy in third trimester 03/20/2022   Supervision of high-risk pregnancy 03/14/2022   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  G2P0010 at 34w0dhere for IOL T1DM  #Labor: cytotec 50/248m started. Plan to place FB next once pt is more dilated #Pain: Per pt request #FWB: CAT 1  #ID:  GBS pos- PCN, HSV + on valtrex #MOF: Breast #MOC:unsure #T1DM: start endotool. DM coordinator consulted. Tested Dexcom to hospital glucometer  and similar BG results. Will use pt dexcom. BMP ordered. #MDD: continued home welbutrin and zoMidlandDO  05/08/2022, 2:01 AM

## 2022-05-08 NOTE — Progress Notes (Signed)
Patient has dexcom blood sugar monitor.BP at 0420-'121mg'$ /dl.

## 2022-05-08 NOTE — Anesthesia Procedure Notes (Signed)
Epidural Patient location during procedure: OB Start time: 05/08/2022 8:18 AM End time: 05/08/2022 8:34 AM  Staffing Anesthesiologist: Lynda Rainwater, MD Performed: anesthesiologist   Preanesthetic Checklist Completed: patient identified, IV checked, site marked, risks and benefits discussed, surgical consent, monitors and equipment checked, pre-op evaluation and timeout performed  Epidural Patient position: sitting Prep: ChloraPrep Patient monitoring: heart rate, cardiac monitor, continuous pulse ox and blood pressure Approach: midline Location: L2-L3 Injection technique: LOR saline  Needle:  Needle type: Tuohy  Needle gauge: 17 G Needle length: 9 cm Needle insertion depth: 9 cm Catheter type: closed end flexible Catheter size: 20 Guage Catheter at skin depth: 14 cm Test dose: negative  Assessment Events: blood not aspirated, injection not painful, no injection resistance, no paresthesia and negative IV test  Additional Notes Reason for block:procedure for pain

## 2022-05-08 NOTE — Progress Notes (Addendum)
Inpatient Diabetes Program Recommendations  ADA Standards of Care 2023 Diabetes in Pregnancy Target Glucose Ranges:  Fasting: 70 - 95 mg/dL 1 hr postprandial:  110 - '140mg'$ /dL (from first bite of meal) 2 hr postprandial:  100 - 120 mg/dL (from first bit of meal)    Lab Results  Component Value Date   GLUCAP 119 (H) 05/08/2022   HGBA1C 7.5 (H) 03/14/2022    Review of Glycemic Control  Latest Reference Range & Units 05/08/22 01:18 05/08/22 03:40 05/08/22 07:56 05/08/22 09:02 05/08/22 11:06 05/08/22 12:28  Glucose-Capillary 70 - 99 mg/dL 80 99 101 (H) 94 105 (H) 119 (H)   Diabetes history: DM 1 Outpatient Diabetes medications:  Prior to Pregnancy: Tresiba 108 units daily CHO ratio was 1 unit for every 4 grams of CHO Pregnancy:  Tresiba 60 units bid Humalog 30-40 units tid with meals (CHO ratio 1 unit:1 gram CHO) Current orders for Inpatient glycemic control:  IV insulin (goal 90-120 mg/dL)  Inpatient Diabetes Program Recommendations:    Spoke with patient regarding pre-pregnancy insulin regimen- Patient has Type 1 DM and will need basal/correction and meal coverage.  She states that she has a phone call set up with her endocrinologist tomorrow Eilene Ghazi) to discuss insulin regimen after delivery.    **If patient delivers prior to her call with MD tomorrow, consider Semglee 50 units 1-2 hours prior to d/c of insulin drip and Novolog resistant q 4 hours.  She will also require Novolog meal coverage for meals-  Consider starting with Novolog 12 units tid with meals (hold if patient eats less than 50% or NPO).    Will follow.   Thanks,  Adah Perl, RN, BC-ADM Inpatient Diabetes Coordinator Pager [phone]  (8a-5p)

## 2022-05-08 NOTE — Progress Notes (Signed)
Dr Dorena Cookey at bedside with ultrasound,Vertex presentation confirmed

## 2022-05-08 NOTE — Progress Notes (Signed)
Patient is eating food,Plan to start insulin drip after finish eating .

## 2022-05-09 ENCOUNTER — Encounter (HOSPITAL_COMMUNITY): Payer: Self-pay | Admitting: Family Medicine

## 2022-05-09 ENCOUNTER — Encounter (HOSPITAL_COMMUNITY)
Admission: RE | Disposition: A | Discharge status: Home / Self Care | Payer: Self-pay | Source: Home / Self Care | Attending: Family Medicine

## 2022-05-09 ENCOUNTER — Telehealth (INDEPENDENT_AMBULATORY_CARE_PROVIDER_SITE_OTHER): Payer: Self-pay | Admitting: Family

## 2022-05-09 ENCOUNTER — Other Ambulatory Visit: Payer: Self-pay

## 2022-05-09 DIAGNOSIS — O9832 Other infections with a predominantly sexual mode of transmission complicating childbirth: Secondary | ICD-10-CM

## 2022-05-09 DIAGNOSIS — O99344 Other mental disorders complicating childbirth: Secondary | ICD-10-CM | POA: Diagnosis not present

## 2022-05-09 DIAGNOSIS — O99214 Obesity complicating childbirth: Secondary | ICD-10-CM

## 2022-05-09 DIAGNOSIS — Z3A37 37 weeks gestation of pregnancy: Secondary | ICD-10-CM

## 2022-05-09 DIAGNOSIS — O24424 Gestational diabetes mellitus in childbirth, insulin controlled: Secondary | ICD-10-CM | POA: Diagnosis not present

## 2022-05-09 DIAGNOSIS — O3663X0 Maternal care for excessive fetal growth, third trimester, not applicable or unspecified: Secondary | ICD-10-CM | POA: Diagnosis not present

## 2022-05-09 DIAGNOSIS — O99820 Streptococcus B carrier state complicating pregnancy: Secondary | ICD-10-CM | POA: Diagnosis not present

## 2022-05-09 DIAGNOSIS — O24429 Gestational diabetes mellitus in childbirth, unspecified control: Secondary | ICD-10-CM

## 2022-05-09 LAB — GLUCOSE, CAPILLARY
Glucose-Capillary: 107 mg/dL — ABNORMAL HIGH (ref 70–99)
Glucose-Capillary: 112 mg/dL — ABNORMAL HIGH (ref 70–99)
Glucose-Capillary: 118 mg/dL — ABNORMAL HIGH (ref 70–99)
Glucose-Capillary: 69 mg/dL — ABNORMAL LOW (ref 70–99)
Glucose-Capillary: 80 mg/dL (ref 70–99)
Glucose-Capillary: 89 mg/dL (ref 70–99)
Glucose-Capillary: 97 mg/dL (ref 70–99)

## 2022-05-09 LAB — HCV INTERPRETATION

## 2022-05-09 LAB — BASIC METABOLIC PANEL
Anion gap: 10 (ref 5–15)
BUN: 6 mg/dL (ref 6–20)
CO2: 20 mmol/L — ABNORMAL LOW (ref 22–32)
Calcium: 8.2 mg/dL — ABNORMAL LOW (ref 8.9–10.3)
Chloride: 109 mmol/L (ref 98–111)
Creatinine, Ser: 0.73 mg/dL (ref 0.44–1.00)
GFR, Estimated: >60 mL/min (ref >60)
Glucose, Bld: 89 mg/dL (ref 70–99)
Potassium: 3.2 mmol/L — ABNORMAL LOW (ref 3.5–5.1)
Sodium: 139 mmol/L (ref 135–145)

## 2022-05-09 LAB — HCV AB W REFLEX TO QUANT PCR: HCV Ab: NONREACTIVE

## 2022-05-09 SURGERY — Surgical Case
Anesthesia: Epidural | Site: Abdomen

## 2022-05-09 MED ORDER — LIDOCAINE-EPINEPHRINE (PF) 2 %-1:200000 IJ SOLN
INTRAMUSCULAR | Status: AC
  Filled 2022-05-09: qty 20

## 2022-05-09 MED ORDER — MEASLES, MUMPS & RUBELLA VAC IJ SOLR: 0.5000 mL | Freq: Once | INTRAMUSCULAR | Status: DC

## 2022-05-09 MED ORDER — DIPHENHYDRAMINE HCL 25 MG PO CAPS: 25.0000 mg | ORAL_CAPSULE | ORAL | Status: DC | PRN

## 2022-05-09 MED ORDER — SODIUM CHLORIDE 0.9 % IR SOLN
Status: DC | PRN
  Administered 2022-05-09: 1

## 2022-05-09 MED ORDER — SENNOSIDES-DOCUSATE SODIUM 8.6-50 MG PO TABS
2.0000 | ORAL_TABLET | ORAL | Status: DC
  Administered 2022-05-10 – 2022-05-13 (×4): 2 via ORAL
  Filled 2022-05-09 (×4): qty 2

## 2022-05-09 MED ORDER — LIDOCAINE-EPINEPHRINE (PF) 2 %-1:200000 IJ SOLN
INTRAMUSCULAR | Status: DC | PRN
  Administered 2022-05-09 (×3): 5 mL via EPIDURAL

## 2022-05-09 MED ORDER — DIPHENHYDRAMINE HCL 50 MG/ML IJ SOLN: 12.5000 mg | INTRAMUSCULAR | Status: DC | PRN

## 2022-05-09 MED ORDER — NICOTINE 14 MG/24HR TD PT24
14.0000 mg | MEDICATED_PATCH | Freq: Every day | TRANSDERMAL | Status: DC
  Administered 2022-05-09: 14 mg via TRANSDERMAL
  Filled 2022-05-09: qty 1

## 2022-05-09 MED ORDER — NALOXONE HCL 0.4 MG/ML IJ SOLN: 0.4000 mg | INTRAMUSCULAR | Status: DC | PRN

## 2022-05-09 MED ORDER — MORPHINE SULFATE (PF) 0.5 MG/ML IJ SOLN
INTRAMUSCULAR | Status: DC | PRN
  Administered 2022-05-09: 3 mg via EPIDURAL

## 2022-05-09 MED ORDER — VALACYCLOVIR HCL 500 MG PO TABS
500.0000 mg | ORAL_TABLET | Freq: Two times a day (BID) | ORAL | Status: DC
  Administered 2022-05-09 – 2022-05-13 (×9): 500 mg via ORAL
  Filled 2022-05-09 (×9): qty 1

## 2022-05-09 MED ORDER — OXYTOCIN-SODIUM CHLORIDE 30-0.9 UT/500ML-% IV SOLN
INTRAVENOUS | Status: DC | PRN
  Administered 2022-05-09: 300 mL via INTRAVENOUS

## 2022-05-09 MED ORDER — SCOPOLAMINE 1 MG/3DAYS TD PT72
1.0000 | MEDICATED_PATCH | TRANSDERMAL | Status: DC
  Administered 2022-05-09: 1.5 mg via TRANSDERMAL
  Filled 2022-05-09: qty 1

## 2022-05-09 MED ORDER — FENTANYL CITRATE (PF) 100 MCG/2ML IJ SOLN
INTRAMUSCULAR | Status: AC
  Filled 2022-05-09: qty 2

## 2022-05-09 MED ORDER — PHENYLEPHRINE HCL-NACL 20-0.9 MG/250ML-% IV SOLN
INTRAVENOUS | Status: DC | PRN
  Administered 2022-05-09: 60 ug/min via INTRAVENOUS

## 2022-05-09 MED ORDER — OXYCODONE HCL 5 MG PO TABS
5.0000 mg | ORAL_TABLET | ORAL | Status: DC | PRN
  Administered 2022-05-10 (×4): 5 mg via ORAL
  Filled 2022-05-09 (×4): qty 1

## 2022-05-09 MED ORDER — ONDANSETRON HCL 4 MG/2ML IJ SOLN
4.0000 mg | Freq: Once | INTRAMUSCULAR | Status: AC
  Administered 2022-05-09: 4 mg via INTRAVENOUS

## 2022-05-09 MED ORDER — CEFAZOLIN IN SODIUM CHLORIDE 3-0.9 GM/100ML-% IV SOLN
3.0000 g | INTRAVENOUS | Status: AC
  Administered 2022-05-09: 3 g via INTRAVENOUS

## 2022-05-09 MED ORDER — ACETAMINOPHEN 10 MG/ML IV SOLN
INTRAVENOUS | Status: DC | PRN
  Administered 2022-05-09: 1000 mg via INTRAVENOUS

## 2022-05-09 MED ORDER — FENTANYL CITRATE (PF) 100 MCG/2ML IJ SOLN: 25.0000 ug | INTRAMUSCULAR | Status: DC | PRN

## 2022-05-09 MED ORDER — INSULIN ASPART 100 UNIT/ML IJ SOLN: 0.0000 [IU] | INTRAMUSCULAR | Status: DC

## 2022-05-09 MED ORDER — LACTATED RINGERS IV SOLN: INTRAVENOUS | Status: DC

## 2022-05-09 MED ORDER — OXYTOCIN-SODIUM CHLORIDE 30-0.9 UT/500ML-% IV SOLN: 2.5000 [IU]/h | INTRAVENOUS | Status: DC

## 2022-05-09 MED ORDER — DEXAMETHASONE SODIUM PHOSPHATE 4 MG/ML IJ SOLN
INTRAMUSCULAR | Status: DC | PRN
  Administered 2022-05-09 (×2): 8 mg via INTRAVENOUS

## 2022-05-09 MED ORDER — SODIUM CHLORIDE 0.9 % IV SOLN
INTRAVENOUS | Status: AC
  Filled 2022-05-09: qty 5

## 2022-05-09 MED ORDER — SERTRALINE HCL 50 MG PO TABS
200.0000 mg | ORAL_TABLET | Freq: Every day | ORAL | Status: DC
  Administered 2022-05-09: 200 mg via ORAL
  Filled 2022-05-09: qty 2

## 2022-05-09 MED ORDER — TRANEXAMIC ACID-NACL 1000-0.7 MG/100ML-% IV SOLN
1000.0000 mg | INTRAVENOUS | Status: DC
  Filled 2022-05-09: qty 100

## 2022-05-09 MED ORDER — ACETAMINOPHEN 10 MG/ML IV SOLN
INTRAVENOUS | Status: AC
  Filled 2022-05-09: qty 100

## 2022-05-09 MED ORDER — BUPROPION HCL ER (XL) 300 MG PO TB24
300.0000 mg | ORAL_TABLET | Freq: Every day | ORAL | Status: DC
  Administered 2022-05-09: 300 mg via ORAL
  Filled 2022-05-09: qty 1

## 2022-05-09 MED ORDER — INSULIN GLARGINE-YFGN 100 UNIT/ML ~~LOC~~ SOLN
50.0000 [IU] | Freq: Every day | SUBCUTANEOUS | Status: DC
  Administered 2022-05-09: 50 [IU] via SUBCUTANEOUS
  Filled 2022-05-09 (×2): qty 0.5

## 2022-05-09 MED ORDER — BUPROPION HCL ER (XL) 150 MG PO TB24
150.0000 mg | ORAL_TABLET | Freq: Every day | ORAL | Status: DC
  Administered 2022-05-09: 150 mg via ORAL
  Filled 2022-05-09: qty 1

## 2022-05-09 MED ORDER — SODIUM CHLORIDE 0.9 % IV SOLN
500.0000 mg | INTRAVENOUS | Status: AC
  Administered 2022-05-09: 500 mg via INTRAVENOUS

## 2022-05-09 MED ORDER — WITCH HAZEL-GLYCERIN EX PADS: 1.0000 | MEDICATED_PAD | CUTANEOUS | Status: DC | PRN

## 2022-05-09 MED ORDER — MENTHOL 3 MG MT LOZG: 1.0000 | LOZENGE | OROMUCOSAL | Status: DC | PRN

## 2022-05-09 MED ORDER — PHENYLEPHRINE HCL (PRESSORS) 10 MG/ML IV SOLN
INTRAVENOUS | Status: DC | PRN
  Administered 2022-05-09 (×3): 80 ug via INTRAVENOUS
  Administered 2022-05-09: 40 ug via INTRAVENOUS
  Administered 2022-05-09: 80 ug via INTRAVENOUS

## 2022-05-09 MED ORDER — STERILE WATER FOR IRRIGATION IR SOLN
Status: DC | PRN
  Administered 2022-05-09: 1000 mL

## 2022-05-09 MED ORDER — ONDANSETRON HCL 4 MG/2ML IJ SOLN
4.0000 mg | Freq: Three times a day (TID) | INTRAMUSCULAR | Status: DC | PRN
  Administered 2022-05-09: 4 mg via INTRAVENOUS
  Filled 2022-05-09: qty 2

## 2022-05-09 MED ORDER — NALBUPHINE HCL 10 MG/ML IJ SOLN
5.0000 mg | Freq: Once | INTRAMUSCULAR | Status: AC
  Administered 2022-05-09: 5 mg via INTRAVENOUS
  Filled 2022-05-09: qty 1

## 2022-05-09 MED ORDER — NALOXONE HCL 4 MG/10ML IJ SOLN: 1.0000 ug/kg/h | INTRAVENOUS | Status: DC | PRN

## 2022-05-09 MED ORDER — ACETAMINOPHEN 500 MG PO TABS
1000.0000 mg | ORAL_TABLET | Freq: Four times a day (QID) | ORAL | Status: DC
  Administered 2022-05-09 – 2022-05-13 (×14): 1000 mg via ORAL
  Filled 2022-05-09 (×15): qty 2

## 2022-05-09 MED ORDER — SOD CITRATE-CITRIC ACID 500-334 MG/5ML PO SOLN
30.0000 mL | Freq: Once | ORAL | Status: AC
  Administered 2022-05-09: 30 mL via ORAL

## 2022-05-09 MED ORDER — DIBUCAINE (PERIANAL) 1 % EX OINT: 1.0000 | TOPICAL_OINTMENT | CUTANEOUS | Status: DC | PRN

## 2022-05-09 MED ORDER — CHLOROPROCAINE HCL (PF) 3 % IJ SOLN
INTRAMUSCULAR | Status: AC
  Filled 2022-05-09: qty 20

## 2022-05-09 MED ORDER — FENTANYL CITRATE (PF) 100 MCG/2ML IJ SOLN
INTRAMUSCULAR | Status: DC | PRN
  Administered 2022-05-09: 100 ug via EPIDURAL

## 2022-05-09 MED ORDER — TRANEXAMIC ACID-NACL 1000-0.7 MG/100ML-% IV SOLN
INTRAVENOUS | Status: DC | PRN
  Administered 2022-05-09: 1000 mg via INTRAVENOUS

## 2022-05-09 MED ORDER — ENOXAPARIN SODIUM 80 MG/0.8ML IJ SOSY
80.0000 mg | PREFILLED_SYRINGE | INTRAMUSCULAR | Status: DC
  Administered 2022-05-10 – 2022-05-13 (×4): 80 mg via SUBCUTANEOUS
  Filled 2022-05-09 (×4): qty 0.8

## 2022-05-09 MED ORDER — MEPERIDINE HCL 25 MG/ML IJ SOLN: 6.2500 mg | INTRAMUSCULAR | Status: DC | PRN

## 2022-05-09 MED ORDER — SCOPOLAMINE 1 MG/3DAYS TD PT72
MEDICATED_PATCH | TRANSDERMAL | Status: DC | PRN
  Administered 2022-05-09: 1 via TRANSDERMAL

## 2022-05-09 MED ORDER — ZOLPIDEM TARTRATE 5 MG PO TABS: 5.0000 mg | ORAL_TABLET | Freq: Every evening | ORAL | Status: DC | PRN

## 2022-05-09 MED ORDER — SIMETHICONE 80 MG PO CHEW: 80.0000 mg | CHEWABLE_TABLET | ORAL | Status: DC | PRN

## 2022-05-09 MED ORDER — CEFAZOLIN IN SODIUM CHLORIDE 3-0.9 GM/100ML-% IV SOLN
INTRAVENOUS | Status: AC
  Filled 2022-05-09: qty 100

## 2022-05-09 MED ORDER — SIMETHICONE 80 MG PO CHEW
80.0000 mg | CHEWABLE_TABLET | Freq: Three times a day (TID) | ORAL | Status: DC
  Administered 2022-05-10 – 2022-05-13 (×10): 80 mg via ORAL
  Filled 2022-05-09 (×10): qty 1

## 2022-05-09 MED ORDER — SOD CITRATE-CITRIC ACID 500-334 MG/5ML PO SOLN: 30.0000 mL | ORAL | Status: DC

## 2022-05-09 MED ORDER — SODIUM CHLORIDE 0.9% FLUSH: 3.0000 mL | INTRAVENOUS | Status: DC | PRN

## 2022-05-09 MED ORDER — ONDANSETRON HCL 4 MG/2ML IJ SOLN
INTRAMUSCULAR | Status: AC
  Filled 2022-05-09: qty 2

## 2022-05-09 MED ORDER — DIPHENHYDRAMINE HCL 25 MG PO CAPS: 25.0000 mg | ORAL_CAPSULE | Freq: Four times a day (QID) | ORAL | Status: DC | PRN

## 2022-05-09 MED ORDER — MORPHINE SULFATE (PF) 0.5 MG/ML IJ SOLN
INTRAMUSCULAR | Status: AC
  Filled 2022-05-09: qty 10

## 2022-05-09 MED ORDER — FENTANYL CITRATE (PF) 100 MCG/2ML IJ SOLN
INTRAMUSCULAR | Status: DC | PRN
  Administered 2022-05-09: 50 ug via INTRAVENOUS

## 2022-05-09 MED ORDER — ONDANSETRON HCL 4 MG/2ML IJ SOLN: INTRAMUSCULAR | Status: DC | PRN

## 2022-05-09 MED ORDER — DEXMEDETOMIDINE HCL IN NACL 80 MCG/20ML IV SOLN
INTRAVENOUS | Status: AC
  Filled 2022-05-09: qty 20

## 2022-05-09 MED ORDER — FENTANYL CITRATE (PF) 100 MCG/2ML IJ SOLN: INTRAMUSCULAR | Status: DC | PRN

## 2022-05-09 MED ORDER — COCONUT OIL OIL: 1.0000 | TOPICAL_OIL | Status: DC | PRN

## 2022-05-09 MED ORDER — PRAZOSIN HCL 2 MG PO CAPS
2.0000 mg | ORAL_CAPSULE | Freq: Every day | ORAL | Status: DC
  Administered 2022-05-09 – 2022-05-12 (×4): 2 mg via ORAL
  Filled 2022-05-09 (×5): qty 1

## 2022-05-09 SURGICAL SUPPLY — 39 items
APL PRP STRL LF DISP 70% ISPRP (MISCELLANEOUS) ×2
APL SKNCLS STERI-STRIP NONHPOA (GAUZE/BANDAGES/DRESSINGS) ×1
BENZOIN TINCTURE PRP APPL 2/3 (GAUZE/BANDAGES/DRESSINGS) ×1 IMPLANT
CANISTER WOUND CARE 500ML ATS (WOUND CARE) IMPLANT
CHLORAPREP W/TINT 26 (MISCELLANEOUS) ×2 IMPLANT
CLAMP UMBILICAL CORD (MISCELLANEOUS) ×1 IMPLANT
CLOTH BEACON ORANGE TIMEOUT ST (SAFETY) ×1 IMPLANT
DRESSING PREVENA PLUS CUSTOM (GAUZE/BANDAGES/DRESSINGS) IMPLANT
DRSG OPSITE POSTOP 4X10 (GAUZE/BANDAGES/DRESSINGS) ×1 IMPLANT
DRSG PREVENA PLUS CUSTOM (GAUZE/BANDAGES/DRESSINGS) ×1
ELECT REM PT RETURN 9FT ADLT (ELECTROSURGICAL) ×1
ELECTRODE REM PT RTRN 9FT ADLT (ELECTROSURGICAL) ×1 IMPLANT
EXCISOR BIOPSY CONE FISHER (MISCELLANEOUS) IMPLANT
EXTRACTOR VACUUM M CUP 4 TUBE (SUCTIONS) IMPLANT
GAUZE SPONGE 4X4 12PLY STRL LF (GAUZE/BANDAGES/DRESSINGS) IMPLANT
GLOVE BIOGEL PI IND STRL 7.0 (GLOVE) ×2 IMPLANT
GLOVE BIOGEL PI IND STRL 7.5 (GLOVE) ×2 IMPLANT
GLOVE ECLIPSE 7.5 STRL STRAW (GLOVE) ×1 IMPLANT
GOWN STRL REUS W/TWL LRG LVL3 (GOWN DISPOSABLE) ×3 IMPLANT
KIT ABG SYR 3ML LUER SLIP (SYRINGE) IMPLANT
MAT PREVALON FULL STRYKER (MISCELLANEOUS) IMPLANT
NDL HYPO 25X5/8 SAFETYGLIDE (NEEDLE) IMPLANT
NEEDLE HYPO 25X5/8 SAFETYGLIDE (NEEDLE) IMPLANT
NS IRRIG 1000ML POUR BTL (IV SOLUTION) ×1 IMPLANT
PACK C SECTION WH (CUSTOM PROCEDURE TRAY) ×1 IMPLANT
PAD OB MATERNITY 4.3X12.25 (PERSONAL CARE ITEMS) ×1 IMPLANT
RETRACTOR TRAXI PANNICULUS (MISCELLANEOUS) IMPLANT
RTRCTR C-SECT PINK 25CM LRG (MISCELLANEOUS) ×1 IMPLANT
STRIP CLOSURE SKIN 1/2X4 (GAUZE/BANDAGES/DRESSINGS) ×1 IMPLANT
SUT PLAIN 0 NONE (SUTURE) ×1 IMPLANT
SUT VIC AB 0 CT1 36 (SUTURE) ×3 IMPLANT
SUT VIC AB 0 CTX 36 (SUTURE) ×1
SUT VIC AB 0 CTX36XBRD ANBCTRL (SUTURE) ×1 IMPLANT
SUT VIC AB 2-0 CT1 27 (SUTURE) ×1
SUT VIC AB 2-0 CT1 TAPERPNT 27 (SUTURE) ×1 IMPLANT
SUT VIC AB 4-0 KS 27 (SUTURE) ×1 IMPLANT
TOWEL OR 17X24 6PK STRL BLUE (TOWEL DISPOSABLE) ×1 IMPLANT
TRAY FOLEY W/BAG SLVR 14FR LF (SET/KITS/TRAYS/PACK) ×1 IMPLANT
WATER STERILE IRR 1000ML POUR (IV SOLUTION) ×1 IMPLANT

## 2022-05-09 NOTE — Progress Notes (Signed)
Labor Progress Note Lydia Estrada is a 22 y.o. G2P0010 at 60w1dpresented for IOL for T1DM.  S: Pt feels discomfort from external monitoring straps, but would prefer to continue this than to place other additional devices.   O:  BP 119/66   Pulse 71   Temp 98.5 F (36.9 C) (Axillary)   Resp 18   Ht '5\' 8"'$  (1.727 m)   Wt (!) 154.2 kg   LMP 06/21/2021   SpO2 99%   BMI 51.70 kg/m  EFM: 120/6-25/none  CVE: Dilation: 6 Effacement (%): 80, 90 Cervical Position: Posterior Station: -1, -2 (caput noted) Presentation: Vertex Exam by:: SAron BabaRNC   A&P: 22y.o. G2P0010 339w1dOL for T1DM.  #Labor: Decreased pit rate.  #Pain: Epidural #FWB: Cat 1  #GBS positive Giving IV PCN  T1DM Glucose remains low despite D5, has not needed insulin. Asymptomatic at this time. Low concern for ketosis.  - Diabetes coordinator consult - Pt planning to have phone call with her Endocrinologist ~2:30pm.   Tobacco Use Disorder - Reports vaping. Previously used '14mg'$  nicotine patches. This was ordered.  JaArlyce DiceMD [facility]:08 AM

## 2022-05-09 NOTE — Progress Notes (Signed)
Inpatient Diabetes Program Recommendations  AACE/ADA: New Consensus Statement on Inpatient Glycemic Control (2015)  Target Ranges:  Prepandial:   less than 140 mg/dL      Peak postprandial:   less than 180 mg/dL (1-2 hours)      Critically ill patients:  140 - 180 mg/dL   Lab Results  Component Value Date   GLUCAP 97 05/09/2022   HGBA1C 7.5 (H) 03/14/2022    Review of Glycemic Control Glucose=116 mg/dL Diabetes history: Type 1 Prior to Pregnancy: Tresiba 108 units daily CHO ratio was 1 unit for every 4 grams of CHO Pregnancy:  Tresiba 60 units bid Humalog 30-40 units tid with meals (CHO ratio 1 unit:1 gram CHO) Current orders for Inpatient glycemic control:  IV insulin (goal 90-120 mg/dL)  Inpatient Diabetes Program Recommendations:    Call received from Dr. Si Raider.  Patient is having c-section.   Note history of Type 1 DM.  Recommend Semglee 50 units ASAP after delivery, Novolog resistant (0-20 units) correction q 4 hours, and once eating Novolog 12 units tid with meals (hold if patient eats less than 50% or NPO).   Thanks,  Adah Perl, RN, BC-ADM Inpatient Diabetes Coordinator Pager [phone]  (8a-5p)

## 2022-05-09 NOTE — Progress Notes (Signed)
Patient ID: Lydia Estrada, female   DOB: Sep 01, 2000, 22 y.o.   MRN: [id number]  CNM returned to bedside for evaluation of recurrent decels and poor toco accuracy. Patient now agreeable to having internal monitors placed. Placement tolerated well by patient.  Mallie Snooks, MSA, MSN, CNM Certified Nurse Midwife, [facility] 05/09/22 10:00 AM

## 2022-05-09 NOTE — Op Note (Signed)
Cesarean Section Operative Report  Lydia Estrada  05/08/2022 - 05/09/2022  Indications: fetal indications (persistent category 2 tracing, bradycardia)  Pre-operative Diagnosis: primary repeat cesarean section  Post-operative Diagnosis: same  Surgeon: Surgeon(s) and Role:    * Modesto Ganoe, Ailene Rud, MD - Primary    * Griffin Basil, MD - Assisting   Attending Attestation: I was present and scrubbed for the entire procedure.   An experienced assistant was required given the standard of surgical care given the complexity of the case.  This assistant was needed for exposure, dissection, suctioning, retraction, instrument exchange, assisting with delivery with administration of fundal pressure, and for overall help during the procedure.  Anesthesia: epidural    Quantified Blood Loss: 712 ml  Total IV Fluids: 900 ml LR  Urine Output:: 50 ml dark yellow urine  Specimens: placenta to pathology  Findings: Viable female infant in cephalic presentation; Apgars pending; weight pending; arterial cord pH 7.16;  clear amniotic fluid; intact placenta with three vessel cord; normal uterus, fallopian tubes and ovaries bilaterally.  Baby condition / location:  Couplet care / Skin to Skin   Complications: no complications  Indications: Lydia Estrada is a 22 y.o. G2P1011 with an IUP 26w1dpresenting for IOL for T1DM. See prior progress note for detailed discussion. In brief, decision made to proceed to cesarean for fetal indications (persistent category 2 tracing remote from delivery). Persistent category 2 tracing resolved prior to surgery with cessation of pitocin. However, after induction of anesthesia, prior to vaginal and abdominal prep, fetal heart tones via FSE dropped from 120s to 50s and did not recover with repositioning. At about the 3 minute mark, with heart tones still in the 50s, decision made to convert to emergency cesarean. .  Procedure Details:  A time out was held and the above  information confirmed.   Iodine was splashed on the patient's abdomen and the patient was draped. Anesthesia found to be adequate. A Pfannenstiel incision was made and carried down through the subcutaneous tissue to the fascia. Fascial incision was made and bluntly extended transversely. The fascia was separated from the underlying rectus tissue superiorly and inferiorly. The peritoneum was identified and bluntly entered and extended longitudinally. Alexis retractor was placed. A bladder flap was not created. A low transverse uterine incision was made and extended bluntly. Delivered from cephalic presentation with some difficulty due to baby's size and advanced station was a viable infant with Apgars and weight as above.  After waiting 30 seconds for delayed cord cutting, given relatively poor respiratory effort, the umbilical cord was clamped and cut cord blood was obtained for evaluation. Cord ph was sent. The placenta was removed Intact and appeared normal. The uterine incision was closed with running unlocked sutures 0-Vicryl in one layer.   Hemostasis was observed after placement of one figure of eight 0 vicryl stitch. The peritoneum was closed with 2-0 vicryl. The rectus muscles were examined and hemostasis observed. The fascia was then reapproximated with running sutures of 0-Vicryl. The subcuticular closure was closed with 2-0 plain gut. The skin was closed with 4-0 Vicryl. Prevena wound vac was applied.  Instrument, sponge, and needle counts were correct prior the abdominal closure and were correct at the conclusion of the case.     Disposition: PACU - hemodynamically stable.   Maternal Condition: stable       Signed: NPatsy LagerWoukMD 05/09/2022 2:22 PM

## 2022-05-09 NOTE — Transfer of Care (Signed)
Immediate Anesthesia Transfer of Care Note  Patient: Lydia Estrada  Procedure(s) Performed: CESAREAN SECTION (Abdomen)  Patient Location: PACU  Anesthesia Type:Epidural  Level of Consciousness: awake, alert , and oriented  Airway & Oxygen Therapy: Patient Spontanous Breathing  Post-op Assessment: Report given to RN and Post -op Vital signs reviewed and stable  Post vital signs: Reviewed and stable  Last Vitals:  Vitals Value Taken Time  BP 115/59 05/09/22 1449  Temp    Pulse 73   Resp 16 05/09/22 1453  SpO2 100   Vitals shown include unvalidated device data.  Last Pain:  Vitals:   05/09/22 1307  TempSrc:   PainSc: 0-No pain         Complications: No notable events documented.

## 2022-05-09 NOTE — Progress Notes (Addendum)
Patient Vitals for the past 4 hrs:  BP Pulse  05/09/22 0431 (!) 143/71 78  05/09/22 0401 (!) 145/69 69  05/09/22 0331 132/82 64  05/09/22 0301 (!) 140/90 64  05/09/22 0231 (!) 124/53 65  05/09/22 0201 123/61 68  05/09/22 0131 (!) 116/50 (!) 59  05/09/22 0101 (!) 108/48 60   Still comfortable, feels pressure some times.  Cx 6/100/-2.  AROM w/clear fluid.  Ctx q 2-3 minutes, pitocin at 6 mu/min.FHR 120, moderate variability, + accels, no decels.  Cat 1.  Glucosse 76-93. BP readings increased over the last hour, will continue to monitor. Continue present mgt.

## 2022-05-09 NOTE — Progress Notes (Addendum)
I went to bedside to see the patient. 22 yo g1 @ [id number] pregnancy complicated by [id number], morbid obesity, hxv on suppression therapy no lesions. IOL started about 36 hours ago. Has been on pit since yesterday afternoon at about 15:30. AROM this morning around 4, clear fluid. 6 cm dilated, has fse and iupc, contractions not currently adequate. Currently on pitocin. She has a persistent category 2 tracing for subtle but recurrent late decels. Moderate variability, no accels. We discussed in depths risks/benefits of cesarean including risks for mother and fetus, vs expectant mgmt. I shared given latent labor and persistent category 2 tracing, proceeding to cesarean is a reasonable option and that there is some risk if we pursue expectant mgmt. I did advise a pit break if we are not proceeding with cesarean and patient agrees to that. She and family have discussed and she does not want to proceed with cesarean at this time, wants pit break of 1-2 hours, will make decision then about whether to proceed. Tracing has improved and we will obviously monitor, terb as needed, patient aware that if tracing worsens I will advise proceeding with surgery.    Update 12:30 Patient requests proceeding with cesarean - proceeding with primary cesarean section for fetal indications (recurrent late decelerations remote from delivery), maternal request. Decel with vomiting now resolved, category 1 tracing currently.  The risks of cesarean section were discussed with the patient including but were not limited to: bleeding which may require transfusion or reoperation; infection which may require antibiotics; injury to bowel, bladder, ureters or other surrounding organs; injury to the fetus; need for additional procedures including hysterectomy in the event of a life-threatening hemorrhage; placental abnormalities wth subsequent pregnancies, incisional problems, thromboembolic phenomenon and other postoperative/anesthesia complications.  Patient declines tubal sterilization. Patient will remain NPO for procedure. Anesthesia and OR aware.  Preoperative prophylactic antibiotics and SCDs ordered on call to the OR.  To OR when ready.

## 2022-05-09 NOTE — Lactation Note (Signed)
This note was copied from a baby's chart. Lactation Consultation Note  Patient Name: Lydia Estrada [id number] Date: 05/09/2022 Age:23 hours Reason for consult: Initial assessment;Primapara;Early term 37-38.6wks;Maternal endocrine disorder Mom had called earlier for BF assistance but mom isn't feeling well. Mom stated she has been nauseated and throwing up for several days and now she is in pain and feeling bad. Baby had a low glucose and only took 2 ml formula. LC got baby to take a total of 20 ml 22 cal. Similac. LC will visit w/mom when she is feeling better. Mom appreciates that. Mom is happy LC got baby to take more formula to get glucose level up. Encouraged to call when feeling better.  Maternal Data Has patient been taught Hand Expression?: No Does the patient have breastfeeding experience prior to this delivery?: No  Feeding Mother's Current Feeding Choice: Breast Milk and Formula Nipple Type: Slow - flow  LATCH Score                    Lactation Tools Discussed/Used    Interventions    Discharge    Consult Status Consult Status: Follow-up Date: 05/10/22 Follow-up type: In-patient    Theodoro Kalata 05/09/2022, 8:24 PM

## 2022-05-09 NOTE — Anesthesia Postprocedure Evaluation (Signed)
Anesthesia Post Note  Patient: Lydia Estrada  Procedure(s) Performed: CESAREAN SECTION (Abdomen)     Patient location during evaluation: PACU Anesthesia Type: Epidural Level of consciousness: oriented and awake and alert Pain management: pain level controlled Vital Signs Assessment: post-procedure vital signs reviewed and stable Respiratory status: spontaneous breathing, respiratory function stable and nonlabored ventilation Cardiovascular status: blood pressure returned to baseline and stable Postop Assessment: no headache, no backache, no apparent nausea or vomiting, epidural receding and patient able to bend at knees Anesthetic complications: no   No notable events documented.  Last Vitals:  Vitals:   05/09/22 1556 05/09/22 1635  BP: 121/62 112/64  Pulse: 75 76  Resp: (!) 21   Temp: 36.6 C 36.6 C  SpO2: 96% 97%    Last Pain:  Vitals:   05/09/22 1635  TempSrc: Oral  PainSc:    Pain Goal:                   Madeline Pho A.

## 2022-05-09 NOTE — Progress Notes (Signed)
Patient Vitals for the past 4 hrs:  BP Pulse Resp  05/08/22 2332 (!) 116/55 62 --  05/08/22 2304 136/80 71 --  05/08/22 2231 127/65 67 --  05/08/22 2201 134/81 62 --  05/08/22 2131 127/89 68 18  05/08/22 2101 (!) 119/97 81 --   Comfortable w/epidural.  Blood sugar 108.  FHR Cat 1.  Ctx q 2 minutes.  Balloon out.  Cx 5.5/70/-3.  Pitocin at 6 mu/min.  Continue present mgt.

## 2022-05-09 NOTE — Telephone Encounter (Signed)
error 

## 2022-05-10 ENCOUNTER — Encounter (HOSPITAL_COMMUNITY): Payer: Self-pay | Admitting: Family Medicine

## 2022-05-10 DIAGNOSIS — O99815 Abnormal glucose complicating the puerperium: Secondary | ICD-10-CM | POA: Diagnosis not present

## 2022-05-10 DIAGNOSIS — D62 Acute posthemorrhagic anemia: Secondary | ICD-10-CM | POA: Diagnosis present

## 2022-05-10 DIAGNOSIS — Z98891 History of uterine scar from previous surgery: Secondary | ICD-10-CM

## 2022-05-10 DIAGNOSIS — Z3A37 37 weeks gestation of pregnancy: Secondary | ICD-10-CM | POA: Diagnosis not present

## 2022-05-10 LAB — GLUCOSE, CAPILLARY
Glucose-Capillary: 103 mg/dL — ABNORMAL HIGH (ref 70–99)
Glucose-Capillary: 39 mg/dL — CL (ref 70–99)
Glucose-Capillary: 43 mg/dL — CL (ref 70–99)
Glucose-Capillary: 44 mg/dL — CL (ref 70–99)
Glucose-Capillary: 45 mg/dL — ABNORMAL LOW (ref 70–99)
Glucose-Capillary: 51 mg/dL — ABNORMAL LOW (ref 70–99)
Glucose-Capillary: 56 mg/dL — ABNORMAL LOW (ref 70–99)
Glucose-Capillary: 59 mg/dL — ABNORMAL LOW (ref 70–99)
Glucose-Capillary: 69 mg/dL — ABNORMAL LOW (ref 70–99)
Glucose-Capillary: 70 mg/dL (ref 70–99)
Glucose-Capillary: 70 mg/dL (ref 70–99)
Glucose-Capillary: 71 mg/dL (ref 70–99)
Glucose-Capillary: 94 mg/dL (ref 70–99)

## 2022-05-10 LAB — CBC
HCT: 30.4 % — ABNORMAL LOW (ref 36.0–46.0)
Hemoglobin: 9.8 g/dL — ABNORMAL LOW (ref 12.0–15.0)
MCH: 25.6 pg — ABNORMAL LOW (ref 26.0–34.0)
MCHC: 32.2 g/dL (ref 30.0–36.0)
MCV: 79.4 fL — ABNORMAL LOW (ref 80.0–100.0)
Platelets: 185 10*3/uL (ref 150–400)
RBC: 3.83 MIL/uL — ABNORMAL LOW (ref 3.87–5.11)
RDW: 13.4 % (ref 11.5–15.5)
WBC: 11.3 10*3/uL — ABNORMAL HIGH (ref 4.0–10.5)
nRBC: 0 % (ref 0.0–0.2)

## 2022-05-10 MED ORDER — ONDANSETRON 4 MG PO TBDP
4.0000 mg | ORAL_TABLET | Freq: Three times a day (TID) | ORAL | Status: DC | PRN
  Filled 2022-05-10: qty 1

## 2022-05-10 MED ORDER — INSULIN GLARGINE-YFGN 100 UNIT/ML ~~LOC~~ SOLN
40.0000 [IU] | Freq: Every day | SUBCUTANEOUS | Status: DC
  Filled 2022-05-10 (×2): qty 0.4

## 2022-05-10 MED ORDER — OXYCODONE HCL 5 MG PO TABS
5.0000 mg | ORAL_TABLET | ORAL | Status: DC | PRN
  Administered 2022-05-11: 10 mg via ORAL
  Administered 2022-05-12: 5 mg via ORAL
  Filled 2022-05-10: qty 1
  Filled 2022-05-10: qty 2

## 2022-05-10 MED ORDER — INSULIN ASPART 100 UNIT/ML IJ SOLN
8.0000 [IU] | Freq: Three times a day (TID) | INTRAMUSCULAR | Status: DC
  Administered 2022-05-10: 8 [IU] via SUBCUTANEOUS

## 2022-05-10 MED ORDER — INSULIN ASPART 100 UNIT/ML IJ SOLN
12.0000 [IU] | Freq: Three times a day (TID) | INTRAMUSCULAR | Status: DC
  Administered 2022-05-10: 9 [IU] via SUBCUTANEOUS
  Administered 2022-05-10: 12 [IU] via SUBCUTANEOUS

## 2022-05-10 MED ORDER — FERROUS SULFATE 325 (65 FE) MG PO TABS: 325.0000 mg | ORAL_TABLET | ORAL | Status: DC

## 2022-05-10 MED ORDER — IRON SUCROSE 500 MG IVPB - SIMPLE MED
500.0000 mg | Freq: Once | INTRAVENOUS | Status: DC
  Filled 2022-05-10 (×2): qty 275

## 2022-05-10 MED ORDER — POLYSACCHARIDE IRON COMPLEX 150 MG PO CAPS
150.0000 mg | ORAL_CAPSULE | ORAL | Status: DC
  Administered 2022-05-10 – 2022-05-12 (×2): 150 mg via ORAL
  Filled 2022-05-10 (×2): qty 1

## 2022-05-10 NOTE — Progress Notes (Signed)
Postpartum Day 1: Cesarean Delivery  Subjective: Patient reports incisional pain and tolerating PO.  No flatus.  OOB to NICU.  Baby is doing wel as per patient, she is breastfeeding. Moderate lochia.  Objective: Vital signs in last 24 hours: Temp:  [97.9 F (36.6 C)-98.6 F (37 C)] 98.6 F (37 C) (03/09 0503) Pulse Rate:  [60-110] 66 (03/09 0503) Resp:  [13-22] 18 (03/09 0503) BP: (90-156)/(46-117) 105/46 (03/09 0503) SpO2:  [93 %-100 %] 96 % (03/09 0503)  Physical Exam:  General: alert and no distress Lochia: appropriate Uterine Fundus: firm Incision: Prevena in place DVT Evaluation: No evidence of DVT seen on physical exam. Negative Homan's sign.  Recent Labs    05/08/22 0100 05/10/22 0357  HGB 12.0 9.8*  HCT 36.4 30.4*   Assessment/Plan: Status post Cesarean section. Doing well postoperatively.  Continue Semglee 50 units qhs and Novolog 12 tid ac for now, monitor CBGs Counseled about Venofer for acute postoperative anemia due to expected blood loss, asymptomatic. She agreed, this was ordered Oral analgesia as needed Encourage OOB, also on Lovenox for VTE prophylaxis. Breastfeeding. ? Contraception Continue routine postpartum care   Verita Schneiders, MD 05/10/2022, 6:00 AM

## 2022-05-10 NOTE — Progress Notes (Signed)
Hypoglycemic Event  CBG: 43, 59, 51 , 56   Treatment: 4 oz juice/soda and 8 oz juice/soda  Symptoms: Shaky and Vision changes  Follow-up CBG: Time:2110, 2131. 2113. 2237 CBG Result: 70  Possible Reasons for Event: Inadequate meal intake and Medication regimen:    Comments/MD notified: Dr Harolyn Rutherford notified and orders received to hold insulin for now     Soyla Dryer

## 2022-05-10 NOTE — Progress Notes (Signed)
Hypoglycemic Event  CBG: 39  Treatment: 4 oz juice/soda  Symptoms: Shaky  Follow-up CBG: [id number] CBG Result:70  Possible Reasons for Event: Medication regimen:    Comments/MD notified:insulin orders changed  New orders placed   Eusebio Friendly [facility]
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
## 2022-05-10 NOTE — Lactation Note (Addendum)
This note was copied from a baby's chart. Lactation Consultation Note  Patient Name: Girl Natalija Orfanos [id number] Date: 05/10/2022 Age:23 hours Reason for consult: Follow-up assessment (C/S delivery, LGA, TIDM, alot of areola edema in both breast.)  P1, Per Birth Parent she has only been formula feeding infant due to areola edema, and only pumped once, felt pain with pumping. Birth Parent current feeding choice is to "Pump only" and formula feed infant. LC re-fitted Birth Parent with size 27 mm breast flange, Per Birth Parent no pain with pumping, LC explained how to use DEBP, Birth Parent expressed 35 mls during this pumping session and felt no pain with pumping, LC observed that Birth Parent nipples were evert and not longer flat and some of the pitting areola edema was resolved nipples were soften and no longer hard. LC discussed that EBM is safe at room temperature for 4 hours where as formula must be used within 1 hour. Birth Parent plans to offer her own EBM  at infant's next feeding, infant is currently consuming 30 mls per feeding white White Nfant preemie nipple. LC discussed importance of maternal rest, diet and hydration.   Birth Parent current feeding plan: 1- Birth Parent will continue to feed infant 8 to 12+ times within 24 hours, using White Nfant nipple will offer her EBM first and then formula. 2- Birth Parent knows on Day 2 of life to offer infant 15-30 mls per feeding or more if infant wants it, sheet given with intake volumes up to 5 days of life. 3- Birth Parent will continue to use DEBP every 3 hours for 15 minutes on initial setting.  Maternal Data    Feeding Mother's Current Feeding Choice: Breast Milk and Formula Nipple Type: Dr. Clement Husbands  LATCH Score  No latch observed, Birth Parent plans to " pump only " and formula feed infant.                   Lactation Tools Discussed/Used Tools: Pump;Flanges Flange Size: 27 Breast pump type: Double-Electric  Breast Pump Pump Education: Setup, frequency, and cleaning;Milk Storage Reason for Pumping: Birth Parent feeding plan is to pump only and formula feed infant. Pumping frequency: Birth Parent will continue to use DEBP every 3 hours for 15 minutes on inital setting. Pumped volume: 35 mL  Interventions Interventions: DEBP;Education;Pace feeding;Breast massage;Skin to skin  Discharge Pump: DEBP;Personal (Per Birth Parent, she has DEBP at home but does not remember the brand name.)  Consult Status Consult Status: Follow-up Date: 05/11/22 Follow-up type: In-patient    Eulis Canner 05/10/2022, 5:11 PM

## 2022-05-10 NOTE — Progress Notes (Signed)
Date and time results received: 05/10/22 0906 (use smartphrase ".now" to insert current time)  Test: glucose Critical Value: 19  Name of Provider Notified: protocol followed  Orders Received? Or Actions Taken?: protocol followed  Recheck 05/09/25 at 0931 Test: glucose Value: 71

## 2022-05-10 NOTE — Lactation Note (Signed)
This note was copied from a baby's chart. Lactation Consultation Note  Patient Name: Lydia Estrada [id number] Date: 05/10/2022 Age:23 hours Reason for consult: Follow-up assessment;Primapara;Early term 37-38.6wks;Maternal endocrine disorder Mom continues to feel poorly, being slightly nauseated. Mom stated every time she eats she throw it up.  Mom isn't able to BF d/t breast pitting edema. Attempted reverse pressure but edema so thick not very helpful. Suggested mom wear her bra in am after IV is completed.  Shells given to wear in am. Informed mom if they are uncomfortable and hurting don't wear them. Nipples may be to tight.  Mom shown how to use DEBP & how to disassemble, clean, & reassemble parts. Explained to FOB how to work DEBP. Mom is slightly distracted worrying about her baby may need to go to NICU. Suggested pump every 3 hrs and give colostrum to baby via spoon or bottle if collects anything.  Also informed parents milk storage for if baby is rooming w/mom or if has to go to NICU. LC tried to get baby take more formula but the baby wasn't interested. She was alert but only suckled a few times. Had a big gag as if she was going to throw up but didn't. Placed baby STS on mom's chest and covered her. Reported to RN.  Maternal Data Has patient been taught Hand Expression?: Yes Does the patient have breastfeeding experience prior to this delivery?: No  Feeding Mother's Current Feeding Choice: Breast Milk and Formula Nipple Type: Slow - flow  LATCH Score       Type of Nipple: Flat  Comfort (Breast/Nipple): Filling, red/small blisters or bruises, mild/mod discomfort (breast has pitting edema/not compressible)         Lactation Tools Discussed/Used Tools: Pump Breast pump type: Double-Electric Breast Pump Pump Education: Setup, frequency, and cleaning;Milk Storage Reason for Pumping: supplementation Pumping frequency: q3hr  Interventions Interventions: DEBP;Skin to  skin;Breast massage;Expressed milk;Hand express;LC Magazine features editor;Shells;Reverse pressure;Breast compression  Discharge    Consult Status Consult Status: Follow-up Date: 05/10/22 Follow-up type: In-patient    Theodoro Kalata 05/10/2022, 12:46 AM

## 2022-05-11 DIAGNOSIS — O99815 Abnormal glucose complicating the puerperium: Secondary | ICD-10-CM | POA: Diagnosis not present

## 2022-05-11 DIAGNOSIS — Z3A37 37 weeks gestation of pregnancy: Secondary | ICD-10-CM | POA: Diagnosis not present

## 2022-05-11 LAB — GLUCOSE, CAPILLARY
Glucose-Capillary: 47 mg/dL — ABNORMAL LOW (ref 70–99)
Glucose-Capillary: 120 mg/dL — ABNORMAL HIGH (ref 70–99)
Glucose-Capillary: 54 mg/dL — ABNORMAL LOW (ref 70–99)
Glucose-Capillary: 77 mg/dL (ref 70–99)
Glucose-Capillary: 75 mg/dL (ref 70–99)
Glucose-Capillary: 80 mg/dL (ref 70–99)
Glucose-Capillary: 119 mg/dL — ABNORMAL HIGH (ref 70–99)
Glucose-Capillary: 146 mg/dL — ABNORMAL HIGH (ref 70–99)

## 2022-05-11 MED ORDER — GABAPENTIN 100 MG PO CAPS
200.0000 mg | ORAL_CAPSULE | Freq: Two times a day (BID) | ORAL | Status: DC
  Administered 2022-05-11 – 2022-05-13 (×6): 200 mg via ORAL
  Filled 2022-05-11 (×6): qty 2

## 2022-05-11 MED ORDER — DEXTROSE 50 % IV SOLN
INTRAVENOUS | Status: AC
  Filled 2022-05-11: qty 50

## 2022-05-11 MED ORDER — INSULIN GLARGINE-YFGN 100 UNIT/ML ~~LOC~~ SOLN
20.0000 [IU] | Freq: Every day | SUBCUTANEOUS | Status: DC
  Filled 2022-05-11 (×2): qty 0.2

## 2022-05-11 MED ORDER — INSULIN ASPART 100 UNIT/ML IJ SOLN
4.0000 [IU] | Freq: Three times a day (TID) | INTRAMUSCULAR | Status: DC
  Administered 2022-05-11: 4 [IU] via SUBCUTANEOUS

## 2022-05-11 MED ORDER — DEXTROSE 50 % IV SOLN
25.0000 g | INTRAVENOUS | Status: AC
  Administered 2022-05-11: 25 g via INTRAVENOUS

## 2022-05-11 MED ORDER — INSULIN ASPART 100 UNIT/ML IJ SOLN
0.0000 [IU] | INTRAMUSCULAR | Status: DC
  Administered 2022-05-11: 2 [IU] via SUBCUTANEOUS

## 2022-05-11 MED ORDER — HYDROMORPHONE HCL 2 MG PO TABS
2.0000 mg | ORAL_TABLET | ORAL | Status: DC | PRN
  Administered 2022-05-11 (×2): 2 mg via ORAL
  Filled 2022-05-11 (×2): qty 1

## 2022-05-11 NOTE — Progress Notes (Signed)
Postpartum Day 2: Stat Cesarean Delivery for fetal bradycardia at [redacted]w[redacted]d has T1DM  Subjective: Patient reports more incisional pain overnight, not alleviated well with Oxycodone.  She is tolerating regular diet and has a lot of flatus.  OOB to NICU.  Baby is doing well as per patient, she is breastfeeding. Moderate lochia.  Patient continued to have low CBGs, all insulin is held, and patient is not eating.  Objective: Vital signs in last 24 hours: Temp:  [97.7 F (36.5 C)-98.2 F (36.8 C)] 97.9 F (36.6 C) (03/10 0734) Pulse Rate:  [70-84] 72 (03/10 0734) Resp:  [16-18] 18 (03/10 0734) BP: (100-133)/(44-81) 120/52 (03/10 0734) SpO2:  [97 %-99 %] 98 % (03/10 0734)  Physical Exam:  General: alert and no distress Lochia: appropriate Uterine Fundus: firm, NT Incision: Prevena in place DVT Evaluation: No evidence of DVT seen on physical exam. Negative Homan's sign.  Labs:    Latest Ref Rng & Units 05/10/2022    3:57 AM 05/08/2022    1:00 AM 04/12/2022    4:55 AM  CBC  WBC 4.0 - 10.5 K/uL 11.3  9.9  13.6   Hemoglobin 12.0 - 15.0 g/dL 9.8  12.0  12.3   Hematocrit 36.0 - 46.0 % 30.4  36.4  37.5   Platelets 150 - 400 K/uL 185  246  337     Assessment/Plan: POD#2 status post Cesarean section. Doing well postoperatively.  Continue to hold insulin given low blood sugars, patient encouraged to eat more.  Will continue to monitor and follow DM coordinator recommendations. She received Venofer for acute postoperative anemia due to expected blood loss, asymptomatic. She agreed, this was ordered Oral analgesia modified to include standing acetaminophen, Oxycodone prn moderate pain and Dilaudid prn severe pain. Also added Neurontin. Patient is allergic to NSAIDs. Encourage OOB, she is also on Lovenox for VTE prophylaxis. Breastfeeding.  She is unsure about postpartum contraception Continue routine postpartum care.   UVerita Schneiders MD 05/11/2022, 7:43 AM

## 2022-05-11 NOTE — Progress Notes (Signed)
Hypoglycemic Event  CBG: 47  Treatment: D50 50 mL (25 gm)- pt declined taking anything by mouth   Symptoms:  lethargic   Follow-up CBG: [id number] CBG Result:120  Possible Reasons for Event: Inadequate meal intake and Medication regimen:    Comments/MD notified:Dr Anyanwu     Soyla Dryer

## 2022-05-11 NOTE — Progress Notes (Signed)
RN instructed patient on importance of eating to regulate blood glucose levels. Food given to patient.

## 2022-05-11 NOTE — Inpatient Diabetes Management (Signed)
Inpatient Diabetes Program Recommendations  AACE/ADA: New Consensus Statement on Inpatient Glycemic Control (2015)  Target Ranges:  Prepandial:   less than 140 mg/dL      Peak postprandial:   less than 180 mg/dL (1-2 hours)      Critically ill patients:  140 - 180 mg/dL   Lab Results  Component Value Date   GLUCAP 77 05/11/2022   HGBA1C 7.5 (H) 03/14/2022    Review of Glycemic Control  Diabetes history: DM1  Current orders for Inpatient glycemic control: Semglee 40 QHS, Novolog 8 units TID with meals  Hypoglycemia throughout day on 3/9: 44, 39, 43, 51, 70 mg/dL Today: 47, 120, 54, 77  Last basal insulin dose given on 3/8 at 1743 was Semglee 50 units.  Inpatient Diabetes Program Recommendations:    Novolog 0-15 units Q4H   Decrease Novolog to 4 units TID if eating > 50%  Add Semglee 20 units QD (to start when CBG > 120)  Will f/u in am.  Thank you. Lorenda Peck, RD, LDN, [place] Inpatient Diabetes Coordinator [phone]

## 2022-05-11 NOTE — Progress Notes (Signed)
Hypoglycemic Event  CBG: 54  Treatment: 8 oz juice/soda  Symptoms: None  Follow-up CBG: [id number] CBG Result:77  Possible Reasons for Event: Inadequate meal intake and Medication regimen:    Comments/MD notified:Dr. Berkley Harvey

## 2022-05-11 NOTE — Lactation Note (Signed)
This note was copied from a baby's chart. Lactation Consultation Note  Patient Name: Lydia Estrada [id number] Date: 05/11/2022 Age:23 hours  Birthing parent and infant are sleeping upon visit. LC will come back to room at another time as possible.     Alayne Estrella A Higuera Ancidey 05/11/2022, 8:21 AM

## 2022-05-11 NOTE — Lactation Note (Signed)
This note was copied from a baby's chart. Lactation Consultation Note  Patient Name: Lydia Estrada [id number] Date: 05/11/2022 Age:23 hours Reason for consult: Follow-up assessment;Early term 37-38.6wks;Primapara;1st time breastfeeding  Follow up with P1. Lactating parent reports breast discomfort and has not pumped since yesterday. LC noted edematous, engorged breasts. LP leaks with light massage. Provided a pumping top. Reviewed pumping basics and parent is able to collect 50 mL of EBM combined within 15 minutes. LP explains breast are still full and uncomfortable, resumed pumping and instructed to do no more than 15 more minutes. LP is to wear a bra and shells when awake to help with areolar edema.  Pumping time should be reevaluated once engorgement has improved ans start maintenance pumping. Instructed to label expressed breastmilk.  Encouraged to request [facility] for any needs, support or questions. Praised LP for her milk supply, effort and dedication.   Plan: 1-Pump 8-12 in 24 h until breasts feel comfortable and empty.  2-Use shells and bra for edema 3-Use ice for 15 minutes as needed for engorgement. .   All questions answered at this time.    Feeding Mother's Current Feeding Choice: Breast Milk and Formula  LATCH Score Parent prefers to pump exclusively  Lactation Tools Discussed/Used Tools: Pump;Flanges;Hands-free pumping top;Shells Flange Size: 27 Breast pump type: Double-Electric Breast Pump;Manual Pump Education: Setup, frequency, and cleaning;Milk Storage Reason for Pumping: parent preference, engorgement Pumping frequency: every 3h Pumped volume: 50 mL (at 15 minutes)  Interventions Interventions: Education;Expressed milk;Shells;Hand pump;DEBP;Breast massage  Discharge Discharge Education: Engorgement and breast care Pump: DEBP;Manual  Consult Status Consult Status: Follow-up Date: 05/12/22 Follow-up type: In-patient    Garner Dullea A Higuera Ancidey 05/11/2022,  4:54 PM

## 2022-05-12 DIAGNOSIS — Z3A37 37 weeks gestation of pregnancy: Secondary | ICD-10-CM | POA: Diagnosis not present

## 2022-05-12 DIAGNOSIS — O99815 Abnormal glucose complicating the puerperium: Secondary | ICD-10-CM | POA: Diagnosis not present

## 2022-05-12 LAB — GLUCOSE, CAPILLARY
Glucose-Capillary: 118 mg/dL — ABNORMAL HIGH (ref 70–99)
Glucose-Capillary: 184 mg/dL — ABNORMAL HIGH (ref 70–99)
Glucose-Capillary: 190 mg/dL — ABNORMAL HIGH (ref 70–99)
Glucose-Capillary: 236 mg/dL — ABNORMAL HIGH (ref 70–99)
Glucose-Capillary: 43 mg/dL — CL (ref 70–99)
Glucose-Capillary: 63 mg/dL — ABNORMAL LOW (ref 70–99)
Glucose-Capillary: 70 mg/dL (ref 70–99)
Glucose-Capillary: 72 mg/dL (ref 70–99)
Glucose-Capillary: 76 mg/dL (ref 70–99)
Glucose-Capillary: 81 mg/dL (ref 70–99)

## 2022-05-12 LAB — COMPREHENSIVE METABOLIC PANEL
ALT: 7 U/L (ref 0–44)
AST: 14 U/L — ABNORMAL LOW (ref 15–41)
Albumin: 1.6 g/dL — ABNORMAL LOW (ref 3.5–5.0)
Alkaline Phosphatase: 89 U/L (ref 38–126)
Anion gap: 12 (ref 5–15)
BUN: 6 mg/dL (ref 6–20)
CO2: 22 mmol/L (ref 22–32)
Calcium: 8.3 mg/dL — ABNORMAL LOW (ref 8.9–10.3)
Chloride: 106 mmol/L (ref 98–111)
Creatinine, Ser: 0.74 mg/dL (ref 0.44–1.00)
GFR, Estimated: >60 mL/min (ref >60)
Glucose, Bld: 72 mg/dL (ref 70–99)
Potassium: 3.4 mmol/L — ABNORMAL LOW (ref 3.5–5.1)
Sodium: 140 mmol/L (ref 135–145)
Total Bilirubin: 0.2 mg/dL — ABNORMAL LOW (ref 0.3–1.2)
Total Protein: 5.1 g/dL — ABNORMAL LOW (ref 6.5–8.1)

## 2022-05-12 MED ORDER — INSULIN ASPART 100 UNIT/ML IJ SOLN: 3.0000 [IU] | Freq: Once | INTRAMUSCULAR | Status: DC

## 2022-05-12 MED ORDER — INSULIN ASPART 100 UNIT/ML IJ SOLN
0.0000 [IU] | Freq: Three times a day (TID) | INTRAMUSCULAR | Status: DC
  Administered 2022-05-12: 2 [IU] via SUBCUTANEOUS
  Administered 2022-05-13: 1 [IU] via SUBCUTANEOUS

## 2022-05-12 MED ORDER — INSULIN ASPART 100 UNIT/ML IJ SOLN
3.0000 [IU] | INTRAMUSCULAR | Status: AC
  Administered 2022-05-13: 3 [IU] via SUBCUTANEOUS

## 2022-05-12 MED ORDER — INSULIN GLARGINE-YFGN 100 UNIT/ML ~~LOC~~ SOLN: 15.0000 [IU] | Freq: Every day | SUBCUTANEOUS | Status: DC

## 2022-05-12 MED ORDER — FUROSEMIDE 20 MG PO TABS
20.0000 mg | ORAL_TABLET | Freq: Every day | ORAL | Status: DC
  Administered 2022-05-12 – 2022-05-13 (×2): 20 mg via ORAL
  Filled 2022-05-12 (×2): qty 1

## 2022-05-12 NOTE — Inpatient Diabetes Management (Addendum)
Inpatient Diabetes Program Recommendations  AACE/ADA: New Consensus Statement on Inpatient Glycemic Control (2015)  Target Ranges:  Prepandial:   less than 140 mg/dL      Peak postprandial:   less than 180 mg/dL (1-2 hours)      Critically ill patients:  140 - 180 mg/dL   Lab Results  Component Value Date   GLUCAP 72 05/12/2022   HGBA1C 7.5 (H) 03/14/2022    Review of Glycemic Control  Latest Reference Range & Units 05/12/22 00:20 05/12/22 00:42 05/12/22 04:06 05/12/22 08:02 05/12/22 08:41  Glucose-Capillary 70 - 99 mg/dL 63 (L) 81 70 43 (LL) 72   Diabetes history: DM1 (per endo note- C-Peptide 2.1 at diagnosis) Home: Humalog 1:1 ~ 30-40 units TID, Tresiba 60 units BID, Metformin 1500 mg QD Prior to Pregnancy: Tresiba 108 units QD, Humalog 1:4  Current orders for Inpatient glycemic control: Semglee 15 QD, Novolog 0-15 units Q4H, Novolog 4 units TID Decadron 16 mg x 1   Last basal insulin dose given on 3/8 at 1743 was Semglee 50 units.   Inpatient Diabetes Program Recommendations:    Noted multiple episodes of hypoglycemia. Also noted that while on IV insulin drip rate ~ 0.5-0.8 units/hr.   Consider: -Discontinuing Novolog 4 units TID -Changing correction to Novolog 0-6 units Q4H - Discontinuing Semglee 15 QD  Anticipate basal needs to slowly increase. Once CBGs consistently above 140 mg/dL would add Semglee 10 units QD/HS.   Spoke with patient at length regarding inpatient plan of care in reference to diabetes.  Reviewed patient's current A1c of 7.5%. Explained what a A1c is and what it measures. Also reviewed goal A1c with patient, importance of good glucose control @ home, and blood sugar goals. Reviewed patho of diabetes following pregnancy to include hormonal fluctuations, impact of insulin resistance, current insulin dosages and trends, impact of steroids during intraop and usually response includes hyperglycemia, current recommendations and reasoning for current plan,  hypoglycemia, and to anticipate insulin needs to slowly increase creating a need for titration over next few weeks.   During conversation patient became tearful and angry with hospitalization. Feels that she could do a better job with insulin management and wants to work with outpatient endocrinology. Encouraged to call make appointment and reviewed questions to ask when they returned her call to address concerns. When asked specific questions regarding plan for her dosing it was the doses that she is currently receiving while inpatient while experiencing hypoglycemia. Active listening provided, encouragement and reviewed importance of wanting to keep safely as a priority in immediate post partum period. Patient requests to discuss with Dr Kennon Rounds and wanting to leave.   Spoke with Dr Kennon Rounds via phone to review discussion and patient request. Informed that patient wants to use insulin from home and that current recommendations would be to continue with Novolog 0-6 units Q4H at this time and could allow patient to use short acting insulin from home to substitute.  MD plans to come speak with patient.  Thanks, Bronson Curb, MSN, RNC-OB Diabetes Coordinator [phone] (8a-5p)

## 2022-05-12 NOTE — Progress Notes (Signed)
Hypoglycemic Event  CBG: 63  Treatment: 4 oz juice/soda  Symptoms: None  Follow-up CBG: Time:0042 CBG Result:81  Possible Reasons for Event: Medication regimen  Comments/MD notified: Dr. Harolyn Rutherford notified. No new orders received.     Benedict Needy, RN

## 2022-05-12 NOTE — Progress Notes (Signed)
Subjective: Postpartum Day 3: Cesarean Delivery Patient reports incisional pain, tolerating PO, + BM, and no problems voiding.    Objective: Vital signs in last 24 hours: Temp:  [97.7 F (36.5 C)-98 F (36.7 C)] 98 F (36.7 C) (03/11 0805) Pulse Rate:  [72-85] 76 (03/11 0805) Resp:  [17-18] 17 (03/11 0805) BP: (117-140)/(58-75) 120/58 (03/11 0805) SpO2:  [98 %-99 %] 99 % (03/11 0805) Vitals:   05/11/22 1732 05/11/22 1959 05/12/22 0018 05/12/22 0805  BP: 121/68 129/69 (!) 140/75 (!) 120/58  Pulse: 78 74 85 76  Resp: '18 18 18 17  '$ Temp: 97.7 F (36.5 C) 98 F (36.7 C) 98 F (36.7 C) 98 F (36.7 C)  TempSrc: Oral Oral Oral Oral  SpO2: 98% 98% 98% 99%  Weight:      Height:       Physical Exam:  General: alert, cooperative, and appears stated age 6: appropriate Uterine Fundus: firm Incision: healing well, no significant drainage DVT Evaluation: No evidence of DVT seen on physical exam.  Recent Labs    05/10/22 0357  HGB 9.8*  HCT 30.4*    Assessment/Plan: Status post Cesarean section. Doing well postoperatively.  Continue current care. Added Lasix due to elevated BP, consider procardia if needed Remains hypoglycemic--hold long acting insulin and hold meal coverage per diabetes education. Change to less sensitive SSI. Probable DC in am.  Donnamae Jude, MD 05/12/2022, 10:08 AM

## 2022-05-12 NOTE — Plan of Care (Signed)
NT went into Pt room at 1633 to obtain vital signs and pt refused vitals at this time, was told to come back later.

## 2022-05-12 NOTE — Lactation Note (Signed)
This note was copied from a baby's chart. Lactation Consultation Note  Patient Name: Lydia Estrada [id number] Date: 05/12/2022 Age:23 days Reason for consult: Follow-up assessment;Hyperbilirubinemia;Early term 37-38.6wks;Maternal endocrine disorder Mom was holding baby and smiling. Mom stated this was the first time she had out from under the lights since 2pm.  Mom is pumping and collecting good transitional BM. Suggested mom increase feeding amount to 50-60 ml per feeding. Mom stated Dr. Suggested 50 ml. Reminded mom of increased feeding amounts according to hours of age. Reviewed pumping, breast massage while pumping. Mom is pumping one breast at a time and the other breast was leaking. Asked mom why she couldn't pump both mom stated she needed support. Suggested a haakaa just to collect leaking while pumping then pump opposite breast as well. Making sure not to let the haakaa be the pump, its only to collect leaking milk while pumping opposite breast. Mom stated understanding. Praised mom for all of her milk coming in. Suggested mom wear good supportive bra. Mom stated she had it on as well as shells. Reminded not to sleep at night with shells on. Mom stated she wasn't. Informed if shells hurt remove them. Mom stated hadn't hurt so far. Congratulated mom and call if has questions or concerns.    Maternal Data    Feeding Mother's Current Feeding Choice: Breast Milk and Formula Nipple Type: Dr. Clement Husbands  [facility] Score                    Lactation Tools Discussed/Used    Interventions    Discharge    Consult Status Consult Status: Follow-up Date: 05/13/22 Follow-up type: In-patient    Theodoro Kalata 05/12/2022, 9:53 PM

## 2022-05-13 ENCOUNTER — Other Ambulatory Visit (HOSPITAL_COMMUNITY): Payer: Self-pay

## 2022-05-13 ENCOUNTER — Encounter (HOSPITAL_COMMUNITY): Payer: Self-pay | Admitting: Family Medicine

## 2022-05-13 DIAGNOSIS — Z3A37 37 weeks gestation of pregnancy: Secondary | ICD-10-CM | POA: Diagnosis not present

## 2022-05-13 DIAGNOSIS — Z30017 Encounter for initial prescription of implantable subdermal contraceptive: Secondary | ICD-10-CM | POA: Diagnosis not present

## 2022-05-13 DIAGNOSIS — O99815 Abnormal glucose complicating the puerperium: Secondary | ICD-10-CM | POA: Diagnosis not present

## 2022-05-13 LAB — GLUCOSE, CAPILLARY
Glucose-Capillary: 52 mg/dL — ABNORMAL LOW (ref 70–99)
Glucose-Capillary: 80 mg/dL (ref 70–99)
Glucose-Capillary: 79 mg/dL (ref 70–99)
Glucose-Capillary: 144 mg/dL — ABNORMAL HIGH (ref 70–99)

## 2022-05-13 MED ORDER — INSULIN LISPRO (1 UNIT DIAL) 100 UNIT/ML (KWIKPEN)
150.0000 [IU] | PEN_INJECTOR | Freq: Every day | SUBCUTANEOUS | 2 refills | Status: AC
  Filled 2022-05-13: qty 15, 10d supply, fill #0
  Filled 2022-05-16: qty 42, 30d supply, fill #0
  Filled 2022-07-05: qty 30, 28d supply, fill #0
  Filled 2022-08-14 (×2): qty 30, 28d supply, fill #1
  Filled 2022-09-08: qty 30, 28d supply, fill #2
  Filled 2022-10-24: qty 30, 28d supply, fill #3
  Filled 2022-12-15: qty 30, 28d supply, fill #4
  Filled 2023-01-13: qty 30, 28d supply, fill #5
  Filled 2023-02-14: qty 27, 26d supply, fill #6

## 2022-05-13 MED ORDER — TRESIBA FLEXTOUCH 200 UNIT/ML ~~LOC~~ SOPN
60.0000 [IU] | PEN_INJECTOR | Freq: Two times a day (BID) | SUBCUTANEOUS | 5 refills | Status: AC
  Filled 2022-05-13 – 2022-05-16 (×2): qty 18, 30d supply, fill #0
  Filled 2022-07-05: qty 18, 30d supply, fill #1
  Filled 2022-08-14 (×2): qty 18, 30d supply, fill #2
  Filled 2022-09-08: qty 18, 30d supply, fill #3
  Filled 2022-10-24: qty 18, 30d supply, fill #4
  Filled 2022-12-15: qty 18, 30d supply, fill #5

## 2022-05-13 MED ORDER — FUROSEMIDE 20 MG PO TABS
20.0000 mg | ORAL_TABLET | Freq: Every day | ORAL | 0 refills | Status: DC
  Filled 2022-05-13: qty 5, 5d supply, fill #0

## 2022-05-13 MED ORDER — OXYCODONE HCL 5 MG PO TABS
5.0000 mg | ORAL_TABLET | ORAL | 0 refills | Status: AC | PRN
  Filled 2022-05-13: qty 20, 2d supply, fill #0

## 2022-05-13 MED ORDER — METFORMIN HCL ER 500 MG PO TB24
1500.0000 mg | ORAL_TABLET | Freq: Every day | ORAL | 1 refills | Status: DC
  Filled 2022-05-13 – 2022-07-30 (×3): qty 90, 30d supply, fill #0
  Filled 2022-09-08: qty 90, 30d supply, fill #1
  Filled 2022-10-24: qty 90, 30d supply, fill #2
  Filled 2022-12-15: qty 90, 30d supply, fill #3
  Filled 2023-01-13: qty 90, 30d supply, fill #4

## 2022-05-13 MED ORDER — LIDOCAINE HCL 1 % IJ SOLN
0.0000 mL | Freq: Once | INTRAMUSCULAR | Status: AC | PRN
  Administered 2022-05-13: 20 mL via INTRADERMAL
  Filled 2022-05-13: qty 20

## 2022-05-13 MED ORDER — INSULIN LISPRO (1 UNIT DIAL) 100 UNIT/ML (KWIKPEN)
30.0000 [IU] | PEN_INJECTOR | Freq: Three times a day (TID) | SUBCUTANEOUS | 3 refills | Status: DC
  Filled 2022-05-13 – 2022-05-16 (×2): qty 18, 15d supply, fill #0
  Filled 2022-06-02: qty 18, 18d supply, fill #0
  Filled 2022-07-05 – 2022-07-15 (×3): qty 18, 18d supply, fill #1

## 2022-05-13 MED ORDER — ETONOGESTREL 68 MG ~~LOC~~ IMPL
68.0000 mg | DRUG_IMPLANT | Freq: Once | SUBCUTANEOUS | Status: AC
  Administered 2022-05-13: 68 mg via SUBCUTANEOUS
  Filled 2022-05-13: qty 1

## 2022-05-13 NOTE — Progress Notes (Signed)
Hypoglycemic Event  CBG: 52  Treatment: 8 oz juice/soda  Symptoms: None  Follow-up CBG: Time:0630 CBG Result:80  Possible Reasons for Event: Medication Regimen  Comments/MD notified:Dr. Anyanwu notified. No new orders received.    Benedict Needy, RN

## 2022-05-13 NOTE — Inpatient Diabetes Management (Addendum)
Inpatient Diabetes Program Recommendations  AACE/ADA: New Consensus Statement on Inpatient Glycemic Control (2015)  Target Ranges:  Prepandial:   less than 140 mg/dL      Peak postprandial:   less than 180 mg/dL (1-2 hours)      Critically ill patients:  140 - 180 mg/dL   Lab Results  Component Value Date   GLUCAP 79 05/13/2022   HGBA1C 7.5 (H) 03/14/2022    Review of Glycemic Control  Latest Reference Range & Units 05/12/22 23:35 05/13/22 06:13 05/13/22 06:30 05/13/22 07:49  Glucose-Capillary 70 - 99 mg/dL 236 (H) 52 (L) 80 79  (H): Data is abnormally high (L): Data is abnormally low Diabetes history: DM1 (per endo note- C-Peptide 2.1 at diagnosis) Home: Humalog 1:1 ~ 30-40 units TID, Tresiba 60 units BID, Metformin 1500 mg QD Prior to Pregnancy: Tresiba 108 units QD, Humalog 1:4  Current orders for Inpatient glycemic control: Novolog 0-9 units TID & HS, Novolog 3 units x 1 Decadron 16 mg x 1 05/09/22   Last basal insulin dose given on 3/8 at 1743 was Semglee 50 units.   Inpatient Diabetes Program Recommendations:     Noted FSBG of 52 mg/dL. This is following one time dose of Novolog 3 units.   Reaching out to Eilene Ghazi, outpatient endocrinology to notify of inpatient trends, current dosages, multiple episodes of hypoglycemia and to establish outpatient plan for safety.    Addendum '@1230'$ : Spoke with Eilene Ghazi to further discuss patient outpatient plan of care. Provider in agreement at this time to hold basal insulin until glucose fastings reach 150 mg/dL then patient is to reach out to provider for dosing recommendations.  In preparation for discharge:  -Novolog 0-9 units TID -Novolog 0-5 units QHS - Once CBGs exceed 150 mg/dL contact endocrinology for basal dose  Spoke with patient regarding discussion and recommendations for discharge. Patient much more accepting today and felt in agreement with plan. Reports that she had been watching on her Dexcom and paying more  attention and was shocked that she is only requiring such small doses compared to prior. Again reviewed previous C-Peptide level and questions to ask at next visit. Reviewed with patient that insulin needs can be expected to changes significantly over next few weeks especially depending on CHO intake. Patient aware to reach out to [facility] once CBGs exceed 150 mg/dL for basal dose. Plans to make follow up appointment and encouraged to reach out to office with further questions.    Thanks, Bronson Curb, MSN, RNC-OB Diabetes Coordinator [phone] (8a-5p)

## 2022-05-13 NOTE — Discharge Summary (Signed)
Postpartum Discharge Summary    Patient Name: Lydia Estrada DOB: February 17, 2001 MRN: [id number]  Date of admission: 05/08/2022 Delivery date:05/09/2022  Delivering provider: Laurey Arrow [place]  Date of discharge: 05/13/2022  Admitting diagnosis: Diabetes mellitus in pregnancy in third trimester [O24.913] Intrauterine pregnancy: [redacted]w[redacted]d    Secondary diagnosis:  Principal Problem:   S/P emergency cesarean section Active Problems:   Severe recurrent major depression without psychotic features ([place]   Attention deficit hyperactivity disorder (ADHD), predominantly inattentive type   Morbid obesity ([place]   Supervision of high-risk pregnancy   Type 1 diabetes mellitus during pregnancy, third trimester   Late prenatal care affecting pregnancy in third trimester   HSV-2 seropositive   LGA (large for gestational age) fetus affecting management of mother   Group B Streptococcus carrier, +RV culture, currently pregnant   Acute postoperative anemia due to expected blood loss  Additional problems: postpartum hypoglycemia    Discharge diagnosis: Term Pregnancy Delivered and Type 1 DM                                               Post partum procedures: glycemic control Augmentation: AROM, Pitocin, Cytotec, and IP Foley Complications: None  Hospital course: Induction of Labor With Cesarean Section   22y.o. yo G2P1011 at 398w1das admitted to the hospital 05/08/2022 for induction of labor. Patient had a labor course significant for cytotec, AROM, Pitocin, recurrent decels. The patient went for cesarean section due to Non-Reassuring FHR. Delivery details are as follows: Membrane Rupture Time/Date: 4:41 AM ,05/09/2022   Delivery Method:C-Section, Low Transverse  Details of operation can be found in separate operative Note.  Patient had a postpartum course complicated by hypoglycemia. Needs for holding insulin. Seen by diabetes educator and weaned off insulin and on SSI only. On lasix. Normotensive.  Post-placental Nexplanon placed. She is ambulating, tolerating a regular diet, passing flatus, and urinating well.  Patient is discharged home in stable condition on 05/13/22.      Newborn Data: Birth date:05/09/2022  Birth time:1:35 PM  Gender:Female  Living status:Living  Apgars:7 ,9  Weight:4130 g                                Magnesium Sulfate received: No BMZ received: No Rhophylac:N/A MMR:No T-DaP:Given prenatally Flu: No Transfusion:No  Physical exam  Vitals:   05/12/22 2012 05/12/22 2339 05/13/22 0754 05/13/22 1220  BP: 123/87 102/72 114/65 124/79  Pulse: 88 75 78 86  Resp: '19 17 18 19  '$ Temp: 98.2 F (36.8 C) 98 F (36.7 C) 97.8 F (36.6 C) 98.4 F (36.9 C)  TempSrc: Oral Oral Oral Oral  SpO2: 96% 99% 99% 99%  Weight:      Height:       General: alert, cooperative, and no distress Lochia: appropriate Uterine Fundus: firm Incision: Healing well with no significant drainage DVT Evaluation: No evidence of DVT seen on physical exam. Labs: Lab Results  Component Value Date   WBC 11.3 (H) 05/10/2022   HGB 9.8 (L) 05/10/2022   HCT 30.4 (L) 05/10/2022   MCV 79.4 (L) 05/10/2022   PLT 185 05/10/2022      Latest Ref Rng & Units 05/12/2022    9:46 AM  CMP  Glucose 70 - 99 mg/dL 72   BUN  6 - 20 mg/dL 6   Creatinine 0.44 - 1.00 mg/dL 0.74   Sodium 135 - 145 mmol/L 140   Potassium 3.5 - 5.1 mmol/L 3.4   Chloride 98 - 111 mmol/L 106   CO2 22 - 32 mmol/L 22   Calcium 8.9 - 10.3 mg/dL 8.3   Total Protein 6.5 - 8.1 g/dL 5.1   Total Bilirubin 0.3 - 1.2 mg/dL 0.2   Alkaline Phos 38 - 126 U/L 89   AST 15 - 41 U/L 14   ALT 0 - 44 U/L 7    Edinburgh Score:    05/12/2022    6:14 AM  Flavia Shipper Postnatal Depression Scale Screening Tool  I have been able to laugh and see the funny side of things. 0  I have looked forward with enjoyment to things. 0  I have blamed myself unnecessarily when things went wrong. 2  I have been anxious or worried for no good reason. 2  I  have felt scared or panicky for no good reason. 1  Things have been getting on top of me. 1  I have been so unhappy that I have had difficulty sleeping. 2  I have felt sad or miserable. 0  I have been so unhappy that I have been crying. 0  The thought of harming myself has occurred to me. 0  Edinburgh Postnatal Depression Scale Total 8     After visit meds:  Allergies as of 05/13/2022       Reactions   Bee Venom Anaphylaxis   Pomegranate (punica Granatum) Anaphylaxis   Grapefruit Concentrate    Ibuprofen    Throat swelling   Naproxen    Other    Seaweed, bee sting (anaphalytic)         Medication List     TAKE these medications    aspirin EC 81 MG tablet Commonly known as: Aspirin 81 Take 1 tablet (81 mg total) by mouth daily.   Baqsimi Two Pack 3 MG/DOSE Powd Generic drug: Glucagon Place 1 spray into one nostril once for 1 dose.  May repeat 1 spray in 15 minutes if inadequate response.   Gvoke HypoPen 1-Pack 1 MG/0.2ML Soaj Generic drug: Glucagon inject 1 mg by subcutaneous route once in the abdomen, thigh, or upper arm may repeat in 15 minutes if inadequate response   Gvoke HypoPen 2-Pack 1 MG/0.2ML Soaj Generic drug: Glucagon Use as directed for hypoglycemia   buPROPion 300 MG 24 hr tablet Commonly known as: WELLBUTRIN XL Take 1 tablet (300 mg total) by mouth daily. Take along with '150mg'$  tablet for a total of 450 mg daily.   buPROPion 150 MG 24 hr tablet Commonly known as: WELLBUTRIN XL Take 1 tablet (150 mg total) by mouth daily.   EPINEPHrine 0.3 mg/0.3 mL Soaj injection Commonly known as: EPI-PEN See admin instructions.   FREESTYLE LITE test strip Generic drug: glucose blood See admin instructions.   furosemide 20 MG tablet Commonly known as: LASIX Take 1 tablet (20 mg total) by mouth daily for 5 days. Start taking on: May 14, 2022   HumaLOG KwikPen 200 UNIT/ML KwikPen Generic drug: insulin lispro Inject 150 Units into the skin daily. Carb  counting sliding scale - resume after consultation with endocrinology 70-120 - 0 units 121-150 - 1 unit 151-200 - 2 units 201-250 - 3 units 251-300 - 5 units 301-350 - 7 units 351-400 - 9 units What changed: additional instructions   HumaLOG KwikPen 200 UNIT/ML KwikPen Generic drug: insulin lispro  Inject 30-40 Units into the skin 3 (three) times daily with meals plus correctional insulin. Max 150 units/day What changed: Another medication with the same name was changed. Make sure you understand how and when to take each.   hydrOXYzine 25 MG tablet Commonly known as: ATARAX 1 tablet as needed   imiquimod 5 % cream Commonly known as: Aldara Apply topically 3 (three) times a week.   metFORMIN 500 MG 24 hr tablet Commonly known as: GLUCOPHAGE-XR Take 3 tablets (1,500 mg total) by mouth daily with breakfast. resume after consultation with endocrinology What changed:  how much to take how to take this when to take this additional instructions   oxyCODONE 5 MG immediate release tablet Commonly known as: Oxy IR/ROXICODONE Take 1-2 tablets (5-10 mg total) by mouth every 4 (four) hours as needed for moderate pain.   prazosin 2 MG capsule Commonly known as: MINIPRESS Take 1 capsule (2 mg total) by mouth at bedtime.   PRENATAL PO Take 1 tablet by mouth daily.   promethazine 25 MG tablet Commonly known as: PHENERGAN Take 1 tablet (25 mg total) by mouth every 6 (six) hours as needed for nausea or vomiting.   sertraline 100 MG tablet Commonly known as: ZOLOFT Take 2 tablets (200 mg total) by mouth daily.   TechLite Pen Needles 32G X 4 MM Misc Generic drug: Insulin Pen Needle USE TO INJECT INSULIN 6 TIMES A DAY   Tresiba FlexTouch 200 UNIT/ML FlexTouch Pen Generic drug: insulin degludec Inject 60 Units into the skin in the morning and at bedtime. Resume after discussion with Endocrinology What changed: additional instructions   valACYclovir 500 MG tablet Commonly known  as: Valtrex Take 1 tablet (500 mg total) by mouth 2 (two) times daily.         Discharge home in stable condition Infant Feeding: Bottle and Breast Infant Disposition:home with mother Discharge instruction: per After Visit Summary and Postpartum booklet. Activity: Advance as tolerated. Pelvic rest for 6 weeks.  Diet: carb modified diet Future Appointments: Future Appointments  Date Time Provider [place]  05/23/2022  9:00 AM Norman Clay, MD [facility] None   Follow up Visit:  [place] [facility] [place] [facility] Follow up in 1 week(s).   Specialty: Obstetrics and Gynecology Why: postop check, blood pressure check - they will remove your Prevena wound vac Contact information: [address] [place] [phone]                 Please schedule this patient for a Virtual postpartum visit in 6 weeks with the following provider: MD. Additional Postpartum F/U:Postpartum Depression checkup, Incision check 1 week, and BP check 1 week  High risk pregnancy complicated by:  0000000 Delivery mode:  C-Section, Low Transverse  Anticipated Birth Control:  PP Nexplanon placed   05/13/2022 Donnamae Jude, MD

## 2022-05-13 NOTE — Lactation Note (Signed)
This note was copied from a baby's chart. Lactation Consultation Note  Patient Name: Lydia Estrada [id number] Date: 05/13/2022 Age:23 days Reason for consult: Follow-up assessment;Early term 37-38.6wks;1st time breastfeeding;Infant weight loss (Pumping only and formula feeding infant. Birth Parent TIDM, LGA infant and C/S see Birth Parent's MR.) Birth Parent feeding preference is : "Pumping only" and formula feeding infant. Per Birth Parent, she is pumping every 3 hours for 15 minutes and expressing 180 mls per pumping session. Infant is consuming 60 mls or more per feeding, infant had 2 stools that are brownish green and 5 voids today. Infant is feeding by cues, on demand, 8+ time within 24 hours. When LC entered the room, Support Person was pace feeding infant  60 mls of formula, Per Birth Parent, she has been waiting for discharge and medications from pharmacy to be delivered. Per Birth Parent infant is mostly receiving her EBM and she only plans to offer infant formula rarely.  She has EBM in her car in a cooler. Birth Parent has been given  hand out "Storage and Preparation of Breast milk -Guidelines". Birth Parent has DEBP at home and plans to continue to pump every 3 hours but no longer on initial setting how that her colostrum has started  transitioning  to Lactogenesis III. Birth Parent given handout of O/P services, breastfeeding support groups, community resources, and our phone # for post-discharge questions.    LC discussed discharge education: Engorgement prevention and treatment, Infant's input and output, how to know feedings are going well, and  warning signs of dehydration and prevention in infant.   Maternal Data    Feeding Mother's Current Feeding Choice: Breast Milk and Formula   LATCH Score   None, Birth Parent is "Pumping Only" and formula feeding infant.                  Lactation Tools Discussed/Used Tools: Pump Flange Size: 24 Pumped volume: 180 mL (Birth  Parent is expressing 180 mls per pumping session.)  Interventions Interventions: DEBP;Pace feeding;Education  Discharge Discharge Education: Engorgement and breast care;Warning signs for feeding baby;Outpatient recommendation Pump: DEBP  Consult Status Consult Status: Complete Date: 05/13/22 Follow-up type: Physician    Eulis Canner 05/13/2022, 4:23 PM

## 2022-05-13 NOTE — Progress Notes (Signed)
CSW met with MOB at infant's bedside in room 115 to reassess for safety per RN.  When CSW arrived, MOB was fixing infant's bottle and FOB was holding infant.  CSW introduced self and explained the need for CSW to complete an assessment. With MOB's permission, CSW asked FOB to leave in order for CSW to assess MOB in private.  CSW appeared happy and receptive to meeting with CSW.  Without prompting, MOB happily shared that she and infant will be discharging today.  CSW assessed for psychosocial stressors and MOB denied all stressors and reported having all essential items to care for infant post discharge.  CSW assessed for safety and MOB denied SI, HI, AVH, and DV.  MOB acknowledged feeling frustrated yesterday after Teaching Service Peds communicated that infant was not medically ready for discharge. MOB reported she has since calmed down and understands why infant was not medically ready. MOB was open to PMAD education and continue to communicated that she has a psychiatrist and a therapist that she meets with weekly.  There are no barriers to discharge.  Laurey Arrow, MSW, LCSW Clinical Social Work [phone]

## 2022-05-13 NOTE — Progress Notes (Signed)
Patient ID: Lydia Estrada, female   DOB: 04/14/2000, 22 y.o.   MRN: [id number] Patient given informed consent, signed copy in the chart, time out was performed.  Appropriate time out taken.  Patient's left arm was prepped and draped in the usual sterile fashion.. The ruler used to measure and mark insertion area.  Pt was prepped with alcohol swab and then injected with 3 cc of 1% lidocaine with epinephrine.  Pt was prepped with betadine, Nexplanon removed form packaging,  Device confirmed in needle, then inserted full length of needle and withdrawn per handbook instructions.  Pt insertion site covered with steristrips and pressure dressing.   Minimal blood loss.  Pt tolerated the procedure well.

## 2022-05-15 ENCOUNTER — Encounter: Payer: Self-pay | Admitting: Obstetrics and Gynecology

## 2022-05-16 ENCOUNTER — Other Ambulatory Visit: Payer: Self-pay | Admitting: Family Medicine

## 2022-05-16 ENCOUNTER — Other Ambulatory Visit (HOSPITAL_COMMUNITY): Payer: Self-pay

## 2022-05-17 ENCOUNTER — Other Ambulatory Visit (HOSPITAL_COMMUNITY): Payer: Self-pay

## 2022-05-19 ENCOUNTER — Other Ambulatory Visit (HOSPITAL_COMMUNITY): Payer: Self-pay

## 2022-05-19 ENCOUNTER — Other Ambulatory Visit: Payer: Self-pay

## 2022-05-20 ENCOUNTER — Other Ambulatory Visit: Payer: Self-pay

## 2022-05-20 ENCOUNTER — Ambulatory Visit (INDEPENDENT_AMBULATORY_CARE_PROVIDER_SITE_OTHER): Payer: Commercial Managed Care - PPO

## 2022-05-20 VITALS — BP 125/71 | HR 99 | Wt 309.0 lb

## 2022-05-20 DIAGNOSIS — Z5189 Encounter for other specified aftercare: Secondary | ICD-10-CM

## 2022-05-20 NOTE — Progress Notes (Signed)
Incision Check Visit  Lydia Estrada is here for incision check following primary c-section on 05/09/22. Provena removed. Incision is clean, dry, and intact with fully approximated edges. Red, irritated skin to left lower edge of incision; appears to be healthy skin irritated by adhesive. Reviewed good wound care and s/s of infection with patient. Diabetes not well controlled -- will follow up with endocrinology.  Patient will follow up at [facility] visit with Elgie Congo, MD 06/30/22.  Annabell Howells, RN 05/20/2022  5:05 PM
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
## 2022-05-21 DIAGNOSIS — Z794 Long term (current) use of insulin: Secondary | ICD-10-CM | POA: Diagnosis not present

## 2022-05-21 DIAGNOSIS — E109 Type 1 diabetes mellitus without complications: Secondary | ICD-10-CM | POA: Diagnosis not present

## 2022-05-21 DIAGNOSIS — E1065 Type 1 diabetes mellitus with hyperglycemia: Secondary | ICD-10-CM | POA: Diagnosis not present

## 2022-05-22 ENCOUNTER — Encounter: Payer: Self-pay | Admitting: Obstetrics and Gynecology

## 2022-05-23 ENCOUNTER — Encounter: Payer: Commercial Managed Care - PPO | Admitting: Psychiatry

## 2022-05-23 ENCOUNTER — Telehealth: Payer: Self-pay | Admitting: Psychiatry

## 2022-05-23 NOTE — Progress Notes (Signed)
This encounter was created in error - please disregard.

## 2022-05-23 NOTE — Telephone Encounter (Signed)
Sent a video visit link through [place], but the patient did not sign in. Attempted to call for today's appointment, and the patient answered the phone. She apologized, mentioning that this appointment had slipped her mind as she needs to attend to her daughter's appointment. She acknowledges that this is considered a no-show per policy and agrees to reschedule for the following appointment.  April 12 at 10 am, video

## 2022-05-28 ENCOUNTER — Encounter: Payer: Self-pay | Admitting: Obstetrics and Gynecology

## 2022-05-28 DIAGNOSIS — E1065 Type 1 diabetes mellitus with hyperglycemia: Secondary | ICD-10-CM | POA: Diagnosis not present

## 2022-05-29 ENCOUNTER — Other Ambulatory Visit (HOSPITAL_COMMUNITY): Payer: Self-pay

## 2022-05-30 ENCOUNTER — Other Ambulatory Visit (HOSPITAL_COMMUNITY): Payer: Self-pay

## 2022-05-31 ENCOUNTER — Other Ambulatory Visit (HOSPITAL_COMMUNITY): Payer: Self-pay

## 2022-05-31 MED ORDER — HUMALOG KWIKPEN 200 UNIT/ML ~~LOC~~ SOPN
PEN_INJECTOR | SUBCUTANEOUS | 1 refills | Status: DC
  Filled 2022-05-31: qty 12, 28d supply, fill #0
  Filled 2022-06-02: qty 21, 28d supply, fill #0
  Filled 2022-07-05 – 2022-07-15 (×2): qty 21, 28d supply, fill #1
  Filled 2022-07-30: qty 6, 8d supply, fill #1
  Filled 2022-08-14 – 2022-09-23 (×3): qty 21, 28d supply, fill #1
  Filled 2022-09-25: qty 6, 8d supply, fill #1

## 2022-05-31 MED ORDER — GVOKE HYPOPEN 2-PACK 1 MG/0.2ML ~~LOC~~ SOAJ
SUBCUTANEOUS | 1 refills | Status: DC
  Filled 2022-05-31: qty 0.2, 14d supply, fill #0
  Filled 2022-06-02: qty 0.4, 30d supply, fill #0
  Filled 2022-07-05: qty 0.4, 14d supply, fill #0
  Filled 2022-10-24: qty 0.4, 14d supply, fill #1

## 2022-06-02 ENCOUNTER — Other Ambulatory Visit: Payer: Self-pay | Admitting: Psychiatry

## 2022-06-02 ENCOUNTER — Other Ambulatory Visit: Payer: Self-pay

## 2022-06-02 ENCOUNTER — Other Ambulatory Visit (HOSPITAL_COMMUNITY): Payer: Self-pay

## 2022-06-03 ENCOUNTER — Other Ambulatory Visit: Payer: Self-pay

## 2022-06-03 MED ORDER — PRAZOSIN HCL 2 MG PO CAPS
2.0000 mg | ORAL_CAPSULE | Freq: Every day | ORAL | 5 refills | Status: DC
  Filled 2022-06-03: qty 30, 30d supply, fill #0
  Filled 2022-07-05: qty 30, 30d supply, fill #1
  Filled 2022-08-14 (×2): qty 30, 30d supply, fill #2
  Filled 2022-09-08: qty 30, 30d supply, fill #3
  Filled 2022-10-24: qty 30, 30d supply, fill #4
  Filled 2022-11-18: qty 30, 30d supply, fill #5

## 2022-06-05 ENCOUNTER — Other Ambulatory Visit: Payer: Self-pay

## 2022-06-05 ENCOUNTER — Other Ambulatory Visit (HOSPITAL_COMMUNITY): Payer: Self-pay

## 2022-06-08 DIAGNOSIS — N200 Calculus of kidney: Secondary | ICD-10-CM | POA: Diagnosis not present

## 2022-06-08 DIAGNOSIS — N132 Hydronephrosis with renal and ureteral calculous obstruction: Secondary | ICD-10-CM | POA: Diagnosis not present

## 2022-06-08 DIAGNOSIS — Z888 Allergy status to other drugs, medicaments and biological substances status: Secondary | ICD-10-CM | POA: Diagnosis not present

## 2022-06-08 DIAGNOSIS — E119 Type 2 diabetes mellitus without complications: Secondary | ICD-10-CM | POA: Diagnosis not present

## 2022-06-09 ENCOUNTER — Other Ambulatory Visit (HOSPITAL_COMMUNITY): Payer: Self-pay

## 2022-06-10 ENCOUNTER — Other Ambulatory Visit (HOSPITAL_COMMUNITY): Payer: Self-pay

## 2022-06-10 ENCOUNTER — Encounter (HOSPITAL_COMMUNITY): Payer: Self-pay

## 2022-06-11 ENCOUNTER — Other Ambulatory Visit (HOSPITAL_COMMUNITY): Payer: Self-pay

## 2022-06-11 ENCOUNTER — Other Ambulatory Visit: Payer: Self-pay

## 2022-06-11 MED ORDER — INSULIN LISPRO 100 UNIT/ML IJ SOLN
100.0000 [IU] | Freq: Every day | INTRAMUSCULAR | 5 refills | Status: DC
  Filled 2022-06-11: qty 30, 30d supply, fill #0
  Filled 2022-07-05: qty 30, 30d supply, fill #1
  Filled 2022-08-14 (×2): qty 30, 30d supply, fill #2
  Filled 2022-09-08: qty 30, 30d supply, fill #3
  Filled 2022-10-24: qty 30, 30d supply, fill #4
  Filled 2022-12-15 – 2022-12-25 (×2): qty 30, 30d supply, fill #5

## 2022-06-12 ENCOUNTER — Other Ambulatory Visit (HOSPITAL_COMMUNITY): Payer: Self-pay

## 2022-06-12 ENCOUNTER — Other Ambulatory Visit: Payer: Self-pay

## 2022-06-12 DIAGNOSIS — Z794 Long term (current) use of insulin: Secondary | ICD-10-CM | POA: Diagnosis not present

## 2022-06-12 DIAGNOSIS — E1065 Type 1 diabetes mellitus with hyperglycemia: Secondary | ICD-10-CM | POA: Diagnosis not present

## 2022-06-12 DIAGNOSIS — E109 Type 1 diabetes mellitus without complications: Secondary | ICD-10-CM | POA: Diagnosis not present
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
## 2022-06-13 ENCOUNTER — Telehealth (INDEPENDENT_AMBULATORY_CARE_PROVIDER_SITE_OTHER): Payer: Commercial Managed Care - PPO | Admitting: Psychiatry

## 2022-06-13 ENCOUNTER — Encounter: Payer: Self-pay | Admitting: Psychiatry

## 2022-06-13 ENCOUNTER — Other Ambulatory Visit (HOSPITAL_COMMUNITY): Payer: Self-pay

## 2022-06-13 ENCOUNTER — Other Ambulatory Visit: Payer: Self-pay

## 2022-06-13 DIAGNOSIS — F4310 Post-traumatic stress disorder, unspecified: Secondary | ICD-10-CM | POA: Diagnosis not present

## 2022-06-13 DIAGNOSIS — F3341 Major depressive disorder, recurrent, in partial remission: Secondary | ICD-10-CM | POA: Diagnosis not present

## 2022-06-13 DIAGNOSIS — F1290 Cannabis use, unspecified, uncomplicated: Secondary | ICD-10-CM

## 2022-06-13 DIAGNOSIS — F17210 Nicotine dependence, cigarettes, uncomplicated: Secondary | ICD-10-CM

## 2022-06-13 MED ORDER — BUSPIRONE HCL 10 MG PO TABS
10.0000 mg | ORAL_TABLET | Freq: Three times a day (TID) | ORAL | 0 refills | Status: AC
  Filled 2022-06-13: qty 90, 30d supply, fill #0

## 2022-06-17 ENCOUNTER — Other Ambulatory Visit (HOSPITAL_COMMUNITY): Payer: Self-pay

## 2022-06-17 ENCOUNTER — Other Ambulatory Visit: Payer: Self-pay

## 2022-06-30 ENCOUNTER — Ambulatory Visit: Payer: Commercial Managed Care - PPO | Admitting: Obstetrics and Gynecology

## 2022-06-30 ENCOUNTER — Other Ambulatory Visit: Payer: Self-pay

## 2022-07-02 ENCOUNTER — Other Ambulatory Visit (HOSPITAL_COMMUNITY): Payer: Self-pay

## 2022-07-05 ENCOUNTER — Other Ambulatory Visit (HOSPITAL_COMMUNITY): Payer: Self-pay

## 2022-07-07 ENCOUNTER — Other Ambulatory Visit (HOSPITAL_COMMUNITY): Payer: Self-pay

## 2022-07-07 ENCOUNTER — Other Ambulatory Visit: Payer: Self-pay

## 2022-07-07 MED ORDER — OMNIPOD 5 DEXG7G6 PODS GEN 5 MISC
1.0000 | 5 refills | Status: DC
  Filled 2022-07-07: qty 10, 30d supply, fill #0
  Filled 2022-07-08 – 2022-09-08 (×5): qty 10, 30d supply, fill #1
  Filled 2022-09-27: qty 10, 30d supply, fill #2

## 2022-07-08 ENCOUNTER — Other Ambulatory Visit (HOSPITAL_COMMUNITY): Payer: Self-pay

## 2022-07-08 ENCOUNTER — Other Ambulatory Visit: Payer: Self-pay

## 2022-07-09 ENCOUNTER — Other Ambulatory Visit: Payer: Self-pay
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
## 2022-07-11 ENCOUNTER — Telehealth: Payer: Commercial Managed Care - PPO | Admitting: Psychiatry

## 2022-07-15 ENCOUNTER — Other Ambulatory Visit (HOSPITAL_COMMUNITY): Payer: Self-pay

## 2022-07-15 ENCOUNTER — Other Ambulatory Visit: Payer: Self-pay

## 2022-07-15 MED ORDER — OMNIPOD 5 DEXG7G6 PODS GEN 5 MISC
1.0000 | 5 refills | Status: DC
  Filled 2022-07-15 – 2022-07-30 (×2): qty 15, 30d supply, fill #0
  Filled 2022-09-08 – 2022-09-23 (×2): qty 15, 30d supply, fill #1
  Filled 2022-10-24: qty 15, 30d supply, fill #2
  Filled 2022-11-18: qty 15, 30d supply, fill #3
  Filled 2022-12-25: qty 15, 30d supply, fill #4
  Filled 2023-01-21: qty 15, 30d supply, fill #5

## 2022-07-25 ENCOUNTER — Other Ambulatory Visit (HOSPITAL_COMMUNITY): Payer: Self-pay

## 2022-07-31 ENCOUNTER — Other Ambulatory Visit (HOSPITAL_COMMUNITY): Payer: Self-pay

## 2022-07-31 ENCOUNTER — Other Ambulatory Visit: Payer: Self-pay

## 2022-08-01 ENCOUNTER — Other Ambulatory Visit (HOSPITAL_COMMUNITY): Payer: Self-pay

## 2022-08-11 ENCOUNTER — Other Ambulatory Visit (HOSPITAL_COMMUNITY): Payer: Self-pay

## 2022-08-12 DIAGNOSIS — E1065 Type 1 diabetes mellitus with hyperglycemia: Secondary | ICD-10-CM | POA: Diagnosis not present

## 2022-08-12 DIAGNOSIS — E109 Type 1 diabetes mellitus without complications: Secondary | ICD-10-CM | POA: Diagnosis not present

## 2022-08-12 DIAGNOSIS — Z794 Long term (current) use of insulin: Secondary | ICD-10-CM | POA: Diagnosis not present

## 2022-08-14 ENCOUNTER — Other Ambulatory Visit: Payer: Self-pay

## 2022-08-14 ENCOUNTER — Other Ambulatory Visit (HOSPITAL_COMMUNITY): Payer: Self-pay

## 2022-08-14 MED ORDER — AMOXICILLIN 500 MG PO CAPS
ORAL_CAPSULE | ORAL | 0 refills | Status: AC
  Filled 2022-08-14: qty 22, 7d supply, fill #0
  Filled 2022-08-14: qty 22, 6d supply, fill #0

## 2022-08-15 ENCOUNTER — Other Ambulatory Visit (HOSPITAL_COMMUNITY): Payer: Self-pay
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
## 2022-08-22 ENCOUNTER — Other Ambulatory Visit (HOSPITAL_COMMUNITY): Payer: Self-pay

## 2022-08-25 ENCOUNTER — Encounter: Payer: Commercial Managed Care - PPO | Admitting: Psychiatry

## 2022-08-25 ENCOUNTER — Telehealth: Payer: Self-pay | Admitting: Psychiatry

## 2022-08-25 NOTE — Progress Notes (Signed)
This encounter was created in error - please disregard.

## 2022-08-25 NOTE — Telephone Encounter (Signed)
Sent a video visit link through Epic, but the patient didn't sign in. Tried calling for today's appointment, but got no answer. Left a voicemail instructing the patient to contact the office at [phone].  
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
## 2022-09-02 ENCOUNTER — Encounter: Payer: Self-pay | Admitting: Psychiatry

## 2022-09-02 ENCOUNTER — Telehealth (INDEPENDENT_AMBULATORY_CARE_PROVIDER_SITE_OTHER): Payer: Commercial Managed Care - PPO | Admitting: Psychiatry

## 2022-09-02 DIAGNOSIS — F4310 Post-traumatic stress disorder, unspecified: Secondary | ICD-10-CM | POA: Diagnosis not present

## 2022-09-02 DIAGNOSIS — F330 Major depressive disorder, recurrent, mild: Secondary | ICD-10-CM | POA: Diagnosis not present

## 2022-09-02 MED ORDER — SERTRALINE HCL 100 MG PO TABS
200.0000 mg | ORAL_TABLET | Freq: Every day | ORAL | 5 refills | Status: DC
  Filled 2022-09-02 – 2022-09-23 (×2): qty 60, 30d supply, fill #0
  Filled 2022-10-24: qty 60, 30d supply, fill #1
  Filled 2022-12-15: qty 60, 30d supply, fill #2
  Filled 2023-01-13: qty 60, 30d supply, fill #3
  Filled 2023-02-14: qty 60, 30d supply, fill #4

## 2022-09-02 MED ORDER — BUSPIRONE HCL 15 MG PO TABS
15.0000 mg | ORAL_TABLET | Freq: Three times a day (TID) | ORAL | 2 refills | Status: DC
  Filled 2022-09-02: qty 90, 30d supply, fill #0
  Filled 2022-10-24: qty 90, 30d supply, fill #1
  Filled 2022-11-18: qty 90, 30d supply, fill #2

## 2022-09-03 ENCOUNTER — Other Ambulatory Visit (HOSPITAL_COMMUNITY): Payer: Self-pay

## 2022-09-03 ENCOUNTER — Other Ambulatory Visit: Payer: Self-pay

## 2022-09-03 DIAGNOSIS — E109 Type 1 diabetes mellitus without complications: Secondary | ICD-10-CM | POA: Diagnosis not present

## 2022-09-03 DIAGNOSIS — E1065 Type 1 diabetes mellitus with hyperglycemia: Secondary | ICD-10-CM | POA: Diagnosis not present

## 2022-09-03 DIAGNOSIS — Z794 Long term (current) use of insulin: Secondary | ICD-10-CM | POA: Diagnosis not present

## 2022-09-08 ENCOUNTER — Other Ambulatory Visit (HOSPITAL_COMMUNITY): Payer: Self-pay

## 2022-09-08 ENCOUNTER — Other Ambulatory Visit: Payer: Self-pay

## 2022-09-13 ENCOUNTER — Other Ambulatory Visit (HOSPITAL_COMMUNITY): Payer: Self-pay

## 2022-09-14 DIAGNOSIS — J069 Acute upper respiratory infection, unspecified: Secondary | ICD-10-CM | POA: Diagnosis not present

## 2022-09-14 DIAGNOSIS — Z5329 Procedure and treatment not carried out because of patient's decision for other reasons: Secondary | ICD-10-CM | POA: Diagnosis not present

## 2022-09-14 DIAGNOSIS — Z1152 Encounter for screening for COVID-19: Secondary | ICD-10-CM | POA: Diagnosis not present

## 2022-09-14 DIAGNOSIS — Z7985 Long-term (current) use of injectable non-insulin antidiabetic drugs: Secondary | ICD-10-CM | POA: Diagnosis not present

## 2022-09-14 DIAGNOSIS — E119 Type 2 diabetes mellitus without complications: Secondary | ICD-10-CM | POA: Diagnosis not present

## 2022-09-14 DIAGNOSIS — I959 Hypotension, unspecified: Secondary | ICD-10-CM | POA: Diagnosis not present

## 2022-09-15 ENCOUNTER — Other Ambulatory Visit (HOSPITAL_COMMUNITY): Payer: Self-pay

## 2022-09-16 NOTE — Progress Notes (Signed)
 This encounter was created in error - please disregard.

## 2022-09-17 ENCOUNTER — Other Ambulatory Visit (HOSPITAL_COMMUNITY): Payer: Self-pay

## 2022-09-19 ENCOUNTER — Other Ambulatory Visit (HOSPITAL_COMMUNITY): Payer: Self-pay

## 2022-09-23 ENCOUNTER — Encounter (HOSPITAL_COMMUNITY): Payer: Self-pay

## 2022-09-23 ENCOUNTER — Telehealth: Payer: Self-pay

## 2022-09-23 ENCOUNTER — Other Ambulatory Visit (HOSPITAL_COMMUNITY): Payer: Self-pay

## 2022-09-23 NOTE — Telephone Encounter (Signed)
Attempted to call patient at number listed in chart and as callback number--sent to unidentified VM--left message to call office back so we can discuss concerns.   Maureen Ralphs RN on 09/23/22 at [phone]

## 2022-09-23 NOTE — Telephone Encounter (Signed)
Patient called nurse line and left message stating that she would like to speak with someone regarding the "continuous period" or continuous bleeding she has had since getting nexplanon placed.   Maureen Ralphs RN on 09/23/22 at 1216

## 2022-09-24 ENCOUNTER — Other Ambulatory Visit (HOSPITAL_COMMUNITY): Payer: Self-pay

## 2022-09-24 MED ORDER — MEDROXYPROGESTERONE ACETATE 10 MG PO TABS
10.0000 mg | ORAL_TABLET | Freq: Every day | ORAL | 0 refills | Status: AC
  Filled 2022-09-24: qty 30, 30d supply, fill #0

## 2022-09-24 NOTE — Telephone Encounter (Signed)
Called and spoke with patient. She reports since having her daughter, she has had a period every 2 weeks. She bleeds for 6-7 days and then 9-12 days later it returns. She reports flow is normal. She had Nexplanon placed after delivery. Baby is 67 months old now.   Patient was seen in office on 3/19 for wound check. She was not followed up with post partum appointment.   Spoke with Dr. Debroah Loop who recommended Provera 10 mg every day x 30 days. Informed patient prescription has been sent to her pharmacy. Reviewed to call back if does not help. Patient voiced understanding.

## 2022-09-25 ENCOUNTER — Other Ambulatory Visit (HOSPITAL_COMMUNITY): Payer: Self-pay

## 2022-09-27 ENCOUNTER — Other Ambulatory Visit (HOSPITAL_COMMUNITY): Payer: Self-pay

## 2022-09-29 ENCOUNTER — Other Ambulatory Visit (HOSPITAL_COMMUNITY): Payer: Self-pay

## 2022-09-29 MED ORDER — INSULIN LISPRO 100 UNIT/ML IJ SOLN
100.0000 [IU] | Freq: Every day | INTRAMUSCULAR | 5 refills | Status: DC
  Filled 2022-09-29 – 2022-10-02 (×3): qty 30, 30d supply, fill #0
  Filled 2022-11-18: qty 30, 30d supply, fill #1
  Filled 2022-12-25 – 2023-01-21 (×4): qty 30, 30d supply, fill #2
  Filled 2023-03-05 – 2023-03-26 (×4): qty 30, 30d supply, fill #3
  Filled 2023-05-29: qty 30, 30d supply, fill #4
  Filled 2023-07-08: qty 30, 30d supply, fill #5

## 2022-09-30 ENCOUNTER — Other Ambulatory Visit (HOSPITAL_COMMUNITY): Payer: Self-pay

## 2022-09-30 ENCOUNTER — Other Ambulatory Visit: Payer: Self-pay

## 2022-10-02 ENCOUNTER — Other Ambulatory Visit (HOSPITAL_COMMUNITY): Payer: Self-pay

## 2022-10-02 ENCOUNTER — Other Ambulatory Visit: Payer: Self-pay

## 2022-10-03 ENCOUNTER — Other Ambulatory Visit (HOSPITAL_COMMUNITY): Payer: Self-pay

## 2022-10-06 ENCOUNTER — Encounter: Payer: Self-pay | Admitting: *Deleted

## 2022-10-06 DIAGNOSIS — E1065 Type 1 diabetes mellitus with hyperglycemia: Secondary | ICD-10-CM | POA: Diagnosis not present

## 2022-10-10 ENCOUNTER — Other Ambulatory Visit (HOSPITAL_COMMUNITY): Payer: Self-pay
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
## 2022-10-24 ENCOUNTER — Other Ambulatory Visit: Payer: Self-pay | Admitting: Obstetrics & Gynecology

## 2022-10-24 ENCOUNTER — Other Ambulatory Visit (HOSPITAL_COMMUNITY): Payer: Self-pay

## 2022-10-27 ENCOUNTER — Ambulatory Visit (INDEPENDENT_AMBULATORY_CARE_PROVIDER_SITE_OTHER): Payer: Self-pay | Admitting: Psychiatry

## 2022-10-27 DIAGNOSIS — Z91199 Patient's noncompliance with other medical treatment and regimen due to unspecified reason: Secondary | ICD-10-CM

## 2022-10-28 ENCOUNTER — Other Ambulatory Visit: Payer: Self-pay

## 2022-10-29 ENCOUNTER — Other Ambulatory Visit: Payer: Self-pay

## 2022-10-29 ENCOUNTER — Other Ambulatory Visit (HOSPITAL_COMMUNITY): Payer: Self-pay

## 2022-11-05 DIAGNOSIS — E1065 Type 1 diabetes mellitus with hyperglycemia: Secondary | ICD-10-CM | POA: Diagnosis not present

## 2022-11-05 DIAGNOSIS — Z794 Long term (current) use of insulin: Secondary | ICD-10-CM | POA: Diagnosis not present

## 2022-11-05 DIAGNOSIS — E109 Type 1 diabetes mellitus without complications: Secondary | ICD-10-CM | POA: Diagnosis not present

## 2022-11-18 ENCOUNTER — Telehealth: Payer: Self-pay | Admitting: Psychiatry

## 2022-11-18 ENCOUNTER — Other Ambulatory Visit (HOSPITAL_COMMUNITY): Payer: Self-pay

## 2022-11-18 ENCOUNTER — Other Ambulatory Visit: Payer: Self-pay | Admitting: Psychiatry

## 2022-11-18 NOTE — Telephone Encounter (Signed)
Patient has no showed for 4 appointments this year. She left voice message on 11/18/22 at 7:20 am that she has missed appointments probably due to her phone. Asking a return call to [phone]. Returned the call and her mail box is full. Sent a mychart message letting her know to call the office to schedule appointment and remind her of the No Show Policy

## 2022-11-19 ENCOUNTER — Other Ambulatory Visit (HOSPITAL_COMMUNITY): Payer: Self-pay

## 2022-11-19 ENCOUNTER — Other Ambulatory Visit: Payer: Self-pay

## 2022-11-19 MED ORDER — GVOKE HYPOPEN 2-PACK 1 MG/0.2ML ~~LOC~~ SOAJ
1.0000 mg | SUBCUTANEOUS | 1 refills | Status: AC | PRN
  Filled 2022-11-19: qty 0.4, 1d supply, fill #0
  Filled 2023-01-21: qty 0.4, 7d supply, fill #1

## 2022-11-21 ENCOUNTER — Other Ambulatory Visit (HOSPITAL_COMMUNITY): Payer: Self-pay

## 2022-11-21 ENCOUNTER — Other Ambulatory Visit: Payer: Self-pay

## 2022-11-21 MED ORDER — BUPROPION HCL ER (XL) 150 MG PO TB24
150.0000 mg | ORAL_TABLET | Freq: Every day | ORAL | 0 refills | Status: DC
  Filled 2022-11-21: qty 30, 30d supply, fill #0

## 2022-11-26 DIAGNOSIS — E1065 Type 1 diabetes mellitus with hyperglycemia: Secondary | ICD-10-CM | POA: Diagnosis not present

## 2022-11-26 DIAGNOSIS — E109 Type 1 diabetes mellitus without complications: Secondary | ICD-10-CM | POA: Diagnosis not present

## 2022-11-26 DIAGNOSIS — Z794 Long term (current) use of insulin: Secondary | ICD-10-CM | POA: Diagnosis not present

## 2022-11-27 DIAGNOSIS — Z720 Tobacco use: Secondary | ICD-10-CM | POA: Diagnosis not present

## 2022-11-27 DIAGNOSIS — G8911 Acute pain due to trauma: Secondary | ICD-10-CM | POA: Diagnosis not present

## 2022-11-27 DIAGNOSIS — E119 Type 2 diabetes mellitus without complications: Secondary | ICD-10-CM | POA: Diagnosis not present

## 2022-11-27 DIAGNOSIS — Z794 Long term (current) use of insulin: Secondary | ICD-10-CM | POA: Diagnosis not present

## 2022-11-27 DIAGNOSIS — M654 Radial styloid tenosynovitis [de Quervain]: Secondary | ICD-10-CM | POA: Diagnosis not present

## 2022-11-27 DIAGNOSIS — Z886 Allergy status to analgesic agent status: Secondary | ICD-10-CM | POA: Diagnosis not present

## 2022-11-27 DIAGNOSIS — Z9181 History of falling: Secondary | ICD-10-CM | POA: Diagnosis not present

## 2022-11-27 DIAGNOSIS — Z043 Encounter for examination and observation following other accident: Secondary | ICD-10-CM | POA: Diagnosis not present

## 2022-12-08 ENCOUNTER — Other Ambulatory Visit (HOSPITAL_COMMUNITY): Payer: Self-pay

## 2022-12-15 ENCOUNTER — Other Ambulatory Visit: Payer: Self-pay | Admitting: Obstetrics & Gynecology

## 2022-12-15 ENCOUNTER — Other Ambulatory Visit: Payer: Self-pay | Admitting: Psychiatry

## 2022-12-15 ENCOUNTER — Other Ambulatory Visit (HOSPITAL_COMMUNITY): Payer: Self-pay

## 2022-12-15 MED ORDER — BUSPIRONE HCL 15 MG PO TABS
15.0000 mg | ORAL_TABLET | Freq: Three times a day (TID) | ORAL | 0 refills | Status: DC
  Filled 2022-12-15: qty 90, 30d supply, fill #0

## 2022-12-15 MED ORDER — PRAZOSIN HCL 2 MG PO CAPS
2.0000 mg | ORAL_CAPSULE | Freq: Every day | ORAL | 0 refills | Status: DC
  Filled 2022-12-15: qty 30, 30d supply, fill #0

## 2022-12-15 MED ORDER — BUPROPION HCL ER (XL) 150 MG PO TB24
150.0000 mg | ORAL_TABLET | Freq: Every day | ORAL | 0 refills | Status: DC
  Filled 2022-12-15: qty 30, 30d supply, fill #0

## 2022-12-16 ENCOUNTER — Other Ambulatory Visit: Payer: Self-pay

## 2022-12-16 ENCOUNTER — Other Ambulatory Visit (HOSPITAL_COMMUNITY): Payer: Self-pay

## 2022-12-16 ENCOUNTER — Telehealth: Payer: Self-pay

## 2022-12-16 MED ORDER — GVOKE HYPOPEN 2-PACK 1 MG/0.2ML ~~LOC~~ SOAJ
1.0000 mg | SUBCUTANEOUS | 1 refills | Status: DC | PRN
  Filled 2022-12-16: qty 0.4, 1d supply, fill #0
  Filled 2023-01-14: qty 0.4, 1d supply, fill #1

## 2022-12-16 NOTE — Telephone Encounter (Signed)
Patient called nurse line stating that she was still having issues regarding her period.   Maureen Ralphs RN on 12/16/22 at [phone]

## 2022-12-17 NOTE — Telephone Encounter (Signed)
Called patient back regarding her period while on the nexplanon. Patient informed she has had irregular periods since being on the nexplanon and would to get the nexplanon taken out and discuss other contraception options. Informed patient that our front office can schedule an appointment for her and will give her a call once appointment is made. Patient voiced understanding and denies any other concerns at the moment.   Marcelino Duster, RN

## 2022-12-25 ENCOUNTER — Other Ambulatory Visit (HOSPITAL_COMMUNITY): Payer: Self-pay

## 2022-12-25 ENCOUNTER — Other Ambulatory Visit: Payer: Self-pay
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
## 2023-01-13 ENCOUNTER — Other Ambulatory Visit (HOSPITAL_COMMUNITY): Payer: Self-pay

## 2023-01-13 ENCOUNTER — Encounter: Payer: Self-pay | Admitting: Psychiatry

## 2023-01-13 ENCOUNTER — Other Ambulatory Visit: Payer: Self-pay

## 2023-01-13 ENCOUNTER — Ambulatory Visit (INDEPENDENT_AMBULATORY_CARE_PROVIDER_SITE_OTHER): Payer: Commercial Managed Care - PPO | Admitting: Psychiatry

## 2023-01-13 ENCOUNTER — Other Ambulatory Visit: Payer: Self-pay | Admitting: Psychiatry

## 2023-01-13 VITALS — BP 109/73 | HR 90 | Temp 96.5°F | Ht 68.0 in | Wt 314.8 lb

## 2023-01-13 DIAGNOSIS — F330 Major depressive disorder, recurrent, mild: Secondary | ICD-10-CM

## 2023-01-13 DIAGNOSIS — F4310 Post-traumatic stress disorder, unspecified: Secondary | ICD-10-CM

## 2023-01-13 MED ORDER — PRAZOSIN HCL 2 MG PO CAPS
2.0000 mg | ORAL_CAPSULE | Freq: Every day | ORAL | 4 refills | Status: AC
  Filled 2023-01-13: qty 30, 30d supply, fill #0
  Filled 2023-02-14: qty 30, 30d supply, fill #1
  Filled 2023-03-26: qty 30, 30d supply, fill #2
  Filled 2023-05-29 – 2023-06-08 (×2): qty 30, 30d supply, fill #3
  Filled 2023-07-08: qty 30, 30d supply, fill #4

## 2023-01-13 MED ORDER — BUPROPION HCL ER (XL) 150 MG PO TB24
150.0000 mg | ORAL_TABLET | Freq: Every day | ORAL | 1 refills | Status: DC
  Filled 2023-01-13: qty 30, 30d supply, fill #0
  Filled 2023-02-14: qty 30, 30d supply, fill #1

## 2023-01-13 MED ORDER — BUSPIRONE HCL 15 MG PO TABS
15.0000 mg | ORAL_TABLET | Freq: Three times a day (TID) | ORAL | 1 refills | Status: DC
  Filled 2023-01-13: qty 90, 30d supply, fill #0
  Filled 2023-02-14: qty 90, 30d supply, fill #1

## 2023-01-13 NOTE — Patient Instructions (Signed)
Continue sertraline 200 mg daily Continue bupropion 450 mg daily Continue Buspar 10 mg three times a day Continue prazosin 2 mg at night  Next appointment- 1/8 at 3 pm

## 2023-01-14 ENCOUNTER — Encounter: Payer: Self-pay | Admitting: Obstetrics and Gynecology

## 2023-01-14 ENCOUNTER — Ambulatory Visit: Payer: Commercial Managed Care - PPO | Admitting: Obstetrics and Gynecology

## 2023-01-14 ENCOUNTER — Other Ambulatory Visit: Payer: Self-pay

## 2023-01-14 ENCOUNTER — Other Ambulatory Visit (HOSPITAL_COMMUNITY): Payer: Self-pay

## 2023-01-14 VITALS — BP 132/66 | HR 80 | Ht 68.0 in | Wt 312.1 lb

## 2023-01-14 DIAGNOSIS — Z1330 Encounter for screening examination for mental health and behavioral disorders, unspecified: Secondary | ICD-10-CM

## 2023-01-14 DIAGNOSIS — Z3046 Encounter for surveillance of implantable subdermal contraceptive: Secondary | ICD-10-CM

## 2023-01-14 DIAGNOSIS — Z975 Presence of (intrauterine) contraceptive device: Secondary | ICD-10-CM

## 2023-01-14 DIAGNOSIS — N921 Excessive and frequent menstruation with irregular cycle: Secondary | ICD-10-CM

## 2023-01-14 HISTORY — PX: REMOVAL OF IMPLANON ROD: OBO 1006

## 2023-01-14 MED ORDER — IBUPROFEN 800 MG PO TABS
800.0000 mg | ORAL_TABLET | Freq: Once | ORAL | Status: AC
  Administered 2023-01-14: 800 mg via ORAL

## 2023-01-14 MED ORDER — PHEXXI 1.8-1-0.4 % VA GEL
1.0000 | VAGINAL | 3 refills | Status: AC | PRN
  Filled 2023-01-14: qty 60, 12d supply, fill #0
  Filled 2023-01-21: qty 60, 30d supply, fill #0

## 2023-01-14 NOTE — Progress Notes (Signed)
Obstetrics and Gynecology Visit Return Patient Evaluation  Appointment Date: 01/14/2023  Primary Care Provider: Pcp, No  OBGYN Clinic: [facility]  Chief Complaint: AUB with nexplanon  History of Present Illness:  Lydia Estrada is a 22 y.o. s/p 3/12 nexplanon placement after 3/8 c/s.   Interval History: Since that time, she states that she's had longer periods lasting close to two weeks; she is not breastfeeding. Prior to pregnancy, her periods were qmonth, regular, approximately one week  Review of Systems:  as noted in the History of Present Illness.  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 Current Outpatient Medications  Medication Sig Dispense Refill   Lactic Ac-Citric Ac-Pot Bitart (PHEXXI) 1.8-1-0.4 % GEL Place 1 applicator vaginally as needed. 50 g 3   aspirin EC (ASPIRIN 81) 81 MG tablet Take 1 tablet (81 mg total) by mouth daily. 30 tablet 11   buPROPion (WELLBUTRIN XL) 150 MG 24 hr tablet Take 1 tablet (150 mg total) by mouth daily. Must make appointment for refills 30 tablet 1   busPIRone (BUSPAR) 15 MG tablet Take 1 tablet (15 mg total) by mouth 3  (three) times daily. 90 tablet 1   EPINEPHrine 0.3 mg/0.3 mL IJ SOAJ injection See admin instructions.     Glucagon (BAQSIMI TWO PACK) 3 MG/DOSE POWD Place 1 spray into one nostril once for 1 dose.  May repeat 1 spray in 15 minutes if inadequate response. (Patient not taking: Reported on 05/20/2022) 2 each 2   Glucagon (GVOKE HYPOPEN 1-PACK) 1 MG/0.2ML SOAJ inject 1 mg by subcutaneous route once in the abdomen, thigh, or upper arm may repeat in 15 minutes if inadequate response 0.2 mL 2   Glucagon (GVOKE HYPOPEN 2-PACK) 1 MG/0.2ML SOAJ Use as directed under the skin for hypoglycemia 0.4 mL 1   Glucagon (GVOKE HYPOPEN 2-PACK) 1 MG/0.2ML SOAJ Use as directed for hypoglycemia 0.4 mL 1   glucose blood (FREESTYLE LITE) test strip See admin instructions.     hydrOXYzine (ATARAX) 25 MG tablet 1 tablet as needed     imiquimod (ALDARA) 5 % cream Apply topically 3 (three) times a week. 12 each 0   insulin degludec (TRESIBA FLEXTOUCH) 200 UNIT/ML FlexTouch Pen Inject 60 Units into the skin in the morning and at bedtime. Resume after discussion with Endocrinology 18 mL 5   Insulin Disposable Pump (OMNIPOD 5 DEXG7G6 PODS GEN 5) MISC Change 1 pod onto the skin every other day. 15 each 5   insulin lispro (HUMALOG KWIKPEN) 100 UNIT/ML KwikPen Inject 150 Units into the skin daily. Carb counting sliding scale - resume after consultation with endocrinology 70-120 - 0 units 121-150 - 1 unit 151-200 - 2 units 201-250 - 3 units 251-300 - 5 units 301-350 - 7 units 351-400 - 9 units  69 mL 2   insulin lispro (HUMALOG) 100 UNIT/ML injection Inject 1 mL (100 Units total) daily via pump. 30 mL 5   insulin lispro (HUMALOG) 100 UNIT/ML injection Inject 1 mL (100 Units total) into the skin daily via pump. 30 mL 5   medroxyPROGESTERone (PROVERA) 10 MG tablet Take 1 tablet (10 mg total) by mouth daily. 30 tablet 0   metFORMIN (GLUCOPHAGE-XR) 500 MG 24 hr tablet Take 3 tablets (1,500 mg total) by mouth daily with breakfast.  Resume after consultation with endocrinology 270 tablet 1   oxyCODONE (OXY IR/ROXICODONE) 5 MG immediate release tablet Take 1-2 tablets (5-10 mg total) by mouth every 4 (four) hours as needed for moderate pain. 20 tablet 0   prazosin (MINIPRESS) 2 MG capsule Take 1 capsule (2 mg total) by mouth at bedtime. 30 capsule 4   Prenatal Vit-Fe Fumarate-FA (PRENATAL PO) Take 1 tablet by mouth daily.     promethazine (PHENERGAN) 25 MG tablet Take 1 tablet (25 mg total) by mouth every 6 (six) hours as needed for nausea or vomiting. 30 tablet 1   sertraline (ZOLOFT) 100 MG tablet Take 2 tablets (200 mg total) by mouth daily. 60 tablet 5   valACYclovir (VALTREX) 500 MG tablet Take 1 tablet (500 mg total) by mouth 2 (two) times daily. 60 tablet 1   No current facility-administered medications for this visit.    Allergies: is allergic to bee venom, pomegranate (punica granatum), grapefruit concentrate, ibuprofen, naproxen, and other.  Physical Exam:  BP 132/66   Pulse 80   Ht 5\' 8"  (1.727 m)   Wt (!) 312 lb 1.6 oz (141.6 kg)   BMI 47.45 kg/m  Body mass index is 47.45 kg/m. General appearance: Well nourished, well developed female in no acute distress.  Abdomen: diffusely non tender to palpation, non distended, and no masses, hernias Neuro/Psych:  Normal mood and affect.    See procedure note for nexplanon removal   Assessment: patient doing well  Plan:  1. Breakthrough bleeding on Nexplanon Patient desires nexplanon removal and to be hormone free. Options d/w her and she would still like removal which was done today. Patient amenable to phexxi in the interim. Declines pap today   RTC: 2 months for annual, f/u AUB, pap smear.   Future Appointments  Date Time Provider Department Center  03/11/2023  3:00 PM Neysa Hotter, MD [facility] None    Cornelia Copa MD Attending [facility])

## 2023-01-14 NOTE — Progress Notes (Signed)
Contacted patient via phone call to verify allergy, sent to voicemail.  Lydia Duster, RN

## 2023-01-14 NOTE — Procedures (Signed)
Nexplanon Removal Procedure Note After informed consent was obtained, the patient's left arm was examined and the Nexplanon rod was noted to be mild to moderately palpable. The area was cleaned with alcohol then local anesthesia was infiltrated with 3 ml of 1% lidocaine with epinephrine. The area was prepped with betadine. Using sterile technique, an 11 blade was used to make an incision, and the Nexplanon device was brought to the incision site. The capsule was scrapped off with the scalpel, the Nexplanon grasped with hemostats, and easily removed; the removal site was hemostatic. The Nexplanon was inspected and noted to be intact.  A steri-strip and a pressure dressing was applied.  The patient tolerated the procedure well.  She chose to do Phexxi for birth control.   Cornelia Copa MD Attending Center for [facility])

## 2023-01-15 ENCOUNTER — Other Ambulatory Visit: Payer: Self-pay

## 2023-01-21 ENCOUNTER — Other Ambulatory Visit (HOSPITAL_COMMUNITY): Payer: Self-pay

## 2023-01-21 ENCOUNTER — Other Ambulatory Visit: Payer: Self-pay

## 2023-01-22 ENCOUNTER — Other Ambulatory Visit: Payer: Self-pay

## 2023-01-27 ENCOUNTER — Other Ambulatory Visit (HOSPITAL_COMMUNITY): Payer: Self-pay

## 2023-01-27 DIAGNOSIS — E109 Type 1 diabetes mellitus without complications: Secondary | ICD-10-CM | POA: Diagnosis not present

## 2023-01-27 DIAGNOSIS — E1065 Type 1 diabetes mellitus with hyperglycemia: Secondary | ICD-10-CM | POA: Diagnosis not present

## 2023-01-27 DIAGNOSIS — Z794 Long term (current) use of insulin: Secondary | ICD-10-CM | POA: Diagnosis not present

## 2023-02-09 ENCOUNTER — Other Ambulatory Visit (HOSPITAL_COMMUNITY): Payer: Self-pay

## 2023-02-14 ENCOUNTER — Other Ambulatory Visit: Payer: Self-pay | Admitting: Family Medicine

## 2023-02-14 ENCOUNTER — Other Ambulatory Visit: Payer: Self-pay | Admitting: Obstetrics & Gynecology

## 2023-02-14 ENCOUNTER — Other Ambulatory Visit (HOSPITAL_COMMUNITY): Payer: Self-pay

## 2023-02-16 ENCOUNTER — Other Ambulatory Visit: Payer: Self-pay

## 2023-02-19 ENCOUNTER — Other Ambulatory Visit (HOSPITAL_COMMUNITY): Payer: Self-pay

## 2023-02-19 MED ORDER — INSULIN LISPRO 100 UNIT/ML IJ SOLN
INTRAMUSCULAR | 1 refills | Status: DC
  Filled 2023-02-19: qty 30, 30d supply, fill #0
  Filled 2023-03-26 – 2023-08-04 (×7): qty 30, 30d supply, fill #1

## 2023-02-20 DIAGNOSIS — E109 Type 1 diabetes mellitus without complications: Secondary | ICD-10-CM | POA: Diagnosis not present

## 2023-02-20 DIAGNOSIS — E1065 Type 1 diabetes mellitus with hyperglycemia: Secondary | ICD-10-CM | POA: Diagnosis not present

## 2023-02-20 DIAGNOSIS — Z794 Long term (current) use of insulin: Secondary | ICD-10-CM | POA: Diagnosis not present

## 2023-03-05 ENCOUNTER — Other Ambulatory Visit (HOSPITAL_COMMUNITY): Payer: Self-pay

## 2023-03-05 ENCOUNTER — Other Ambulatory Visit: Payer: Self-pay

## 2023-03-08 NOTE — Progress Notes (Signed)
Sent a video visit link through Epic, but the patient didn't sign in. Tried calling for today's appointment, but got no answer. Left a voicemail instructing the patient to contact the office at [phone].  

## 2023-03-11 ENCOUNTER — Other Ambulatory Visit: Payer: Self-pay | Admitting: Psychiatry

## 2023-03-11 ENCOUNTER — Other Ambulatory Visit (HOSPITAL_COMMUNITY): Payer: Self-pay

## 2023-03-11 ENCOUNTER — Telehealth (INDEPENDENT_AMBULATORY_CARE_PROVIDER_SITE_OTHER): Payer: Self-pay | Admitting: Psychiatry

## 2023-03-11 ENCOUNTER — Other Ambulatory Visit: Payer: Self-pay | Admitting: Family Medicine

## 2023-03-11 DIAGNOSIS — E1065 Type 1 diabetes mellitus with hyperglycemia: Secondary | ICD-10-CM | POA: Diagnosis not present

## 2023-03-11 DIAGNOSIS — O24019 Pre-existing diabetes mellitus, type 1, in pregnancy, unspecified trimester: Secondary | ICD-10-CM

## 2023-03-11 DIAGNOSIS — Z91199 Patient's noncompliance with other medical treatment and regimen due to unspecified reason: Secondary | ICD-10-CM

## 2023-03-11 MED ORDER — OMNIPOD 5 DEXG7G6 PODS GEN 5 MISC
5 refills | Status: DC
  Filled 2023-03-11: qty 15, 30d supply, fill #0
  Filled 2023-04-12: qty 15, 30d supply, fill #1
  Filled 2023-05-11 (×2): qty 15, 30d supply, fill #2
  Filled 2023-06-08: qty 15, 30d supply, fill #3
  Filled 2023-07-08: qty 15, 30d supply, fill #4
  Filled 2023-08-05: qty 15, 30d supply, fill #5

## 2023-03-12 ENCOUNTER — Other Ambulatory Visit: Payer: Self-pay | Admitting: Psychiatry

## 2023-03-12 ENCOUNTER — Telehealth: Payer: Self-pay | Admitting: Psychiatry

## 2023-03-12 ENCOUNTER — Encounter: Payer: Self-pay | Admitting: Psychiatry

## 2023-03-12 ENCOUNTER — Other Ambulatory Visit: Payer: Self-pay

## 2023-03-12 ENCOUNTER — Other Ambulatory Visit (HOSPITAL_COMMUNITY): Payer: Self-pay

## 2023-03-12 MED ORDER — BUPROPION HCL ER (XL) 150 MG PO TB24
150.0000 mg | ORAL_TABLET | Freq: Every day | ORAL | 2 refills | Status: AC
  Filled 2023-03-12: qty 30, 30d supply, fill #0
  Filled 2023-04-12: qty 30, 30d supply, fill #1
  Filled 2023-05-11: qty 30, 30d supply, fill #2

## 2023-03-12 MED ORDER — SERTRALINE HCL 100 MG PO TABS
200.0000 mg | ORAL_TABLET | Freq: Every day | ORAL | 2 refills | Status: AC
  Filled 2023-03-12: qty 60, 30d supply, fill #0
  Filled 2023-04-12: qty 60, 30d supply, fill #1
  Filled 2023-05-11: qty 60, 30d supply, fill #2

## 2023-03-12 MED ORDER — BUSPIRONE HCL 15 MG PO TABS
15.0000 mg | ORAL_TABLET | Freq: Three times a day (TID) | ORAL | 2 refills | Status: AC
  Filled 2023-03-12: qty 90, 30d supply, fill #0
  Filled 2023-04-12: qty 90, 30d supply, fill #1
  Filled 2023-05-11: qty 90, 30d supply, fill #2

## 2023-03-12 NOTE — Telephone Encounter (Signed)
 Patient no showed for appt. Left voice message at 4:52 pm. She stated that she called earlier in the day and now calling back to reschedule due to double booking herself at same time.  Spoke to patient 03-12-23, she will be dismissed from the practice due to multiple no show appointments. Agreed to take her back as patient in Sept 2024 and reviewed the attendance policy, she showed for the Nov. Appointment. She was upset and stated she didn't know what to do when she calls and no one answers the phone, advised her that she needs to leave a message especially if she knew she was cancelling the appointment the day of or call the day before, but she has to leave a message and not after the missed appointment. Advised her to locate a psychiatrist in her area.

## 2023-03-13 ENCOUNTER — Other Ambulatory Visit: Payer: Self-pay

## 2023-03-13 ENCOUNTER — Other Ambulatory Visit (HOSPITAL_COMMUNITY): Payer: Self-pay

## 2023-03-14 ENCOUNTER — Other Ambulatory Visit (HOSPITAL_COMMUNITY): Payer: Self-pay

## 2023-03-14 MED ORDER — GVOKE HYPOPEN 2-PACK 1 MG/0.2ML ~~LOC~~ SOAJ
1.0000 mg | SUBCUTANEOUS | 1 refills | Status: AC | PRN
  Filled 2023-03-14 – 2023-07-08 (×3): qty 0.4, 7d supply, fill #0

## 2023-03-16 ENCOUNTER — Other Ambulatory Visit: Payer: Self-pay

## 2023-03-16 ENCOUNTER — Other Ambulatory Visit (HOSPITAL_COMMUNITY): Payer: Self-pay

## 2023-03-18 ENCOUNTER — Encounter (INDEPENDENT_AMBULATORY_CARE_PROVIDER_SITE_OTHER): Payer: Self-pay

## 2023-03-18 ENCOUNTER — Other Ambulatory Visit: Payer: Self-pay

## 2023-03-26 ENCOUNTER — Other Ambulatory Visit (HOSPITAL_COMMUNITY): Payer: Self-pay

## 2023-03-26 ENCOUNTER — Other Ambulatory Visit: Payer: Self-pay

## 2023-04-12 ENCOUNTER — Other Ambulatory Visit (HOSPITAL_COMMUNITY): Payer: Self-pay

## 2023-04-12 ENCOUNTER — Other Ambulatory Visit: Payer: Self-pay

## 2023-04-13 ENCOUNTER — Other Ambulatory Visit (HOSPITAL_COMMUNITY): Payer: Self-pay

## 2023-04-13 ENCOUNTER — Other Ambulatory Visit: Payer: Self-pay

## 2023-04-21 DIAGNOSIS — E1065 Type 1 diabetes mellitus with hyperglycemia: Secondary | ICD-10-CM | POA: Diagnosis not present

## 2023-04-21 DIAGNOSIS — E109 Type 1 diabetes mellitus without complications: Secondary | ICD-10-CM | POA: Diagnosis not present

## 2023-04-21 DIAGNOSIS — Z794 Long term (current) use of insulin: Secondary | ICD-10-CM | POA: Diagnosis not present

## 2023-04-23 ENCOUNTER — Other Ambulatory Visit (HOSPITAL_COMMUNITY): Payer: Self-pay

## 2023-05-11 ENCOUNTER — Other Ambulatory Visit (HOSPITAL_COMMUNITY): Payer: Self-pay

## 2023-05-12 ENCOUNTER — Other Ambulatory Visit (HOSPITAL_COMMUNITY): Payer: Self-pay

## 2023-05-15 DIAGNOSIS — E109 Type 1 diabetes mellitus without complications: Secondary | ICD-10-CM | POA: Diagnosis not present

## 2023-05-15 DIAGNOSIS — Z794 Long term (current) use of insulin: Secondary | ICD-10-CM | POA: Diagnosis not present

## 2023-05-15 DIAGNOSIS — E1065 Type 1 diabetes mellitus with hyperglycemia: Secondary | ICD-10-CM | POA: Diagnosis not present

## 2023-05-29 ENCOUNTER — Other Ambulatory Visit: Payer: Self-pay | Admitting: Family Medicine

## 2023-05-29 DIAGNOSIS — O24019 Pre-existing diabetes mellitus, type 1, in pregnancy, unspecified trimester: Secondary | ICD-10-CM

## 2023-05-30 ENCOUNTER — Other Ambulatory Visit (HOSPITAL_COMMUNITY): Payer: Self-pay

## 2023-06-01 ENCOUNTER — Other Ambulatory Visit: Payer: Self-pay

## 2023-06-01 ENCOUNTER — Other Ambulatory Visit (HOSPITAL_COMMUNITY): Payer: Self-pay

## 2023-06-02 ENCOUNTER — Other Ambulatory Visit: Payer: Self-pay

## 2023-06-08 ENCOUNTER — Other Ambulatory Visit (HOSPITAL_COMMUNITY): Payer: Self-pay

## 2023-06-08 ENCOUNTER — Other Ambulatory Visit: Payer: Self-pay

## 2023-06-09 ENCOUNTER — Encounter (INDEPENDENT_AMBULATORY_CARE_PROVIDER_SITE_OTHER): Payer: Self-pay

## 2023-06-09 DIAGNOSIS — E1065 Type 1 diabetes mellitus with hyperglycemia: Secondary | ICD-10-CM | POA: Diagnosis not present

## 2023-06-17 ENCOUNTER — Other Ambulatory Visit (HOSPITAL_COMMUNITY): Payer: Self-pay

## 2023-06-17 ENCOUNTER — Other Ambulatory Visit: Payer: Self-pay

## 2023-06-17 DIAGNOSIS — E1065 Type 1 diabetes mellitus with hyperglycemia: Secondary | ICD-10-CM | POA: Diagnosis not present

## 2023-06-17 MED ORDER — GVOKE HYPOPEN 2-PACK 1 MG/0.2ML ~~LOC~~ SOAJ
1.0000 mg | SUBCUTANEOUS | 3 refills | Status: DC
  Filled 2023-06-17: qty 0.4, 2d supply, fill #0
  Filled 2023-07-08: qty 0.4, fill #0
  Filled 2023-07-23: qty 0.4, 30d supply, fill #0
  Filled 2023-09-02: qty 0.4, 30d supply, fill #1
  Filled 2023-10-03: qty 0.4, 30d supply, fill #2
  Filled 2023-10-30: qty 0.4, 30d supply, fill #3

## 2023-06-18 ENCOUNTER — Other Ambulatory Visit: Payer: Self-pay

## 2023-06-22 ENCOUNTER — Encounter (INDEPENDENT_AMBULATORY_CARE_PROVIDER_SITE_OTHER): Payer: Self-pay

## 2023-06-26 ENCOUNTER — Other Ambulatory Visit (HOSPITAL_COMMUNITY): Payer: Self-pay

## 2023-06-29 ENCOUNTER — Other Ambulatory Visit: Payer: Self-pay | Admitting: Family Medicine

## 2023-06-29 ENCOUNTER — Other Ambulatory Visit (HOSPITAL_COMMUNITY): Payer: Self-pay

## 2023-06-29 DIAGNOSIS — O24019 Pre-existing diabetes mellitus, type 1, in pregnancy, unspecified trimester: Secondary | ICD-10-CM

## 2023-06-30 ENCOUNTER — Other Ambulatory Visit (HOSPITAL_COMMUNITY): Payer: Self-pay

## 2023-07-02 ENCOUNTER — Other Ambulatory Visit (HOSPITAL_COMMUNITY): Payer: Self-pay

## 2023-07-08 ENCOUNTER — Other Ambulatory Visit: Payer: Self-pay | Admitting: Family Medicine

## 2023-07-08 DIAGNOSIS — O24019 Pre-existing diabetes mellitus, type 1, in pregnancy, unspecified trimester: Secondary | ICD-10-CM

## 2023-07-09 ENCOUNTER — Other Ambulatory Visit (HOSPITAL_COMMUNITY): Payer: Self-pay

## 2023-07-09 ENCOUNTER — Other Ambulatory Visit: Payer: Self-pay

## 2023-07-10 ENCOUNTER — Other Ambulatory Visit (HOSPITAL_COMMUNITY): Payer: Self-pay

## 2023-07-15 ENCOUNTER — Other Ambulatory Visit: Payer: Self-pay | Admitting: Family Medicine

## 2023-07-15 ENCOUNTER — Other Ambulatory Visit (HOSPITAL_COMMUNITY): Payer: Self-pay

## 2023-07-15 DIAGNOSIS — E109 Type 1 diabetes mellitus without complications: Secondary | ICD-10-CM | POA: Diagnosis not present

## 2023-07-15 DIAGNOSIS — O24019 Pre-existing diabetes mellitus, type 1, in pregnancy, unspecified trimester: Secondary | ICD-10-CM

## 2023-07-15 DIAGNOSIS — Z794 Long term (current) use of insulin: Secondary | ICD-10-CM | POA: Diagnosis not present

## 2023-07-15 DIAGNOSIS — E1065 Type 1 diabetes mellitus with hyperglycemia: Secondary | ICD-10-CM | POA: Diagnosis not present

## 2023-07-17 ENCOUNTER — Other Ambulatory Visit: Payer: Self-pay

## 2023-07-17 ENCOUNTER — Other Ambulatory Visit (HOSPITAL_COMMUNITY): Payer: Self-pay

## 2023-07-17 MED ORDER — METFORMIN HCL ER 500 MG PO TB24
1500.0000 mg | ORAL_TABLET | Freq: Every day | ORAL | 3 refills | Status: AC
Start: 2023-07-16 — End: ?
  Filled 2023-07-17: qty 270, 90d supply, fill #0
  Filled 2023-10-14 (×2): qty 270, 90d supply, fill #1
  Filled 2024-01-15: qty 270, 90d supply, fill #2

## 2023-07-23 ENCOUNTER — Other Ambulatory Visit (HOSPITAL_COMMUNITY): Payer: Self-pay

## 2023-07-23 ENCOUNTER — Other Ambulatory Visit: Payer: Self-pay

## 2023-08-04 ENCOUNTER — Other Ambulatory Visit: Payer: Self-pay | Admitting: Psychiatry

## 2023-08-04 ENCOUNTER — Other Ambulatory Visit (HOSPITAL_COMMUNITY): Payer: Self-pay

## 2023-08-05 ENCOUNTER — Other Ambulatory Visit: Payer: Self-pay

## 2023-08-05 ENCOUNTER — Other Ambulatory Visit (HOSPITAL_COMMUNITY): Payer: Self-pay

## 2023-08-05 ENCOUNTER — Other Ambulatory Visit: Payer: Self-pay | Admitting: Psychiatry

## 2023-08-07 ENCOUNTER — Other Ambulatory Visit (HOSPITAL_COMMUNITY): Payer: Self-pay

## 2023-08-07 ENCOUNTER — Other Ambulatory Visit (HOSPITAL_BASED_OUTPATIENT_CLINIC_OR_DEPARTMENT_OTHER): Payer: Self-pay

## 2023-08-07 MED ORDER — INSULIN LISPRO 100 UNIT/ML IJ SOLN
100.0000 [IU] | Freq: Every day | INTRAMUSCULAR | 5 refills | Status: AC
  Filled 2023-08-07 – 2023-09-02 (×3): qty 30, 30d supply, fill #0
  Filled 2023-10-03: qty 30, 30d supply, fill #1
  Filled 2023-11-16: qty 30, 30d supply, fill #2

## 2023-08-11 ENCOUNTER — Other Ambulatory Visit (HOSPITAL_COMMUNITY): Payer: Self-pay

## 2023-08-12 ENCOUNTER — Other Ambulatory Visit (HOSPITAL_COMMUNITY): Payer: Self-pay

## 2023-08-12 ENCOUNTER — Other Ambulatory Visit: Payer: Self-pay | Admitting: Psychiatry

## 2023-08-13 DIAGNOSIS — E109 Type 1 diabetes mellitus without complications: Secondary | ICD-10-CM | POA: Diagnosis not present

## 2023-08-13 DIAGNOSIS — Z794 Long term (current) use of insulin: Secondary | ICD-10-CM | POA: Diagnosis not present

## 2023-08-13 DIAGNOSIS — E1065 Type 1 diabetes mellitus with hyperglycemia: Secondary | ICD-10-CM | POA: Diagnosis not present

## 2023-08-18 ENCOUNTER — Other Ambulatory Visit (HOSPITAL_COMMUNITY): Payer: Self-pay

## 2023-08-18 ENCOUNTER — Other Ambulatory Visit: Payer: Self-pay

## 2023-08-20 ENCOUNTER — Other Ambulatory Visit (HOSPITAL_COMMUNITY): Payer: Self-pay

## 2023-08-20 MED ORDER — INSULIN LISPRO (1 UNIT DIAL) 100 UNIT/ML (KWIKPEN)
20.0000 [IU] | PEN_INJECTOR | Freq: Three times a day (TID) | SUBCUTANEOUS | 3 refills | Status: AC
  Filled 2023-08-20: qty 18, 30d supply, fill #0
  Filled 2023-10-03: qty 18, 30d supply, fill #1
  Filled 2023-11-16: qty 18, 30d supply, fill #2

## 2023-08-21 ENCOUNTER — Other Ambulatory Visit (HOSPITAL_COMMUNITY): Payer: Self-pay

## 2023-09-02 ENCOUNTER — Other Ambulatory Visit (HOSPITAL_COMMUNITY): Payer: Self-pay

## 2023-09-02 ENCOUNTER — Other Ambulatory Visit: Payer: Self-pay | Admitting: Psychiatry

## 2023-09-03 ENCOUNTER — Other Ambulatory Visit: Payer: Self-pay

## 2023-09-03 ENCOUNTER — Other Ambulatory Visit (HOSPITAL_COMMUNITY): Payer: Self-pay

## 2023-09-07 ENCOUNTER — Other Ambulatory Visit (HOSPITAL_COMMUNITY): Payer: Self-pay

## 2023-09-07 ENCOUNTER — Other Ambulatory Visit: Payer: Self-pay

## 2023-09-07 MED ORDER — OMNIPOD 5 DEXG7G6 PODS GEN 5 MISC
1.0000 | 5 refills | Status: DC
  Filled 2023-09-07: qty 15, 30d supply, fill #0
  Filled 2023-10-03: qty 15, 30d supply, fill #1
  Filled 2023-11-07: qty 15, 30d supply, fill #2
  Filled 2023-11-28 – 2023-12-04 (×2): qty 15, 30d supply, fill #3
  Filled 2024-01-05: qty 15, 30d supply, fill #4
  Filled 2024-02-04: qty 15, 30d supply, fill #5

## 2023-09-07 MED ORDER — INSULIN LISPRO 100 UNIT/ML IJ SOLN
100.0000 [IU] | Freq: Every day | INTRAMUSCULAR | 5 refills | Status: AC
  Filled 2023-09-07: qty 30, 30d supply, fill #0

## 2023-09-08 ENCOUNTER — Other Ambulatory Visit (HOSPITAL_COMMUNITY): Payer: Self-pay

## 2023-09-17 ENCOUNTER — Other Ambulatory Visit: Payer: Self-pay

## 2023-09-17 ENCOUNTER — Other Ambulatory Visit (HOSPITAL_COMMUNITY): Payer: Self-pay

## 2023-09-17 DIAGNOSIS — E1065 Type 1 diabetes mellitus with hyperglycemia: Secondary | ICD-10-CM | POA: Diagnosis not present

## 2023-09-17 MED ORDER — FIASP 100 UNIT/ML IJ SOLN
100.0000 [IU] | Freq: Every day | INTRAMUSCULAR | 3 refills | Status: AC
  Filled 2023-09-17 – 2023-10-30 (×6): qty 30, 30d supply, fill #0
  Filled 2023-11-23: qty 30, 30d supply, fill #1
  Filled 2023-12-17 – 2023-12-20 (×2): qty 30, 30d supply, fill #2
  Filled 2023-12-31 – 2024-02-13 (×4): qty 30, 30d supply, fill #3

## 2023-09-17 MED ORDER — OZEMPIC (0.25 OR 0.5 MG/DOSE) 2 MG/3ML ~~LOC~~ SOPN
0.2500 mg | PEN_INJECTOR | SUBCUTANEOUS | 3 refills | Status: DC
  Filled 2023-09-17: qty 3, 28d supply, fill #0
  Filled 2023-10-18: qty 3, 28d supply, fill #1

## 2023-09-17 MED ORDER — METFORMIN HCL ER 500 MG PO TB24
1500.0000 mg | ORAL_TABLET | Freq: Every day | ORAL | 3 refills | Status: AC
  Filled 2023-09-17: qty 270, 90d supply, fill #0

## 2023-09-18 ENCOUNTER — Other Ambulatory Visit (HOSPITAL_COMMUNITY): Payer: Self-pay

## 2023-09-21 ENCOUNTER — Other Ambulatory Visit (HOSPITAL_COMMUNITY): Payer: Self-pay

## 2023-09-24 ENCOUNTER — Other Ambulatory Visit (HOSPITAL_COMMUNITY): Payer: Self-pay

## 2023-09-24 DIAGNOSIS — O99211 Obesity complicating pregnancy, first trimester: Secondary | ICD-10-CM | POA: Diagnosis not present

## 2023-09-24 DIAGNOSIS — Z3201 Encounter for pregnancy test, result positive: Secondary | ICD-10-CM | POA: Diagnosis not present

## 2023-09-24 DIAGNOSIS — O24011 Pre-existing diabetes mellitus, type 1, in pregnancy, first trimester: Secondary | ICD-10-CM | POA: Diagnosis not present

## 2023-09-24 DIAGNOSIS — N926 Irregular menstruation, unspecified: Secondary | ICD-10-CM | POA: Diagnosis not present

## 2023-09-24 DIAGNOSIS — Z3A01 Less than 8 weeks gestation of pregnancy: Secondary | ICD-10-CM | POA: Diagnosis not present

## 2023-09-24 MED ORDER — CITRANATAL 90 DHA 90-1 & 300 MG PO MISC
1.0000 | Freq: Every day | ORAL | 2 refills | Status: AC
  Filled 2023-09-24: qty 120, 60d supply, fill #0
  Filled 2023-10-18: qty 60, 30d supply, fill #0
  Filled 2023-11-16: qty 60, 30d supply, fill #1
  Filled 2023-12-17: qty 60, 30d supply, fill #2
  Filled 2024-01-15: qty 60, 30d supply, fill #3
  Filled 2024-02-29: qty 60, 30d supply, fill #4
  Filled 2024-04-02: qty 60, 30d supply, fill #5

## 2023-09-25 ENCOUNTER — Other Ambulatory Visit (HOSPITAL_COMMUNITY): Payer: Self-pay

## 2023-09-25 ENCOUNTER — Other Ambulatory Visit (HOSPITAL_BASED_OUTPATIENT_CLINIC_OR_DEPARTMENT_OTHER): Payer: Self-pay

## 2023-09-25 ENCOUNTER — Other Ambulatory Visit: Payer: Self-pay

## 2023-09-28 ENCOUNTER — Other Ambulatory Visit: Payer: Self-pay

## 2023-09-30 ENCOUNTER — Other Ambulatory Visit: Payer: Self-pay

## 2023-10-03 ENCOUNTER — Other Ambulatory Visit: Payer: Self-pay | Admitting: Psychiatry

## 2023-10-05 ENCOUNTER — Other Ambulatory Visit (HOSPITAL_COMMUNITY): Payer: Self-pay

## 2023-10-05 ENCOUNTER — Other Ambulatory Visit: Payer: Self-pay

## 2023-10-05 DIAGNOSIS — Z3A01 Less than 8 weeks gestation of pregnancy: Secondary | ICD-10-CM | POA: Diagnosis not present

## 2023-10-05 DIAGNOSIS — O34219 Maternal care for unspecified type scar from previous cesarean delivery: Secondary | ICD-10-CM | POA: Diagnosis not present

## 2023-10-05 DIAGNOSIS — O24011 Pre-existing diabetes mellitus, type 1, in pregnancy, first trimester: Secondary | ICD-10-CM | POA: Diagnosis not present

## 2023-10-05 DIAGNOSIS — O99211 Obesity complicating pregnancy, first trimester: Secondary | ICD-10-CM | POA: Diagnosis not present

## 2023-10-07 ENCOUNTER — Other Ambulatory Visit: Payer: Self-pay

## 2023-10-13 DIAGNOSIS — E1065 Type 1 diabetes mellitus with hyperglycemia: Secondary | ICD-10-CM | POA: Diagnosis not present

## 2023-10-14 ENCOUNTER — Other Ambulatory Visit (HOSPITAL_COMMUNITY): Payer: Self-pay

## 2023-10-14 MED ORDER — DEXCOM G6 TRANSMITTER MISC
1.0000 | 3 refills | Status: AC
  Filled 2023-10-14: qty 1, 90d supply, fill #0
  Filled 2023-10-14: qty 1, 1d supply, fill #0
  Filled 2023-11-16 – 2024-01-15 (×2): qty 1, 90d supply, fill #1

## 2023-10-14 MED ORDER — DEXCOM G6 SENSOR MISC
1.0000 | 11 refills | Status: AC
  Filled 2023-10-14 (×2): qty 3, 30d supply, fill #0
  Filled 2023-11-16: qty 3, 30d supply, fill #1
  Filled 2023-12-17: qty 3, 30d supply, fill #2
  Filled 2024-01-15: qty 3, 30d supply, fill #3
  Filled 2024-02-11: qty 3, 30d supply, fill #4
  Filled 2024-03-06: qty 3, 30d supply, fill #5

## 2023-10-15 ENCOUNTER — Other Ambulatory Visit (HOSPITAL_COMMUNITY): Payer: Self-pay

## 2023-10-16 ENCOUNTER — Encounter (HOSPITAL_COMMUNITY): Payer: Self-pay

## 2023-10-16 ENCOUNTER — Other Ambulatory Visit (HOSPITAL_COMMUNITY): Payer: Self-pay

## 2023-10-16 DIAGNOSIS — L0291 Cutaneous abscess, unspecified: Secondary | ICD-10-CM | POA: Diagnosis not present

## 2023-10-18 ENCOUNTER — Other Ambulatory Visit: Payer: Self-pay | Admitting: Psychiatry

## 2023-10-19 ENCOUNTER — Other Ambulatory Visit (HOSPITAL_COMMUNITY): Payer: Self-pay

## 2023-10-19 ENCOUNTER — Other Ambulatory Visit: Payer: Self-pay

## 2023-10-20 ENCOUNTER — Encounter: Payer: Self-pay | Admitting: Pharmacist

## 2023-10-20 ENCOUNTER — Other Ambulatory Visit: Payer: Self-pay

## 2023-10-20 ENCOUNTER — Other Ambulatory Visit (HOSPITAL_COMMUNITY): Payer: Self-pay

## 2023-10-21 DIAGNOSIS — Z3490 Encounter for supervision of normal pregnancy, unspecified, unspecified trimester: Secondary | ICD-10-CM | POA: Diagnosis not present

## 2023-10-21 DIAGNOSIS — O99211 Obesity complicating pregnancy, first trimester: Secondary | ICD-10-CM | POA: Diagnosis not present

## 2023-10-21 DIAGNOSIS — O24011 Pre-existing diabetes mellitus, type 1, in pregnancy, first trimester: Secondary | ICD-10-CM | POA: Diagnosis not present

## 2023-10-21 DIAGNOSIS — Z3A09 9 weeks gestation of pregnancy: Secondary | ICD-10-CM | POA: Diagnosis not present

## 2023-10-21 DIAGNOSIS — O34219 Maternal care for unspecified type scar from previous cesarean delivery: Secondary | ICD-10-CM | POA: Diagnosis not present

## 2023-10-21 DIAGNOSIS — Z3689 Encounter for other specified antenatal screening: Secondary | ICD-10-CM | POA: Diagnosis not present

## 2023-10-26 DIAGNOSIS — E1065 Type 1 diabetes mellitus with hyperglycemia: Secondary | ICD-10-CM | POA: Diagnosis not present

## 2023-10-29 DIAGNOSIS — Z98891 History of uterine scar from previous surgery: Secondary | ICD-10-CM | POA: Diagnosis not present

## 2023-10-29 DIAGNOSIS — O99321 Drug use complicating pregnancy, first trimester: Secondary | ICD-10-CM | POA: Diagnosis not present

## 2023-10-29 DIAGNOSIS — O09891 Supervision of other high risk pregnancies, first trimester: Secondary | ICD-10-CM | POA: Diagnosis not present

## 2023-10-29 DIAGNOSIS — Z3A10 10 weeks gestation of pregnancy: Secondary | ICD-10-CM | POA: Diagnosis not present

## 2023-10-29 DIAGNOSIS — O99210 Obesity complicating pregnancy, unspecified trimester: Secondary | ICD-10-CM | POA: Diagnosis not present

## 2023-10-29 DIAGNOSIS — O99320 Drug use complicating pregnancy, unspecified trimester: Secondary | ICD-10-CM | POA: Diagnosis not present

## 2023-10-29 DIAGNOSIS — R768 Other specified abnormal immunological findings in serum: Secondary | ICD-10-CM | POA: Diagnosis not present

## 2023-10-29 DIAGNOSIS — Z720 Tobacco use: Secondary | ICD-10-CM | POA: Diagnosis not present

## 2023-10-29 DIAGNOSIS — O24319 Unspecified pre-existing diabetes mellitus in pregnancy, unspecified trimester: Secondary | ICD-10-CM | POA: Diagnosis not present

## 2023-10-29 DIAGNOSIS — O99211 Obesity complicating pregnancy, first trimester: Secondary | ICD-10-CM | POA: Diagnosis not present

## 2023-10-29 DIAGNOSIS — F1290 Cannabis use, unspecified, uncomplicated: Secondary | ICD-10-CM | POA: Diagnosis not present

## 2023-10-29 DIAGNOSIS — O24311 Unspecified pre-existing diabetes mellitus in pregnancy, first trimester: Secondary | ICD-10-CM | POA: Diagnosis not present

## 2023-10-30 ENCOUNTER — Other Ambulatory Visit (HOSPITAL_COMMUNITY): Payer: Self-pay

## 2023-10-30 ENCOUNTER — Other Ambulatory Visit: Payer: Self-pay

## 2023-10-30 MED ORDER — NICOTINE POLACRILEX 2 MG MT GUM
2.0000 mg | CHEWING_GUM | OROMUCOSAL | 0 refills | Status: AC | PRN
  Filled 2023-10-30: qty 110, 30d supply, fill #0

## 2023-11-07 ENCOUNTER — Other Ambulatory Visit (HOSPITAL_COMMUNITY): Payer: Self-pay

## 2023-11-09 ENCOUNTER — Other Ambulatory Visit: Payer: Self-pay

## 2023-11-10 ENCOUNTER — Other Ambulatory Visit (HOSPITAL_COMMUNITY): Payer: Self-pay

## 2023-11-10 ENCOUNTER — Other Ambulatory Visit: Payer: Self-pay

## 2023-11-10 MED ORDER — ASPIRIN 81 MG PO TBEC
81.0000 mg | DELAYED_RELEASE_TABLET | Freq: Every day | ORAL | 11 refills | Status: AC
  Filled 2023-11-10: qty 30, 30d supply, fill #0
  Filled 2023-11-16: qty 30, 30d supply, fill #1
  Filled 2024-01-05: qty 30, 30d supply, fill #2
  Filled 2024-02-04: qty 30, 30d supply, fill #3
  Filled 2024-03-02: qty 30, 30d supply, fill #4
  Filled 2024-04-02: qty 90, 90d supply, fill #5

## 2023-11-12 ENCOUNTER — Other Ambulatory Visit: Payer: Self-pay

## 2023-11-12 ENCOUNTER — Other Ambulatory Visit (HOSPITAL_COMMUNITY): Payer: Self-pay

## 2023-11-12 MED ORDER — ONDANSETRON 4 MG PO TBDP
4.0000 mg | ORAL_TABLET | Freq: Three times a day (TID) | ORAL | 0 refills | Status: AC | PRN
  Filled 2023-11-12: qty 20, 7d supply, fill #0

## 2023-11-16 ENCOUNTER — Other Ambulatory Visit (HOSPITAL_COMMUNITY): Payer: Self-pay

## 2023-11-16 ENCOUNTER — Other Ambulatory Visit: Payer: Self-pay | Admitting: Psychiatry

## 2023-11-16 DIAGNOSIS — Z3491 Encounter for supervision of normal pregnancy, unspecified, first trimester: Secondary | ICD-10-CM | POA: Diagnosis not present

## 2023-11-16 DIAGNOSIS — E1065 Type 1 diabetes mellitus with hyperglycemia: Secondary | ICD-10-CM | POA: Diagnosis not present

## 2023-11-17 ENCOUNTER — Other Ambulatory Visit: Payer: Self-pay

## 2023-11-24 ENCOUNTER — Other Ambulatory Visit: Payer: Self-pay

## 2023-11-26 ENCOUNTER — Other Ambulatory Visit (HOSPITAL_COMMUNITY): Payer: Self-pay

## 2023-11-26 ENCOUNTER — Other Ambulatory Visit: Payer: Self-pay

## 2023-11-26 DIAGNOSIS — O99332 Smoking (tobacco) complicating pregnancy, second trimester: Secondary | ICD-10-CM | POA: Diagnosis not present

## 2023-11-26 DIAGNOSIS — O99320 Drug use complicating pregnancy, unspecified trimester: Secondary | ICD-10-CM | POA: Diagnosis not present

## 2023-11-26 DIAGNOSIS — O24312 Unspecified pre-existing diabetes mellitus in pregnancy, second trimester: Secondary | ICD-10-CM | POA: Diagnosis not present

## 2023-11-26 DIAGNOSIS — Z794 Long term (current) use of insulin: Secondary | ICD-10-CM | POA: Diagnosis not present

## 2023-11-26 DIAGNOSIS — O99322 Drug use complicating pregnancy, second trimester: Secondary | ICD-10-CM | POA: Diagnosis not present

## 2023-11-26 DIAGNOSIS — E1169 Type 2 diabetes mellitus with other specified complication: Secondary | ICD-10-CM | POA: Diagnosis not present

## 2023-11-26 DIAGNOSIS — Z98891 History of uterine scar from previous surgery: Secondary | ICD-10-CM | POA: Diagnosis not present

## 2023-11-26 DIAGNOSIS — F1290 Cannabis use, unspecified, uncomplicated: Secondary | ICD-10-CM | POA: Diagnosis not present

## 2023-11-26 DIAGNOSIS — O34219 Maternal care for unspecified type scar from previous cesarean delivery: Secondary | ICD-10-CM | POA: Diagnosis not present

## 2023-11-26 DIAGNOSIS — B009 Herpesviral infection, unspecified: Secondary | ICD-10-CM | POA: Diagnosis not present

## 2023-11-26 DIAGNOSIS — Z3A14 14 weeks gestation of pregnancy: Secondary | ICD-10-CM | POA: Diagnosis not present

## 2023-11-26 DIAGNOSIS — O98812 Other maternal infectious and parasitic diseases complicating pregnancy, second trimester: Secondary | ICD-10-CM | POA: Diagnosis not present

## 2023-11-26 DIAGNOSIS — Z720 Tobacco use: Secondary | ICD-10-CM | POA: Diagnosis not present

## 2023-11-26 DIAGNOSIS — Z23 Encounter for immunization: Secondary | ICD-10-CM | POA: Diagnosis not present

## 2023-11-26 DIAGNOSIS — O99210 Obesity complicating pregnancy, unspecified trimester: Secondary | ICD-10-CM | POA: Diagnosis not present

## 2023-11-26 DIAGNOSIS — Z7984 Long term (current) use of oral hypoglycemic drugs: Secondary | ICD-10-CM | POA: Diagnosis not present

## 2023-11-26 DIAGNOSIS — R768 Other specified abnormal immunological findings in serum: Secondary | ICD-10-CM | POA: Diagnosis not present

## 2023-11-26 MED ORDER — NICOTINE POLACRILEX 4 MG MT GUM
4.0000 mg | CHEWING_GUM | OROMUCOSAL | 0 refills | Status: DC | PRN
  Filled 2023-11-26: qty 200, 30d supply, fill #0

## 2023-11-27 ENCOUNTER — Other Ambulatory Visit: Payer: Self-pay

## 2023-11-28 ENCOUNTER — Other Ambulatory Visit (HOSPITAL_COMMUNITY): Payer: Self-pay

## 2023-11-30 ENCOUNTER — Other Ambulatory Visit: Payer: Self-pay

## 2023-11-30 ENCOUNTER — Other Ambulatory Visit (HOSPITAL_COMMUNITY): Payer: Self-pay

## 2023-11-30 DIAGNOSIS — E1065 Type 1 diabetes mellitus with hyperglycemia: Secondary | ICD-10-CM | POA: Diagnosis not present

## 2023-11-30 MED ORDER — NICOTINE POLACRILEX 4 MG MT GUM
CHEWING_GUM | OROMUCOSAL | 0 refills | Status: AC
  Filled 2023-11-30: qty 200, 30d supply, fill #0
  Filled 2023-12-17: qty 200, 50d supply, fill #0
  Filled 2024-01-05: qty 200, 30d supply, fill #0

## 2023-12-05 ENCOUNTER — Other Ambulatory Visit (HOSPITAL_COMMUNITY): Payer: Self-pay

## 2023-12-17 ENCOUNTER — Other Ambulatory Visit: Payer: Self-pay

## 2023-12-17 ENCOUNTER — Other Ambulatory Visit (HOSPITAL_COMMUNITY): Payer: Self-pay

## 2023-12-18 ENCOUNTER — Other Ambulatory Visit (HOSPITAL_COMMUNITY): Payer: Self-pay

## 2023-12-18 ENCOUNTER — Other Ambulatory Visit: Payer: Self-pay

## 2023-12-19 ENCOUNTER — Other Ambulatory Visit (HOSPITAL_COMMUNITY): Payer: Self-pay

## 2023-12-19 MED ORDER — GVOKE HYPOPEN 2-PACK 1 MG/0.2ML ~~LOC~~ SOAJ
SUBCUTANEOUS | 3 refills | Status: AC
  Filled 2023-12-19: qty 0.4, 30d supply, fill #0
  Filled 2024-01-20: qty 0.4, 30d supply, fill #1
  Filled 2024-03-02: qty 0.4, 30d supply, fill #2
  Filled 2024-04-02: qty 0.4, 30d supply, fill #3

## 2023-12-21 ENCOUNTER — Other Ambulatory Visit: Payer: Self-pay

## 2023-12-21 ENCOUNTER — Other Ambulatory Visit (HOSPITAL_COMMUNITY): Payer: Self-pay

## 2023-12-21 DIAGNOSIS — Z3482 Encounter for supervision of other normal pregnancy, second trimester: Secondary | ICD-10-CM | POA: Diagnosis not present

## 2023-12-21 DIAGNOSIS — Z3483 Encounter for supervision of other normal pregnancy, third trimester: Secondary | ICD-10-CM | POA: Diagnosis not present

## 2023-12-24 ENCOUNTER — Other Ambulatory Visit: Payer: Self-pay

## 2023-12-24 ENCOUNTER — Other Ambulatory Visit (HOSPITAL_COMMUNITY): Payer: Self-pay

## 2023-12-24 MED ORDER — SERTRALINE HCL 50 MG PO TABS
50.0000 mg | ORAL_TABLET | Freq: Every day | ORAL | 0 refills | Status: AC
  Filled 2023-12-24: qty 90, 90d supply, fill #0

## 2023-12-29 ENCOUNTER — Other Ambulatory Visit (HOSPITAL_COMMUNITY): Payer: Self-pay

## 2023-12-29 MED ORDER — FIASP 100 UNIT/ML IJ SOLN
140.0000 [IU] | Freq: Every day | INTRAMUSCULAR | 5 refills | Status: AC
  Filled 2023-12-29 – 2024-01-14 (×4): qty 50, 35d supply, fill #0
  Filled 2024-02-13 – 2024-03-07 (×5): qty 50, 35d supply, fill #1
  Filled 2024-04-02: qty 50, 35d supply, fill #2

## 2023-12-31 ENCOUNTER — Other Ambulatory Visit: Payer: Self-pay

## 2023-12-31 ENCOUNTER — Other Ambulatory Visit (HOSPITAL_COMMUNITY): Payer: Self-pay

## 2024-01-06 ENCOUNTER — Other Ambulatory Visit (HOSPITAL_COMMUNITY): Payer: Self-pay

## 2024-01-06 ENCOUNTER — Other Ambulatory Visit: Payer: Self-pay

## 2024-01-07 DIAGNOSIS — E109 Type 1 diabetes mellitus without complications: Secondary | ICD-10-CM | POA: Diagnosis not present

## 2024-01-07 DIAGNOSIS — O99342 Other mental disorders complicating pregnancy, second trimester: Secondary | ICD-10-CM | POA: Diagnosis not present

## 2024-01-07 DIAGNOSIS — Z98891 History of uterine scar from previous surgery: Secondary | ICD-10-CM | POA: Diagnosis not present

## 2024-01-07 DIAGNOSIS — O99212 Obesity complicating pregnancy, second trimester: Secondary | ICD-10-CM | POA: Diagnosis not present

## 2024-01-07 DIAGNOSIS — O24012 Pre-existing diabetes mellitus, type 1, in pregnancy, second trimester: Secondary | ICD-10-CM | POA: Diagnosis not present

## 2024-01-07 DIAGNOSIS — O34219 Maternal care for unspecified type scar from previous cesarean delivery: Secondary | ICD-10-CM | POA: Diagnosis not present

## 2024-01-07 DIAGNOSIS — O24312 Unspecified pre-existing diabetes mellitus in pregnancy, second trimester: Secondary | ICD-10-CM | POA: Diagnosis not present

## 2024-01-07 DIAGNOSIS — Z3A20 20 weeks gestation of pregnancy: Secondary | ICD-10-CM | POA: Diagnosis not present

## 2024-01-07 DIAGNOSIS — F419 Anxiety disorder, unspecified: Secondary | ICD-10-CM | POA: Diagnosis not present

## 2024-01-07 DIAGNOSIS — Z363 Encounter for antenatal screening for malformations: Secondary | ICD-10-CM | POA: Diagnosis not present

## 2024-01-07 DIAGNOSIS — O99210 Obesity complicating pregnancy, unspecified trimester: Secondary | ICD-10-CM | POA: Diagnosis not present

## 2024-01-14 ENCOUNTER — Other Ambulatory Visit: Payer: Self-pay

## 2024-01-14 ENCOUNTER — Other Ambulatory Visit (HOSPITAL_COMMUNITY): Payer: Self-pay

## 2024-01-15 ENCOUNTER — Other Ambulatory Visit (HOSPITAL_BASED_OUTPATIENT_CLINIC_OR_DEPARTMENT_OTHER): Payer: Self-pay

## 2024-01-20 ENCOUNTER — Other Ambulatory Visit: Payer: Self-pay

## 2024-02-05 ENCOUNTER — Other Ambulatory Visit: Payer: Self-pay

## 2024-02-08 DIAGNOSIS — O24312 Unspecified pre-existing diabetes mellitus in pregnancy, second trimester: Secondary | ICD-10-CM | POA: Diagnosis not present

## 2024-02-08 DIAGNOSIS — Z3A25 25 weeks gestation of pregnancy: Secondary | ICD-10-CM | POA: Diagnosis not present

## 2024-02-08 DIAGNOSIS — F419 Anxiety disorder, unspecified: Secondary | ICD-10-CM | POA: Diagnosis not present

## 2024-02-08 DIAGNOSIS — Z362 Encounter for other antenatal screening follow-up: Secondary | ICD-10-CM | POA: Diagnosis not present

## 2024-02-08 DIAGNOSIS — O24012 Pre-existing diabetes mellitus, type 1, in pregnancy, second trimester: Secondary | ICD-10-CM | POA: Diagnosis not present

## 2024-02-08 DIAGNOSIS — O34219 Maternal care for unspecified type scar from previous cesarean delivery: Secondary | ICD-10-CM | POA: Diagnosis not present

## 2024-02-08 DIAGNOSIS — O99210 Obesity complicating pregnancy, unspecified trimester: Secondary | ICD-10-CM | POA: Diagnosis not present

## 2024-02-08 DIAGNOSIS — E109 Type 1 diabetes mellitus without complications: Secondary | ICD-10-CM | POA: Diagnosis not present

## 2024-02-08 DIAGNOSIS — O99342 Other mental disorders complicating pregnancy, second trimester: Secondary | ICD-10-CM | POA: Diagnosis not present

## 2024-02-08 DIAGNOSIS — O99212 Obesity complicating pregnancy, second trimester: Secondary | ICD-10-CM | POA: Diagnosis not present

## 2024-02-11 ENCOUNTER — Other Ambulatory Visit: Payer: Self-pay

## 2024-02-11 ENCOUNTER — Other Ambulatory Visit (HOSPITAL_COMMUNITY): Payer: Self-pay

## 2024-02-14 ENCOUNTER — Other Ambulatory Visit (HOSPITAL_COMMUNITY): Payer: Self-pay

## 2024-02-29 ENCOUNTER — Other Ambulatory Visit: Payer: Self-pay

## 2024-03-02 ENCOUNTER — Other Ambulatory Visit: Payer: Self-pay

## 2024-03-05 ENCOUNTER — Other Ambulatory Visit (HOSPITAL_COMMUNITY): Payer: Self-pay

## 2024-03-07 ENCOUNTER — Other Ambulatory Visit (HOSPITAL_COMMUNITY): Payer: Self-pay

## 2024-03-07 ENCOUNTER — Other Ambulatory Visit: Payer: Self-pay

## 2024-03-07 MED ORDER — FIASP FLEXTOUCH 100 UNIT/ML ~~LOC~~ SOPN
10.0000 [IU] | PEN_INJECTOR | Freq: Three times a day (TID) | SUBCUTANEOUS | 5 refills | Status: AC
  Filled 2024-03-07: qty 15, 33d supply, fill #0

## 2024-03-09 ENCOUNTER — Other Ambulatory Visit (HOSPITAL_COMMUNITY): Payer: Self-pay

## 2024-03-11 ENCOUNTER — Other Ambulatory Visit (HOSPITAL_COMMUNITY): Payer: Self-pay

## 2024-03-11 ENCOUNTER — Other Ambulatory Visit: Payer: Self-pay

## 2024-03-11 MED ORDER — ONDANSETRON 4 MG PO TBDP
4.0000 mg | ORAL_TABLET | Freq: Four times a day (QID) | ORAL | 1 refills | Status: AC | PRN
  Filled 2024-03-11: qty 30, 8d supply, fill #0
  Filled 2024-03-25: qty 30, 8d supply, fill #1

## 2024-03-11 MED ORDER — METRONIDAZOLE 0.75 % VA GEL
VAGINAL | 0 refills | Status: AC
  Filled 2024-03-11: qty 70, 7d supply, fill #0

## 2024-03-13 ENCOUNTER — Other Ambulatory Visit: Payer: Self-pay

## 2024-03-14 ENCOUNTER — Other Ambulatory Visit: Payer: Self-pay

## 2024-03-18 ENCOUNTER — Other Ambulatory Visit: Payer: Self-pay

## 2024-03-18 ENCOUNTER — Other Ambulatory Visit (HOSPITAL_COMMUNITY): Payer: Self-pay

## 2024-03-18 MED ORDER — OMNIPOD 5 DEXG7G6 PODS GEN 5 MISC
5 refills | Status: AC
  Filled 2024-03-18: qty 15, 30d supply, fill #0

## 2024-03-25 ENCOUNTER — Other Ambulatory Visit: Payer: Self-pay

## 2024-04-03 ENCOUNTER — Other Ambulatory Visit (HOSPITAL_COMMUNITY): Payer: Self-pay

## 2024-04-03 ENCOUNTER — Other Ambulatory Visit: Payer: Self-pay

## 2024-04-07 ENCOUNTER — Other Ambulatory Visit (HOSPITAL_COMMUNITY): Payer: Self-pay

## 2024-04-07 MED ORDER — TRESIBA FLEXTOUCH 200 UNIT/ML ~~LOC~~ SOPN
PEN_INJECTOR | SUBCUTANEOUS | 5 refills | Status: AC
  Filled 2024-04-07: qty 24, 89d supply, fill #0
